# Patient Record
Sex: Female | Born: 1995 | Race: Black or African American | Hispanic: No | Marital: Single | Smoking: Former smoker
Health system: Southern US, Community
[De-identification: ages and names within clinical notes are randomized; demographics above are authoritative.]

## PROBLEM LIST (undated history)

## (undated) ENCOUNTER — Inpatient Hospital Stay (HOSPITAL_COMMUNITY): Payer: Self-pay

## (undated) DIAGNOSIS — R569 Unspecified convulsions: Secondary | ICD-10-CM

## (undated) DIAGNOSIS — J45909 Unspecified asthma, uncomplicated: Secondary | ICD-10-CM

## (undated) DIAGNOSIS — G43909 Migraine, unspecified, not intractable, without status migrainosus: Secondary | ICD-10-CM

## (undated) HISTORY — DX: Unspecified asthma, uncomplicated: J45.909

## (undated) HISTORY — PX: OTHER SURGICAL HISTORY: SHX169

## (undated) HISTORY — DX: Unspecified convulsions: R56.9

---
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
## 2014-05-06 ENCOUNTER — Encounter (HOSPITAL_COMMUNITY): Payer: Self-pay | Admitting: Emergency Medicine

## 2014-05-06 ENCOUNTER — Emergency Department (INDEPENDENT_AMBULATORY_CARE_PROVIDER_SITE_OTHER)
Admission: EM | Admit: 2014-05-06 | Discharge: 2014-05-06 | Disposition: A | Discharge status: Home / Self Care | Payer: Medicaid Other | Source: Home / Self Care | Attending: Family Medicine | Admitting: Family Medicine

## 2014-05-06 DIAGNOSIS — G44229 Chronic tension-type headache, not intractable: Secondary | ICD-10-CM

## 2014-05-06 LAB — POCT URINALYSIS DIP (DEVICE)
BILIRUBIN URINE: NEGATIVE
GLUCOSE, UA: NEGATIVE mg/dL
KETONES UR: NEGATIVE mg/dL
Leukocytes, UA: NEGATIVE
Nitrite: NEGATIVE
Protein, ur: NEGATIVE mg/dL
SPECIFIC GRAVITY, URINE: 1.02 (ref 1.005–1.030)
UROBILINOGEN UA: 0.2 mg/dL (ref 0.0–1.0)
pH: 7 (ref 5.0–8.0)

## 2014-05-06 LAB — POCT PREGNANCY, URINE: PREG TEST UR: NEGATIVE

## 2014-05-06 NOTE — ED Notes (Addendum)
C/o headache and frequent urination.  Headache has been worse for the past 2 days.  And urinary symptoms have been present for 2 weeks

## 2014-05-06 NOTE — ED Provider Notes (Signed)
CSN: [id number]     Arrival date & time 05/06/14  1621 History   First MD Initiated Contact with Patient 05/06/14 1703     Chief Complaint  Patient presents with  . Headache  . Urinary Frequency   (Consider location/radiation/quality/duration/timing/severity/associated sxs/prior Treatment) Patient is a 18 y.o. female presenting with headaches. The history is provided by the patient.  Headache Pain location:  Generalized Radiates to:  Does not radiate Progression:  Worsening Chronicity:  Chronic (daily.) Similar to prior headaches: yes   Context: caffeine and emotional stress   Context comment:  Admits to xs caffeine, , is [employer]. Associated symptoms: no blurred vision, no cough, no fever, no loss of balance and no numbness     History reviewed. No pertinent past medical history. History reviewed. No pertinent past surgical history. No family history on file. History  Substance Use Topics  . Smoking status: Never Smoker   . Smokeless tobacco: Not on file  . Alcohol Use: No   OB History   Grav Para Term Preterm Abortions TAB SAB Ect Mult Living                 Review of Systems  Constitutional: Negative.  Negative for fever.  Eyes: Negative for blurred vision.  Respiratory: Negative for cough.   Cardiovascular: Negative.   Gastrointestinal: Negative.   Genitourinary: Positive for frequency. Negative for urgency.  Neurological: Positive for headaches. Negative for weakness, light-headedness, numbness and loss of balance.    Allergies  Review of patient's allergies indicates no known allergies.  Home Medications   Prior to Admission medications   Medication Sig Start Date End Date Taking? Authorizing Provider  aspirin-acetaminophen-caffeine (EXCEDRIN MIGRAINE) [phone] MG per tablet Take by mouth every 6 (six) hours as needed for headache.   Yes Historical Provider, MD  albuterol (PROVENTIL HFA;VENTOLIN HFA) 108 (90 BASE) MCG/ACT inhaler Inhale 2 puffs  into the lungs 4 (four) times daily. 02/22/13   Reuben Likesavid C Keller, MD  fexofenadine-pseudoephedrine (ALLEGRA-D) 60-120 MG per tablet Take 1 tablet by mouth every 12 (twelve) hours. 02/22/13   Reuben Likesavid C Keller, MD  fluticasone (FLONASE) 50 MCG/ACT nasal spray Place 2 sprays into the nose daily. 02/22/13   Reuben Likesavid C Keller, MD  ipratropium (ATROVENT) 0.06 % nasal spray Place 2 sprays into both nostrils 4 (four) times daily. 08/26/13   Linna HoffJames D Raydan Schlabach, MD  loratadine (CLARITIN) 10 MG tablet Take 1 tablet (10 mg total) by mouth daily. 12/11/12   Arley Pheniximothy M Galey, MD   BP 126/79  Pulse 76  Temp(Src) 99.8 F (37.7 C) (Oral)  Resp 14  SpO2 98% Physical Exam  Nursing note and vitals reviewed. Constitutional: She is oriented to person, place, and time. She appears well-developed and well-nourished.  HENT:  Right Ear: External ear normal.  Eyes: Conjunctivae and EOM are normal. Pupils are equal, round, and reactive to light.  Neck: Normal range of motion. Neck supple. No thyromegaly present.  Cardiovascular: Normal heart sounds.   Pulmonary/Chest: Breath sounds normal.  Lymphadenopathy:    She has no cervical adenopathy.  Neurological: She is alert and oriented to person, place, and time. No cranial nerve deficit. She exhibits normal muscle tone. Coordination normal.  Skin: Skin is warm and dry.    ED Course  Procedures (including critical care time) Labs Review Labs Reviewed  POCT URINALYSIS DIP (DEVICE) - Abnormal; Notable for the following:    Hgb urine dipstick MODERATE (*)    All other components within normal limits  POCT PREGNANCY, URINE    Imaging Review No results found.   MDM   1. Chronic tension-type headache, not intractable        Linna HoffJames D Alyanah Elliott, MD 05/06/14 [phone]

## 2014-05-06 NOTE — Discharge Instructions (Signed)
Reduce caffeine, increase water, get more sleep, see your doctor as needed.

## 2014-07-29 ENCOUNTER — Emergency Department (HOSPITAL_COMMUNITY)
Admission: EM | Admit: 2014-07-29 | Discharge: 2014-07-30 | Discharge status: Against Medical Advice | Payer: Medicaid Other | Attending: Emergency Medicine | Admitting: Emergency Medicine

## 2014-07-29 ENCOUNTER — Encounter (HOSPITAL_COMMUNITY): Payer: Self-pay | Admitting: Emergency Medicine

## 2014-07-29 DIAGNOSIS — R1030 Lower abdominal pain, unspecified: Secondary | ICD-10-CM | POA: Insufficient documentation

## 2014-07-29 DIAGNOSIS — N72 Inflammatory disease of cervix uteri: Secondary | ICD-10-CM | POA: Diagnosis not present

## 2014-07-29 LAB — CBC WITH DIFFERENTIAL/PLATELET
BASOS PCT: 0 % (ref 0–1)
Basophils Absolute: 0 10*3/uL (ref 0.0–0.1)
Eosinophils Absolute: 0.1 10*3/uL (ref 0.0–0.7)
Eosinophils Relative: 2 % (ref 0–5)
HCT: 39.6 % (ref 36.0–46.0)
HEMOGLOBIN: 13.1 g/dL (ref 12.0–15.0)
LYMPHS ABS: 3 10*3/uL (ref 0.7–4.0)
LYMPHS PCT: 44 % (ref 12–46)
MCH: 27.9 pg (ref 26.0–34.0)
MCHC: 33.1 g/dL (ref 30.0–36.0)
MCV: 84.3 fL (ref 78.0–100.0)
MONOS PCT: 9 % (ref 3–12)
Monocytes Absolute: 0.6 10*3/uL (ref 0.1–1.0)
NEUTROS ABS: 3 10*3/uL (ref 1.7–7.7)
NEUTROS PCT: 45 % (ref 43–77)
Platelets: 267 10*3/uL (ref 150–400)
RBC: 4.7 MIL/uL (ref 3.87–5.11)
RDW: 12.7 % (ref 11.5–15.5)
WBC: 6.8 10*3/uL (ref 4.0–10.5)

## 2014-07-29 LAB — COMPREHENSIVE METABOLIC PANEL
ALBUMIN: 4 g/dL (ref 3.5–5.2)
ALK PHOS: 54 U/L (ref 39–117)
ALT: 15 U/L (ref 0–35)
ANION GAP: 9 (ref 5–15)
AST: 21 U/L (ref 0–37)
BILIRUBIN TOTAL: 0.4 mg/dL (ref 0.3–1.2)
BUN: 14 mg/dL (ref 6–23)
CO2: 18 mmol/L — ABNORMAL LOW (ref 19–32)
Calcium: 9.3 mg/dL (ref 8.4–10.5)
Chloride: 111 mEq/L (ref 96–112)
Creatinine, Ser: 0.82 mg/dL (ref 0.50–1.10)
Glucose, Bld: 99 mg/dL (ref 70–99)
POTASSIUM: 4 mmol/L (ref 3.5–5.1)
Sodium: 138 mmol/L (ref 135–145)
TOTAL PROTEIN: 7 g/dL (ref 6.0–8.3)

## 2014-07-29 LAB — URINALYSIS, ROUTINE W REFLEX MICROSCOPIC
BILIRUBIN URINE: NEGATIVE
Glucose, UA: NEGATIVE mg/dL
KETONES UR: NEGATIVE mg/dL
LEUKOCYTES UA: NEGATIVE
Nitrite: NEGATIVE
PH: 7 (ref 5.0–8.0)
Protein, ur: NEGATIVE mg/dL
Specific Gravity, Urine: 1.029 (ref 1.005–1.030)
UROBILINOGEN UA: 1 mg/dL (ref 0.0–1.0)

## 2014-07-29 LAB — URINE MICROSCOPIC-ADD ON

## 2014-07-29 LAB — PREGNANCY, URINE: PREG TEST UR: NEGATIVE

## 2014-07-29 NOTE — ED Notes (Addendum)
No answer in waiting room.  Called pt on phone and she had went to get something to eat due to having 7 people in front of her.  Pt states she is returning at this time.

## 2014-07-29 NOTE — ED Notes (Signed)
Pt. reports low abdominal pain with  urinary frequency onset 2 days ago , denies nausea or vomitting , no fever or diarrhea.

## 2014-07-29 NOTE — ED Notes (Signed)
No answer in waiting area.

## 2014-07-30 ENCOUNTER — Emergency Department (HOSPITAL_COMMUNITY)
Admission: EM | Admit: 2014-07-30 | Discharge: 2014-07-30 | Disposition: A | Discharge status: Home / Self Care | Payer: Medicaid Other | Attending: Emergency Medicine | Admitting: Emergency Medicine

## 2014-07-30 ENCOUNTER — Encounter (HOSPITAL_COMMUNITY): Payer: Self-pay | Admitting: Emergency Medicine

## 2014-07-30 DIAGNOSIS — R350 Frequency of micturition: Secondary | ICD-10-CM | POA: Insufficient documentation

## 2014-07-30 DIAGNOSIS — Z79899 Other long term (current) drug therapy: Secondary | ICD-10-CM | POA: Insufficient documentation

## 2014-07-30 DIAGNOSIS — R509 Fever, unspecified: Secondary | ICD-10-CM | POA: Diagnosis not present

## 2014-07-30 DIAGNOSIS — Z7951 Long term (current) use of inhaled steroids: Secondary | ICD-10-CM | POA: Insufficient documentation

## 2014-07-30 DIAGNOSIS — Z7982 Long term (current) use of aspirin: Secondary | ICD-10-CM | POA: Diagnosis not present

## 2014-07-30 DIAGNOSIS — N72 Inflammatory disease of cervix uteri: Secondary | ICD-10-CM | POA: Insufficient documentation

## 2014-07-30 DIAGNOSIS — R1030 Lower abdominal pain, unspecified: Secondary | ICD-10-CM | POA: Diagnosis present

## 2014-07-30 LAB — WET PREP, GENITAL
TRICH WET PREP: NONE SEEN
Yeast Wet Prep HPF POC: NONE SEEN

## 2014-07-30 MED ORDER — DOXYCYCLINE HYCLATE 100 MG PO CAPS: 100.0000 mg | ORAL_CAPSULE | Freq: Two times a day (BID) | ORAL | Status: DC

## 2014-07-30 MED ORDER — CEFTRIAXONE SODIUM 250 MG IJ SOLR
250.0000 mg | Freq: Once | INTRAMUSCULAR | Status: AC
  Administered 2014-07-30: 250 mg via INTRAMUSCULAR
  Filled 2014-07-30: qty 250

## 2014-07-30 MED ORDER — LIDOCAINE HCL (PF) 1 % IJ SOLN
5.0000 mL | Freq: Once | INTRAMUSCULAR | Status: AC
  Administered 2014-07-30: 5 mL
  Filled 2014-07-30: qty 5

## 2014-07-30 NOTE — ED Notes (Signed)
No answer in waiting room.  Pt had been informed to let nurse know when she returned.

## 2014-07-30 NOTE — ED Provider Notes (Signed)
CSN: [id number]     Arrival date & time 07/30/14  0243 History  This chart was scribed for Loren Raceravid Reinhold Rickey, MD by Annye AsaAnna Dorsett, ED Scribe. This patient was seen in room A06C/A06C and the patient's care was started at 2:47 AM.    Chief Complaint  Patient presents with  . Abdominal Pain   HPI   HPI Comments: Natasha Martinez is a 19 y.o. female who presents to the Emergency Department complaining of 4 days of intermittent low abdominal pain. She also reports increased urinary frequency but denies dysuria, urgency or hematuria.. Nothing makes symptoms better or worse. Denies vaginal bleeding or discharge.  Patient recently had a "cold" with fevers and chills, but this has resolved at present.   History reviewed. No pertinent past medical history. History reviewed. No pertinent past surgical history. No family history on file. History  Substance Use Topics  . Smoking status: Never Smoker   . Smokeless tobacco: Not on file  . Alcohol Use: No   OB History    No data available     Review of Systems  Constitutional: Positive for fever. Negative for chills.  Respiratory: Negative for cough and shortness of breath.   Gastrointestinal: Positive for abdominal pain. Negative for nausea, vomiting, diarrhea and constipation.  Genitourinary: Positive for frequency. Negative for dysuria, hematuria, flank pain, vaginal bleeding, vaginal discharge, difficulty urinating and pelvic pain.  Musculoskeletal: Negative for back pain, neck pain and neck stiffness.  Neurological: Negative for dizziness, weakness, light-headedness, numbness and headaches.  All other systems reviewed and are negative.     Allergies  Review of patient's allergies indicates no known allergies.  Home Medications   Prior to Admission medications   Medication Sig Start Date End Date Taking? Authorizing Provider  albuterol (PROVENTIL HFA;VENTOLIN HFA) 108 (90 BASE) MCG/ACT inhaler Inhale 2 puffs into the lungs 4 (four) times  daily. Patient taking differently: Inhale 2 puffs into the lungs every 6 (six) hours as needed for wheezing or shortness of breath.  02/22/13  Yes Reuben Likesavid C Keller, MD  aspirin-acetaminophen-caffeine (EXCEDRIN MIGRAINE) [phone] MG per tablet Take 1-2 tablets by mouth every 6 (six) hours as needed for headache.    Yes Historical Provider, MD  fluticasone (FLONASE) 50 MCG/ACT nasal spray Place 2 sprays into the nose daily. Patient taking differently: Place 2 sprays into the nose daily as needed for allergies.  02/22/13  Yes Reuben Likesavid C Keller, MD  ipratropium (ATROVENT) 0.06 % nasal spray Place 2 sprays into both nostrils 4 (four) times daily. Patient taking differently: Place 2 sprays into both nostrils 4 (four) times daily as needed for rhinitis.  08/26/13  Yes Linna HoffJames D Kindl, MD  loratadine (CLARITIN) 10 MG tablet Take 1 tablet (10 mg total) by mouth daily. Patient taking differently: Take 10 mg by mouth daily as needed for allergies.  12/11/12  Yes Arley Pheniximothy M Galey, MD  medroxyPROGESTERone (DEPO-PROVERA) 150 MG/ML injection Inject 150 mg into the muscle every 3 (three) months.   Yes Historical Provider, MD  doxycycline (VIBRAMYCIN) 100 MG capsule Take 1 capsule (100 mg total) by mouth 2 (two) times daily. One po bid x 7 days 07/30/14   Loren Raceravid Nysha Koplin, MD  fexofenadine-pseudoephedrine (ALLEGRA-D) 60-120 MG per tablet Take 1 tablet by mouth every 12 (twelve) hours. Patient not taking: Reported on 07/30/2014 02/22/13   Reuben Likesavid C Keller, MD   BP 106/66 mmHg  Pulse 79  Temp(Src) 99.2 F (37.3 C) (Oral)  Resp 20  SpO2 98% Physical Exam  Constitutional:  She is oriented to person, place, and time. She appears well-developed and well-nourished. No distress.  HENT:  Head: Normocephalic and atraumatic.  Mouth/Throat: Oropharynx is clear and moist.  Eyes: EOM are normal. Pupils are equal, round, and reactive to light.  Neck: Normal range of motion. Neck supple.  Cardiovascular: Normal rate and regular rhythm.    Pulmonary/Chest: Effort normal and breath sounds normal. No respiratory distress. She has no wheezes. She has no rales.  Abdominal: Soft. Bowel sounds are normal. She exhibits no distension and no mass. There is tenderness (very mild suprapubic tenderness with palpation. There is no rebound or guarding.). There is no rebound and no guarding.  Genitourinary:  Purulent discharge coming from the cervical os. No cervical motion tenderness. No adnexal tenderness.  Musculoskeletal: Normal range of motion. She exhibits no edema or tenderness.  No CVA tenderness bilaterally.  Neurological: She is alert and oriented to person, place, and time.  Skin: Skin is warm and dry. No rash noted. No erythema.  Psychiatric: She has a normal mood and affect. Her behavior is normal.  Nursing note and vitals reviewed.   ED Course  Procedures   DIAGNOSTIC STUDIES: Oxygen Saturation is 99% on RA, normal by my interpretation.    COORDINATION OF CARE: 2:50 AM Discussed treatment plan with pt at bedside and pt agreed to plan.   Labs Review Labs Reviewed  WET PREP, GENITAL - Abnormal; Notable for the following:    Clue Cells Wet Prep HPF POC FEW (*)    WBC, Wet Prep HPF POC MODERATE (*)    All other components within normal limits  GC/CHLAMYDIA PROBE AMP    Imaging Review No results found.   EKG Interpretation None      MDM   Final diagnoses:  Cervicitis    I personally performed the services described in this documentation, which was scribed in my presence. The recorded information has been reviewed and considered.  Advised to have all sexual partners treated and follow-up with health Department. Return precautions given.   Loren Racer, MD 07/30/14 [phone]

## 2014-07-30 NOTE — Discharge Instructions (Signed)
Cervicitis °Cervicitis is a soreness and swelling (inflammation) of the cervix. Your cervix is located at the bottom of your uterus. It opens up to the vagina. °CAUSES  °· Sexually transmitted infections (STIs).   °· Allergic reaction.   °· Medicines or birth control devices that are put in the vagina.   °· Injury to the cervix.   °· Bacterial infections.   °RISK FACTORS °You are at greater risk if you: °· Have unprotected sexual intercourse. °· Have sexual intercourse with many partners. °· Began sexual intercourse at an early age. °· Have a history of STIs. °SYMPTOMS  °There may be no symptoms. If symptoms occur, they may include:  °· Gray, white, yellow, or bad-smelling vaginal discharge.   °· Pain or itching of the area outside the vagina.   °· Painful sexual intercourse.   °· Lower abdominal or lower back pain, especially during intercourse.   °· Frequent urination.   °· Abnormal vaginal bleeding between periods, after sexual intercourse, or after menopause.   °· Pressure or a heavy feeling in the pelvis.   °DIAGNOSIS  °Diagnosis is made after a pelvic exam. Other tests may include:  °· Examination of any discharge under a microscope (wet prep).   °· A Pap test.   °TREATMENT  °Treatment will depend on the cause of cervicitis. If it is caused by an STI, both you and your partner will need to be treated. Antibiotic medicines will be given.  °HOME CARE INSTRUCTIONS  °· Do not have sexual intercourse until your health care provider says it is okay.   °· Do not have sexual intercourse until your partner has been treated, if your cervicitis is caused by an STI.   °· Take your antibiotics as directed. Finish them even if you start to feel better.   °SEEK MEDICAL CARE IF: °· Your symptoms come back.   °· You have a fever.   °MAKE SURE YOU:  °· Understand these instructions. °· Will watch your condition. °· Will get help right away if you are not doing well or get worse. °Document Released: 07/09/2005 Document Revised:  07/14/2013 Document Reviewed: 12/31/2012 °ExitCare® Patient Information ©2015 [employer]. This information is not intended to replace advice given to you by your health care provider. Make sure you discuss any questions you have with your health care provider. ° °

## 2014-07-30 NOTE — ED Notes (Signed)
Pt. reports low abdominal pain with urinary frequency for 2 days, pt. registered and triaged last night and left while waiting for room , blood tests and urine tests done while at triage .

## 2014-07-30 NOTE — ED Notes (Signed)
Pt has not returned

## 2014-07-31 LAB — GC/CHLAMYDIA PROBE AMP
CT Probe RNA: NEGATIVE
GC PROBE AMP APTIMA: NEGATIVE

## 2014-08-05 ENCOUNTER — Telehealth: Payer: Medicaid Other | Admitting: Physician Assistant

## 2014-08-05 DIAGNOSIS — O2340 Unspecified infection of urinary tract in pregnancy, unspecified trimester: Secondary | ICD-10-CM

## 2014-08-05 NOTE — Progress Notes (Signed)
We are sorry that you are not feeling well.  Here is how we plan to help!  Based on what you shared with me it looks like you will need a face to face visit for complicated symptoms that could represent a serious infection.  You have already been treated without success and have concerns of pregnancy.  You need a face-to-face encounter with a medical provider for examination, urine testing and pregnancy testing.  If you are having a true medical emergency please call 911.  If you need an urgent face to face visit, [facility] has four urgent care centers for your convenience.  Tressie Ellis. [facility]  [phone] Get Driving Directions Find a Provider at this Location  [address] [place], [place] [address] . 8 am to 8 pm Monday-Friday . 9 am to 7 pm Saturday-Sunday  . [facility]  [phone] Get Driving Directions Find a Provider at this Location  [address] [place], [place] [address] . 8 am to 8 pm Monday-Friday . 9 am to 6 pm Saturday . 11 am to 6 pm Sunday   . [facility] [place]  [phone] Get Driving Directions  [address] [place], [place] [address] . 8 am to 8 pm Monday-Friday . 9 am to 4 pm Saturday-Sunday   . [facility] (a walk in primary care provider)  [phone]  Get Driving Directions Find a Provider at this Location  [address] [place], [place] [address] . 8 am to 8:30 pm Monday-Thursday . 8 am to 6 pm Friday . 8 am to 4 pm Saturday-Sunday  Your e-visit answers were reviewed by a board certified advanced clinical practitioner to complete your personal care plan.  Depending on the condition, your plan could have included both over the counter or prescription medications.  You will get an e-mail in the next two days asking about your experience.  I hope that your e-visit has been valuable and will speed your recovery . Thank you  for choosing an e-visit.

## 2014-08-26 ENCOUNTER — Emergency Department (INDEPENDENT_AMBULATORY_CARE_PROVIDER_SITE_OTHER)
Admission: EM | Admit: 2014-08-26 | Discharge: 2014-08-26 | Disposition: A | Discharge status: Home / Self Care | Payer: Medicaid Other | Source: Home / Self Care | Attending: Family Medicine | Admitting: Family Medicine

## 2014-08-26 ENCOUNTER — Encounter (HOSPITAL_COMMUNITY): Payer: Self-pay | Admitting: Emergency Medicine

## 2014-08-26 DIAGNOSIS — L42 Pityriasis rosea: Secondary | ICD-10-CM

## 2014-08-26 MED ORDER — TRIAMCINOLONE ACETONIDE 0.1 % EX CREA: 1.0000 "application " | TOPICAL_CREAM | Freq: Two times a day (BID) | CUTANEOUS | Status: DC

## 2014-08-26 NOTE — ED Provider Notes (Signed)
Natasha Martinez is a 19 y.o. female who presents to Urgent Care today for rash. Patient developed a rash on her trunk and upper extremities present for about one week. She initially had a patch on her abdomen for the rest of the rash started. She notes the rash is mildly pruritic. No new soaps detergents or shampoos. No medications. No trouble breathing or oral lesions or tongue swelling. She has tried some over-the-counter steroid cream which has helped only a little.   History reviewed. No pertinent past medical history. History reviewed. No pertinent past surgical history. History  Substance Use Topics  . Smoking status: Never Smoker   . Smokeless tobacco: Not on file  . Alcohol Use: No   ROS as above Medications: No current facility-administered medications for this encounter.   Current Outpatient Prescriptions  Medication Sig Dispense Refill  . albuterol (PROVENTIL HFA;VENTOLIN HFA) 108 (90 BASE) MCG/ACT inhaler Inhale 2 puffs into the lungs 4 (four) times daily. (Patient taking differently: Inhale 2 puffs into the lungs every 6 (six) hours as needed for wheezing or shortness of breath. ) 1 Inhaler 0  . aspirin-acetaminophen-caffeine (EXCEDRIN MIGRAINE) 250-250-65 MG per tablet Take 1-2 tablets by mouth every 6 (six) hours as needed for headache.     . fexofenadine-pseudoephedrine (ALLEGRA-D) 60-120 MG per tablet Take 1 tablet by mouth every 12 (twelve) hours. (Patient not taking: Reported on 07/30/2014) 30 tablet 0  . fluticasone (FLONASE) 50 MCG/ACT nasal spray Place 2 sprays into the nose daily. (Patient taking differently: Place 2 sprays into the nose daily as needed for allergies. ) 16 g 0  . ipratropium (ATROVENT) 0.06 % nasal spray Place 2 sprays into both nostrils 4 (four) times daily. (Patient taking differently: Place 2 sprays into both nostrils 4 (four) times daily as needed for rhinitis. ) 15 mL 1  . loratadine (CLARITIN) 10 MG tablet Take 1 tablet (10 mg total) by mouth daily.  (Patient taking differently: Take 10 mg by mouth daily as needed for allergies. ) 30 tablet 0  . medroxyPROGESTERone (DEPO-PROVERA) 150 MG/ML injection Inject 150 mg into the muscle every 3 (three) months.    . triamcinolone cream (KENALOG) 0.1 % Apply 1 application topically 2 (two) times daily. 60 g 2   No Known Allergies   Exam:  BP 114/68 mmHg  Pulse 73  Temp(Src) 98.2 F (36.8 C) (Oral)  Resp 16  SpO2 100% Gen: Well NAD HEENT: EOMI,  MMM no oral lesions Lungs: Normal work of breathing. CTABL Heart: RRR no MRG Abd: NABS, Soft. Nondistended, Nontender Exts: Brisk capillary refill, warm and well perfused.  Skin: Multiple small patches of mildly erythematous mildly scaly lesions on trunk and extremities and a Christmas tree pattern.  No results found for this or any previous visit (from the past 24 hour(s)). No results found.  Assessment and Plan: 19 y.o. female with pityriasis rosea. Treat with triamcinolone cream. Watchful waiting. Follow-up as needed.  Discussed warning signs or symptoms. Please see discharge instructions. Patient expresses understanding.     Rodolph BongEvan S Terry [place], MD 08/26/14 [phone]

## 2014-08-26 NOTE — ED Notes (Signed)
C/o   "Breaking out"   Reports rash on arms, abdomen,and chest.  On set 1 wk ago.  No relief with otc creams. Mild irritation.

## 2014-08-26 NOTE — Discharge Instructions (Signed)
Thank you for coming in today. ° ° °Pityriasis Rosea °Pityriasis rosea is a rash which is probably caused by a virus. It generally starts as a scaly, red patch on the trunk (the area of the body that a t-shirt would cover) but does not appear on sun exposed areas. The rash is usually preceded by an initial larger spot called the "herald patch" a week or more before the rest of the rash appears. Generally within one to two days the rash appears rapidly on the trunk, upper arms, and sometimes the upper legs. The rash usually appears as flat, oval patches of scaly pink color. The rash can also be raised and one is able to feel it with a finger. The rash can also be finely crinkled and may slough off leaving a ring of scale around the spot. Sometimes a mild sore throat is present with the rash. It usually affects children and young adults in the spring and autumn. Women are more frequently affected than men. °TREATMENT  °Pityriasis rosea is a self-limited condition. This means it goes away within 4 to 8 weeks without treatment. The spots may persist for several months, especially in darker-colored skin after the rash has resolved and healed. Benadryl and steroid creams may be used if itching is a problem. °SEEK MEDICAL CARE IF:  °· Your rash does not go away or persists longer than three months. °· You develop fever and joint pain. °· You develop severe headache and confusion. °· You develop breathing difficulty, vomiting and/or extreme weakness. °Document Released: 08/15/2001 Document Revised: 10/01/2011 Document Reviewed: 09/03/2008 °ExitCare® Patient Information ©2015 [employer]. This information is not intended to replace advice given to you by your health care provider. Make sure you discuss any questions you have with your health care provider. ° °

## 2014-11-02 ENCOUNTER — Encounter (HOSPITAL_COMMUNITY): Payer: Self-pay | Admitting: Emergency Medicine

## 2014-11-02 ENCOUNTER — Emergency Department (INDEPENDENT_AMBULATORY_CARE_PROVIDER_SITE_OTHER)
Admission: EM | Admit: 2014-11-02 | Discharge: 2014-11-02 | Disposition: A | Discharge status: Home / Self Care | Payer: Medicaid Other | Source: Home / Self Care | Attending: Family Medicine | Admitting: Family Medicine

## 2014-11-02 ENCOUNTER — Emergency Department (HOSPITAL_COMMUNITY)
Admission: EM | Admit: 2014-11-02 | Discharge: 2014-11-02 | Discharge status: Against Medical Advice | Payer: Medicaid Other

## 2014-11-02 DIAGNOSIS — R1030 Lower abdominal pain, unspecified: Secondary | ICD-10-CM | POA: Diagnosis not present

## 2014-11-02 DIAGNOSIS — Z0000 Encounter for general adult medical examination without abnormal findings: Secondary | ICD-10-CM

## 2014-11-02 LAB — POCT URINALYSIS DIP (DEVICE)
Bilirubin Urine: NEGATIVE
Glucose, UA: NEGATIVE mg/dL
Hgb urine dipstick: NEGATIVE
Ketones, ur: NEGATIVE mg/dL
Leukocytes, UA: NEGATIVE
Nitrite: NEGATIVE
PROTEIN: NEGATIVE mg/dL
Specific Gravity, Urine: 1.02 (ref 1.005–1.030)
UROBILINOGEN UA: 0.2 mg/dL (ref 0.0–1.0)
pH: 6 (ref 5.0–8.0)

## 2014-11-02 LAB — POCT PREGNANCY, URINE: PREG TEST UR: NEGATIVE

## 2014-11-02 NOTE — ED Notes (Signed)
Pt called in main ED waiting area with no response; NF aware

## 2014-11-02 NOTE — ED Notes (Signed)
PT. Called for triage X1 no answer

## 2014-11-02 NOTE — ED Provider Notes (Signed)
CSN: [id number]     Arrival date & time 11/02/14  1448 History   First MD Initiated Contact with Patient 11/02/14 1545     Chief Complaint  Patient presents with  . Abdominal Pain   (Consider location/radiation/quality/duration/timing/severity/associated sxs/prior Treatment) HPI Comments: Nonsmoker No ETOH or NSAID use Works at [employer]: ?? on [employer] she is not sexually active PCP: [facility] At the time of today's visit, she reports herself to be without symptom. States she had discomfort earlier today and went to be seen in the ER, but wait was too long so she left and came to [facility] to be seen.   Patient is a 19 y.o. female presenting with abdominal pain. The history is provided by the patient.  Abdominal Pain Pain location:  Suprapubic Pain quality: aching   Pain radiates to:  Does not radiate Pain severity:  Mild Onset quality:  Gradual Duration:  2 weeks Timing:  Intermittent Progression:  Waxing and waning (no discomfort at time of today's visit) Associated symptoms: no anorexia, no chest pain, no chills, no constipation, no cough, no diarrhea, no dysuria, no fatigue, no fever, no flatus, no hematemesis, no hematochezia, no hematuria, no melena, no nausea, no shortness of breath, no sore throat, no vaginal bleeding, no vaginal discharge and no vomiting   Risk factors: has not had multiple surgeries, no NSAID use and not pregnant     History reviewed. No pertinent past medical history. History reviewed. No pertinent past surgical history. No family history on file. History  Substance Use Topics  . Smoking status: Never Smoker   . Smokeless tobacco: Not on file  . Alcohol Use: No   OB History    No data available     Review of Systems  Constitutional: Negative for fever, chills, activity change, appetite change and fatigue.  HENT: Negative.  Negative for sore throat.   Respiratory: Negative for cough, chest tightness and shortness of breath.    Cardiovascular: Negative for chest pain.  Gastrointestinal: Positive for abdominal pain. Negative for nausea, vomiting, diarrhea, constipation, blood in stool, melena, hematochezia, abdominal distention, anal bleeding, rectal pain, anorexia, flatus and hematemesis.  Endocrine: Negative for polydipsia, polyphagia and polyuria.  Genitourinary: Positive for pelvic pain. Negative for dysuria, urgency, frequency, hematuria, flank pain, decreased urine volume, vaginal bleeding, vaginal discharge, vaginal pain and menstrual problem.  Musculoskeletal: Negative for back pain.  Skin: Negative.     Allergies  Review of patient's allergies indicates no known allergies.  Home Medications   Prior to Admission medications   Medication Sig Start Date End Date Taking? Authorizing Provider  albuterol (PROVENTIL HFA;VENTOLIN HFA) 108 (90 BASE) MCG/ACT inhaler Inhale 2 puffs into the lungs 4 (four) times daily. Patient taking differently: Inhale 2 puffs into the lungs every 6 (six) hours as needed for wheezing or shortness of breath.  02/22/13   Reuben Likes, MD  aspirin-acetaminophen-caffeine (EXCEDRIN MIGRAINE) [phone] MG per tablet Take 1-2 tablets by mouth every 6 (six) hours as needed for headache.     Historical Provider, MD  fexofenadine-pseudoephedrine (ALLEGRA-D) 60-120 MG per tablet Take 1 tablet by mouth every 12 (twelve) hours. Patient not taking: Reported on 07/30/2014 02/22/13   Reuben Likes, MD  fluticasone [facility]) 50 MCG/ACT nasal spray Place 2 sprays into the nose daily. Patient taking differently: Place 2 sprays into the nose daily as needed for allergies.  02/22/13   Reuben Likes, MD  ipratropium (ATROVENT) 0.06 % nasal spray Place 2 sprays into both  nostrils 4 (four) times daily. Patient taking differently: Place 2 sprays into both nostrils 4 (four) times daily as needed for rhinitis.  08/26/13   Linna HoffJames D Kindl, MD  loratadine (CLARITIN) 10 MG tablet Take 1 tablet (10 mg total) by mouth  daily. Patient taking differently: Take 10 mg by mouth daily as needed for allergies.  12/11/12   Marcellina Millinimothy Galey, MD  medroxyPROGESTERone (DEPO-PROVERA) 150 MG/ML injection Inject 150 mg into the muscle every 3 (three) months.    Historical Provider, MD  triamcinolone cream (KENALOG) 0.1 % Apply 1 application topically 2 (two) times daily. 08/26/14   Rodolph BongEvan S Corey, MD   BP 93/58 mmHg  Pulse 78  Temp(Src) 98.8 F (37.1 C) (Oral)  Resp 16  SpO2 98% Physical Exam  Constitutional: She is oriented to person, place, and time. She appears well-developed and well-nourished.  HENT:  Head: Normocephalic and atraumatic.  Eyes: Conjunctivae are normal. No scleral icterus.  Cardiovascular: Normal rate, regular rhythm and normal heart sounds.   Pulmonary/Chest: Effort normal and breath sounds normal. No respiratory distress. She has no wheezes.  Abdominal: Soft. Normal appearance and bowel sounds are normal. There is no tenderness. There is no rigidity, no rebound, no guarding and no CVA tenderness.  Musculoskeletal: Normal range of motion.  Neurological: She is alert and oriented to person, place, and time.  Skin: Skin is warm and dry. No rash noted. No erythema.  Psychiatric: She has a normal mood and affect.  Nursing note and vitals reviewed.   ED Course  Procedures (including critical care time) Labs Review Labs Reviewed  POCT PREGNANCY, URINE  POCT URINALYSIS DIP (DEVICE)    Imaging Review No results found.   MDM   1. Normal physical exam    UA normal. UPT negative Exam normal and unremarkable Vital signs normal.  Advised to contact her PCP at [facility] for further eval if symptoms continue to occur. Advised to be seen at her nearest ER should symptoms become suddenly worse, persistent or severe.    Ria ClockJennifer Lee H Elin Seats, [place]PA 11/02/14 1626

## 2014-11-02 NOTE — Discharge Instructions (Signed)
Medical Screening Exam °A medical screening exam has been done. This exam helps find the cause of your problem and determines whether you need emergency treatment. Your exam has shown that you do not need emergency treatment at this point. It is safe for you to go to your caregiver's office or clinic for treatment. You should make an appointment today to see your caregiver as soon as he or she is available. °Depending on your illness, your symptoms and condition can change over time. If your condition gets worse or you develop new or troubling symptoms before you see your caregiver, you should return to the emergency department for further evaluation.  °Document Released: 08/16/2004 Document Revised: 10/01/2011 Document Reviewed: 03/28/2011 °ExitCare® Patient Information ©2015 [employer]. This information is not intended to replace advice given to you by your health care provider. Make sure you discuss any questions you have with your health care provider. ° °

## 2014-11-02 NOTE — ED Notes (Addendum)
C/o low abdominal pain, onset 2 weeks ago, intermittent.  Denies burning with urination.  Reports increase in urinary frequency. Denies vaginal discharge.

## 2015-04-09 ENCOUNTER — Encounter (HOSPITAL_COMMUNITY): Payer: Self-pay | Admitting: Nurse Practitioner

## 2015-04-09 ENCOUNTER — Emergency Department (HOSPITAL_COMMUNITY)
Admission: EM | Admit: 2015-04-09 | Discharge: 2015-04-09 | Disposition: A | Discharge status: Home / Self Care | Payer: Medicaid Other | Attending: Emergency Medicine | Admitting: Emergency Medicine

## 2015-04-09 DIAGNOSIS — R519 Headache, unspecified: Secondary | ICD-10-CM

## 2015-04-09 DIAGNOSIS — R51 Headache: Secondary | ICD-10-CM | POA: Insufficient documentation

## 2015-04-09 DIAGNOSIS — Z7982 Long term (current) use of aspirin: Secondary | ICD-10-CM | POA: Insufficient documentation

## 2015-04-09 DIAGNOSIS — Z7951 Long term (current) use of inhaled steroids: Secondary | ICD-10-CM | POA: Insufficient documentation

## 2015-04-09 DIAGNOSIS — Z79899 Other long term (current) drug therapy: Secondary | ICD-10-CM | POA: Diagnosis not present

## 2015-04-09 MED ORDER — IBUPROFEN 800 MG PO TABS
800.0000 mg | ORAL_TABLET | Freq: Three times a day (TID) | ORAL | Status: DC | PRN
Start: 2015-04-09 — End: 2016-05-17

## 2015-04-09 MED ORDER — AMOXICILLIN-POT CLAVULANATE 875-125 MG PO TABS: 1.0000 | ORAL_TABLET | Freq: Two times a day (BID) | ORAL | Status: DC

## 2015-04-09 MED ORDER — KETOROLAC TROMETHAMINE 30 MG/ML IJ SOLN
30.0000 mg | Freq: Once | INTRAMUSCULAR | Status: AC
  Administered 2015-04-09: 30 mg via INTRAVENOUS
  Filled 2015-04-09: qty 1

## 2015-04-09 MED ORDER — GUAIFENESIN ER 1200 MG PO TB12: 1.0000 | ORAL_TABLET | Freq: Two times a day (BID) | ORAL | Status: DC

## 2015-04-09 MED ORDER — SODIUM CHLORIDE 0.9 % IV BOLUS (SEPSIS)
1000.0000 mL | Freq: Once | INTRAVENOUS | Status: AC
  Administered 2015-04-09: 1000 mL via INTRAVENOUS

## 2015-04-09 MED ORDER — DEXAMETHASONE SODIUM PHOSPHATE 10 MG/ML IJ SOLN
10.0000 mg | Freq: Once | INTRAMUSCULAR | Status: AC
  Administered 2015-04-09: 10 mg via INTRAVENOUS
  Filled 2015-04-09: qty 1

## 2015-04-09 NOTE — ED Notes (Signed)
She c/o severe headaches intermittently over past month. She has tried excedrin with some relief of pain but then headache returns. Headaches make her feel nauseated. She denies head injury, vision chnges, loc. A&Ox4

## 2015-04-09 NOTE — ED Notes (Signed)
Pts friend brought fast food from [place] and patient sitting in bed eating.

## 2015-04-09 NOTE — Discharge Instructions (Signed)
Return here as needed.  Followup with a primary care Dr. for recheck. °

## 2015-04-09 NOTE — ED Provider Notes (Signed)
CSN: [id number]     Arrival date & time 04/09/15  1327 History   First MD Initiated Contact with Patient 04/09/15 1502     Chief Complaint  Patient presents with  . Headache     (Consider location/radiation/quality/duration/timing/severity/associated sxs/prior Treatment) HPI Patient presents to the emergency department with headache that is been ongoing for quite a while.  Patient states that she has had this ongoing headache for the last month and a half.  The patient states that she does have some sinus pressure associated with this.  She states she has also had nasal congestion.  She denies fever, nausea, vomiting, weakness, dizziness, blurred vision, back pain, neck pain, fever, chest pain, shortness of breath, abnormal gait, near syncope or syncope.  The patient states that she has not taken anything at home.  Other than Excedrin Migraine with minimal relief of her headache.  She states the headache has been somewhat intermittent History reviewed. No pertinent past medical history. History reviewed. No pertinent past surgical history. History reviewed. No pertinent family history. Social History  Substance Use Topics  . Smoking status: Never Smoker   . Smokeless tobacco: None  . Alcohol Use: No   OB History    No data available     Review of Systems All other systems negative except as documented in the HPI. All pertinent positives and negatives as reviewed in the HPI.   Allergies  Review of patient's allergies indicates no known allergies.  Home Medications   Prior to Admission medications   Medication Sig Start Date End Date Taking? Authorizing Provider  albuterol (PROVENTIL HFA;VENTOLIN HFA) 108 (90 BASE) MCG/ACT inhaler Inhale 2 puffs into the lungs 4 (four) times daily. Patient taking differently: Inhale 2 puffs into the lungs every 6 (six) hours as needed for wheezing or shortness of breath.  02/22/13  Yes Reuben Likes, MD  aspirin-acetaminophen-caffeine (EXCEDRIN  MIGRAINE) [phone] MG per tablet Take 1-2 tablets by mouth every 6 (six) hours as needed for headache.    Yes Historical Provider, MD  diphenhydrAMINE (BENADRYL) 25 MG tablet Take 25 mg by mouth every 6 (six) hours as needed for allergies.   Yes Historical Provider, MD  fexofenadine-pseudoephedrine (ALLEGRA-D) 60-120 MG per tablet Take 1 tablet by mouth every 12 (twelve) hours. Patient not taking: Reported on 07/30/2014 02/22/13   Reuben Likes, MD  fluticasone [facility]) 50 MCG/ACT nasal spray Place 2 sprays into the nose daily. Patient not taking: Reported on 04/09/2015 02/22/13   Reuben Likes, MD  ipratropium (ATROVENT) 0.06 % nasal spray Place 2 sprays into both nostrils 4 (four) times daily. Patient not taking: Reported on 04/09/2015 08/26/13   Linna Hoff, MD  loratadine (CLARITIN) 10 MG tablet Take 1 tablet (10 mg total) by mouth daily. Patient not taking: Reported on 04/09/2015 12/11/12   Marcellina Millin, MD  triamcinolone cream (KENALOG) 0.1 % Apply 1 application topically 2 (two) times daily. Patient not taking: Reported on 04/09/2015 08/26/14   Rodolph Bong, MD   BP 113/62 mmHg  Pulse 60  Temp(Src) 98.2 F (36.8 C) (Oral)  Resp 18  Ht  (1.6 m)  Wt 135 lb (61.236 kg)  BMI 23.92 kg/m2  SpO2 100%  LMP 04/08/2015 Physical Exam  HENT:  Head: Atraumatic. Macrocephalic.      ED Course  Procedures (including critical care time) Labs Review Labs Reviewed - No data to display Patient be treated for possible sinusitis and headache.  Told to return here as needed.  Patient agrees to plan and all questions were answered     Charlestine Night, PA-C 04/09/15 1811  Eber Hong, MD 04/10/15 [phone]

## 2015-07-03 ENCOUNTER — Emergency Department (HOSPITAL_COMMUNITY): Payer: Medicaid Other

## 2015-07-03 ENCOUNTER — Encounter (HOSPITAL_COMMUNITY): Payer: Self-pay | Admitting: Emergency Medicine

## 2015-07-03 ENCOUNTER — Emergency Department (HOSPITAL_COMMUNITY)
Admission: EM | Admit: 2015-07-03 | Discharge: 2015-07-03 | Disposition: A | Discharge status: Home / Self Care | Payer: Medicaid Other | Attending: Emergency Medicine | Admitting: Emergency Medicine

## 2015-07-03 DIAGNOSIS — R0981 Nasal congestion: Secondary | ICD-10-CM | POA: Diagnosis not present

## 2015-07-03 DIAGNOSIS — R110 Nausea: Secondary | ICD-10-CM | POA: Diagnosis not present

## 2015-07-03 DIAGNOSIS — R0982 Postnasal drip: Secondary | ICD-10-CM | POA: Diagnosis not present

## 2015-07-03 DIAGNOSIS — J3489 Other specified disorders of nose and nasal sinuses: Secondary | ICD-10-CM | POA: Insufficient documentation

## 2015-07-03 DIAGNOSIS — H539 Unspecified visual disturbance: Secondary | ICD-10-CM | POA: Diagnosis not present

## 2015-07-03 DIAGNOSIS — Z79899 Other long term (current) drug therapy: Secondary | ICD-10-CM | POA: Insufficient documentation

## 2015-07-03 DIAGNOSIS — R51 Headache: Secondary | ICD-10-CM | POA: Diagnosis present

## 2015-07-03 DIAGNOSIS — Z7982 Long term (current) use of aspirin: Secondary | ICD-10-CM | POA: Diagnosis not present

## 2015-07-03 DIAGNOSIS — Z792 Long term (current) use of antibiotics: Secondary | ICD-10-CM | POA: Diagnosis not present

## 2015-07-03 DIAGNOSIS — H53149 Visual discomfort, unspecified: Secondary | ICD-10-CM | POA: Diagnosis not present

## 2015-07-03 DIAGNOSIS — R519 Headache, unspecified: Secondary | ICD-10-CM

## 2015-07-03 MED ORDER — DIPHENHYDRAMINE HCL 25 MG PO CAPS
25.0000 mg | ORAL_CAPSULE | Freq: Once | ORAL | Status: AC
  Administered 2015-07-03: 25 mg via ORAL
  Filled 2015-07-03: qty 1

## 2015-07-03 MED ORDER — CETIRIZINE HCL 10 MG PO TABS: 10.0000 mg | ORAL_TABLET | Freq: Every day | ORAL | Status: DC

## 2015-07-03 MED ORDER — KETOROLAC TROMETHAMINE 30 MG/ML IJ SOLN
30.0000 mg | Freq: Once | INTRAMUSCULAR | Status: AC
  Administered 2015-07-03: 30 mg via INTRAMUSCULAR
  Filled 2015-07-03: qty 1

## 2015-07-03 NOTE — ED Provider Notes (Signed)
CSN: [id number]     Arrival date & time 07/03/15  1355 History   First MD Initiated Contact with Patient 07/03/15 1504     Chief Complaint  Patient presents with  . Nasal Congestion  . Headache  . Nausea     (Consider location/radiation/quality/duration/timing/severity/associated sxs/prior Treatment) HPI Comments: Patient presents to the ED with a chief complaint of headache.  She states that she has been having a constant daily headache that is worse in the morning for the past several months.  She reports associated blurred vision and nausea.  She denies any associated fevers or chills.  There are no aggravating or alleviating factors.  She has tried taking excedrin migraine with no relief.  She also complains of mild sinus congestion for the past couple of weeks.  She states that she has been treated for sinusitis with no relief.  She denies any other associated symptoms.  The history is provided by the patient. No language interpreter was used.    History reviewed. No pertinent past medical history. History reviewed. No pertinent past surgical history. No family history on file. Social History  Substance Use Topics  . Smoking status: Never Smoker   . Smokeless tobacco: None  . Alcohol Use: No   OB History    No data available     Review of Systems  Constitutional: Negative for fever and chills.  HENT: Positive for postnasal drip, rhinorrhea and sinus pressure.   Eyes: Positive for photophobia and visual disturbance.  Respiratory: Negative for shortness of breath.   Cardiovascular: Negative for chest pain.  Gastrointestinal: Negative for nausea, vomiting, diarrhea and constipation.  Genitourinary: Negative for dysuria.  Neurological: Positive for headaches.      Allergies  Review of patient's allergies indicates no known allergies.  Home Medications   Prior to Admission medications   Medication Sig Start Date End Date Taking? Authorizing Provider  albuterol  (PROVENTIL HFA;VENTOLIN HFA) 108 (90 BASE) MCG/ACT inhaler Inhale 2 puffs into the lungs 4 (four) times daily. Patient taking differently: Inhale 2 puffs into the lungs every 6 (six) hours as needed for wheezing or shortness of breath.  02/22/13   Reuben Likesavid C Keller, MD  amoxicillin-clavulanate (AUGMENTIN) 875-125 MG per tablet Take 1 tablet by mouth every 12 (twelve) hours. 04/09/15   Charlestine Nighthristopher Lawyer, PA-C  aspirin-acetaminophen-caffeine (EXCEDRIN MIGRAINE) [id number] MG per tablet Take 1-2 tablets by mouth every 6 (six) hours as needed for headache.     Historical Provider, MD  diphenhydrAMINE (BENADRYL) 25 MG tablet Take 25 mg by mouth every 6 (six) hours as needed for allergies.    Historical Provider, MD  fexofenadine-pseudoephedrine (ALLEGRA-D) 60-120 MG per tablet Take 1 tablet by mouth every 12 (twelve) hours. Patient not taking: Reported on 07/30/2014 02/22/13   Reuben Likesavid C Keller, MD  fluticasone [facility](FLONASE) 50 MCG/ACT nasal spray Place 2 sprays into the nose daily. Patient not taking: Reported on 04/09/2015 02/22/13   Reuben Likesavid C Keller, MD  Guaifenesin 1200 MG TB12 Take 1 tablet (1,200 mg total) by mouth 2 (two) times daily. 04/09/15   Charlestine Nighthristopher Lawyer, PA-C  ibuprofen (ADVIL,MOTRIN) 800 MG tablet Take 1 tablet (800 mg total) by mouth every 8 (eight) hours as needed. 04/09/15   Christopher Lawyer, PA-C  ipratropium (ATROVENT) 0.06 % nasal spray Place 2 sprays into both nostrils 4 (four) times daily. Patient not taking: Reported on 04/09/2015 08/26/13   Linna HoffJames D Kindl, MD  loratadine (CLARITIN) 10 MG tablet Take 1 tablet (10 mg total) by mouth daily.  Patient not taking: Reported on 04/09/2015 12/11/12   Marcellina Millin, MD  triamcinolone cream (KENALOG) 0.1 % Apply 1 application topically 2 (two) times daily. Patient not taking: Reported on 04/09/2015 08/26/14   Rodolph Bong, MD   BP 113/79 mmHg  Pulse 65  Temp(Src) 99.5 F (37.5 C) (Oral)  Resp 16  Ht  (1.6 m)  Wt 60.47 kg  BMI 23.62 kg/m2  SpO2  99% Physical Exam  Constitutional: She is oriented to person, place, and time. She appears well-developed and well-nourished.  HENT:  Head: Normocephalic and atraumatic.  Right Ear: External ear normal.  Left Ear: External ear normal.  Eyes: Conjunctivae and EOM are normal. Pupils are equal, round, and reactive to light.  Right Eye Near: R Near: 20/30 Left Eye Near:  L Near: 20/30 Bilateral Near:  20/30   Neck: Normal range of motion. Neck supple.  No pain with neck flexion, no meningismus  Cardiovascular: Normal rate, regular rhythm and normal heart sounds.  Exam reveals no gallop and no friction rub.   No murmur heard. Pulmonary/Chest: Effort normal and breath sounds normal. No respiratory distress. She has no wheezes. She has no rales. She exhibits no tenderness.  Abdominal: Soft. She exhibits no distension and no mass. There is no tenderness. There is no rebound and no guarding.  Musculoskeletal: Normal range of motion. She exhibits no edema or tenderness.  Normal gait.  Neurological: She is alert and oriented to person, place, and time. She has normal reflexes.  CN 3-12 intact, normal finger to nose, no pronator drift, sensation and strength intact bilaterally.  Skin: Skin is warm and dry.  Psychiatric: She has a normal mood and affect. Her behavior is normal. Judgment and thought content normal.  Nursing note and vitals reviewed.   ED Course  Procedures (including critical care time)  Imaging Review Ct Head Wo Contrast  07/03/2015  CLINICAL DATA:  Daily headaches for several months, history of seizure activity EXAM: CT HEAD WITHOUT CONTRAST TECHNIQUE: Contiguous axial images were obtained from the base of the skull through the vertex without intravenous contrast. COMPARISON:  None. FINDINGS: Bony calvarium is intact. No findings to suggest acute hemorrhage, acute infarction or space-occupying mass lesion are noted. IMPRESSION: No acute intracranial abnormality noted.  Electronically Signed   By: Alcide Clever M.D.   On: 07/03/2015 15:57   I have personally reviewed and evaluated these images and lab results as part of my medical decision-making.    MDM   Final diagnoses:  Nonintractable headache, unspecified chronicity pattern, unspecified headache type    Patient with constant daily headache x 3 months.  No fever.  Intermittent blurred vision.    Ct negative for mass or tumor.  Consider pseudotumor.  Discussed with Dr. Anitra Lauth and with Dr. Amada Jupiter from neurology.  Dr. Amada Jupiter recommends outpatient follow-up as symptoms are not constant.  Patient feels improved after toradol and benadryl.   DC to home with outpatient neurology follow-up.    Roxy Horseman, PA-C 07/03/15 1729  Gwyneth Sprout, MD 07/04/15 [phone]

## 2015-07-03 NOTE — ED Notes (Signed)
Pt stable, ambulatory, states understanding of discharge iinstructions 

## 2015-07-03 NOTE — ED Notes (Signed)
Pt c/o dizziness, lightheadedness and nasal congestion ongoing for a couple of weeks. Pt has tried ibuprofen for headaches. Pt reports nausea and blurred vision.

## 2015-07-03 NOTE — ED Notes (Signed)
Patient transported to CT 

## 2015-07-03 NOTE — Discharge Instructions (Signed)

## 2015-07-08 ENCOUNTER — Encounter: Payer: Self-pay | Admitting: Neurology

## 2015-08-04 ENCOUNTER — Encounter: Payer: Self-pay | Admitting: Neurology

## 2015-08-04 ENCOUNTER — Ambulatory Visit (INDEPENDENT_AMBULATORY_CARE_PROVIDER_SITE_OTHER): Payer: Medicaid Other | Admitting: Neurology

## 2015-08-04 VITALS — BP 118/74 | HR 91 | Ht 63.0 in | Wt 135.9 lb

## 2015-08-04 DIAGNOSIS — Z8669 Personal history of other diseases of the nervous system and sense organs: Secondary | ICD-10-CM | POA: Insufficient documentation

## 2015-08-04 DIAGNOSIS — G43709 Chronic migraine without aura, not intractable, without status migrainosus: Secondary | ICD-10-CM | POA: Insufficient documentation

## 2015-08-04 MED ORDER — TOPIRAMATE 25 MG PO TABS: ORAL_TABLET | ORAL | Status: DC

## 2015-08-04 MED ORDER — SUMATRIPTAN SUCCINATE 100 MG PO TABS: ORAL_TABLET | ORAL | Status: DC

## 2015-08-04 NOTE — Progress Notes (Signed)
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  History obtained by patient, her mother and several ED visit notes.  Labs and imaging of head CT reviewed.  Onset:  Since childhood, but has been daily for about one year Location:  Bi-frontal Quality:  Throbbing, pressure Intensity:  9/10 (sometimes 5/10) Aura:  no Prodrome:  no Associated symptoms:  Nausea, photophobia, phonophobia.  No vomiting or visual disturbance Duration:  Half a day.  Often wakes up with it. Frequency:  daily Triggers/exacerbating factors:  Bright lights Relieving factors:  Low-lights Activity:  Able to force self to function  Past abortive medication:  Ibuprofen 800mg  Past preventative medication:  none Other past therapy:  none  Current abortive medication:  Excedrin Migraine (takes daily) Antihypertensive medications:  none Antidepressant medications:  none Anticonvulsant medications:  none Vitamins/Herbal/Supplements:  none Other therapy:  none  She had a CT of the head performed on 07/03/15, which was normal. Recent CBC and CMP were unremarkable.  UA was negative for infection.  Caffeine:  Coffee or soda occasionally Alcohol:  no Smoker:  no Diet:  Drinks more water.  Cut back on fast food Exercise:  Not routine Depression/stress:  no Sleep hygiene:  good Family history of headache:  No She does have remote history of seizures as a child from age 89 to 2014.  They occurred 2 or 3 times a year.  Details are unclear, but reportedly she was never started on an antiepileptic medication because they were infrequent.  They resolved spontanously  PAST MEDICAL HISTORY: Seizures as child (ages 89 to [age])  PAST SURGICAL  HISTORY: History reviewed. No pertinent past surgical history.  MEDICATIONS: Current Outpatient Prescriptions on File Prior to Visit  Medication Sig Dispense Refill  . albuterol (PROVENTIL HFA;VENTOLIN HFA) 108 (90 BASE) MCG/ACT inhaler Inhale 2 puffs into the lungs 4 (four) times daily. (Patient taking differently: Inhale 2 puffs into the lungs every 6 (six) hours as needed for wheezing or shortness of breath. ) 1 Inhaler 0  . cetirizine (ZYRTEC ALLERGY) 10 MG tablet Take 1 tablet (10 mg total) by mouth daily. 30 tablet 1  . ibuprofen (ADVIL,MOTRIN) 800 MG tablet Take 1 tablet (800 mg total) by mouth every 8 (eight) hours as needed. 21 tablet 0  . ipratropium (ATROVENT) 0.06 % nasal spray Place 2 sprays into both nostrils 4 (four) times daily. 15 mL 1   No current facility-administered medications on file prior to visit.    ALLERGIES: No Known Allergies  FAMILY HISTORY: History reviewed. No pertinent family history.  SOCIAL HISTORY: Social History   Social History  . Marital Status: Single    Spouse Name: N/A  . Number of Children: N/A  . Years of Education: N/A   Occupational History  . Not on file.   Social History Main Topics  . Smoking status: Never Smoker   . Smokeless tobacco: Not on file  . Alcohol Use: No  . Drug Use: No  . Sexual Activity: No   Other Topics Concern  . Not on file   Social History Narrative    REVIEW OF SYSTEMS: Constitutional: No fevers, chills, or sweats, no generalized fatigue, change in appetite Eyes: No visual changes, double vision, eye pain Ear,  nose and throat: nasal congestion Cardiovascular: No chest pain, palpitations Respiratory:  No SOB GastrointestinaI: No nausea, vomiting, diarrhea, abdominal pain, fecal incontinence Genitourinary:  No dysuria, urinary retention or frequency Musculoskeletal:  No neck pain, back pain Integumentary: No rash, pruritus, skin lesions Neurological: as above Psychiatric: No depression,  insomnia, anxiety Endocrine: No palpitations, fatigue, diaphoresis, mood swings, change in appetite, change in weight, increased thirst Hematologic/Lymphatic:  No anemia, purpura, petechiae. Allergic/Immunologic: no itchy/runny eyes, nasal congestion, recent allergic reactions, rashes  PHYSICAL EXAM: Filed Vitals:   08/04/15 0754  BP: 118/74  Pulse: 91   General: No acute distress.  Patient appears well-groomed.  Head:  Normocephalic/atraumatic Eyes:  fundi unremarkable, without vessel changes, exudates, hemorrhages or papilledema. Neck: supple, no paraspinal tenderness, full range of motion Back: No paraspinal tenderness Heart: regular rate and rhythm Lungs: Clear to auscultation bilaterally. Vascular: No carotid bruits. Neurological Exam: Mental status: alert and oriented to person, place, and time, recent and remote memory intact, fund of knowledge intact, attention and concentration intact, speech fluent and not dysarthric, language intact. Cranial nerves: CN I: not tested CN II: pupils equal, round and reactive to light, visual fields intact, fundi unremarkable, without vessel changes, exudates, hemorrhages or papilledema. CN III, IV, VI:  full range of motion, no nystagmus, no ptosis CN V: She endorses vague decreased sensitivity on right side of face compared to left side with touch CN VII: upper and lower face symmetric CN VIII: hearing intact CN IX, X: gag intact, uvula midline CN XI: sternocleidomastoid and trapezius muscles intact CN XII: tongue midline Bulk & Tone: normal, no fasciculations. Motor:  5/5 throughout Sensation: temperature and vibration sensation intact. Deep Tendon Reflexes:  2+ throughout, toes downgoing.  Finger to nose testing:  Without dysmetria.  Heel to shin:  Without dysmetria.  Gait:  Normal station and stride.  Able to turn and tandem walk. Romberg negative.  IMPRESSION: Chronic migraine without aura, complicated by medication  overuse History of seizures of child.  She has been seizure free for 7 years  CT of head is unremarkable for intracranial abnormality.  Exam normal except for vague subjective decreased facial sensation.  I don't think it is a significant finding, as she does not endorse definite numbness nor exhibits any objective abnormality.  PLAN: 1.  Will start topiramate, titrating to 50mg  at bedtime.  Side effects discussed.  She was advised to take folic acid 1mg  daily as well. 2.  Advised to stop taking Excedrin 3.  For abortive therapy, will try sumatriptan, limited to no more than 2 days out of the week 4.  She is to keep headache diary and call in 4 weeks with update.  We can adjust dose of topiramate if needed. 5.  Follow up in 3 months.  Thank you for allowing me to take part in the care of this patient.  Shon Millet, DO

## 2015-08-04 NOTE — Patient Instructions (Signed)
Migraine Recommendations: 1.  Start topiramate 25mg  tablet.  Take 1 tablet at bedtime for 7 days, then increase to 2 tablets at bedtime.  Call in 4 weeks with update and we can adjust dose if needed.   TAKE FOLIC ACID 1MG  DAILY.  Possible side effects include: impaired thinking, sedation, paresthesias (numbness and tingling) and weight loss.  It may cause dehydration and there is a small risk for kidney stones, so make sure to stay hydrated with water during the day.  There is also a very small risk for glaucoma, so if you notice any change in your vision while taking this medication, see an ophthalmologist.    2.  Take sumatriptan 100mg  at earliest onset of headache.  May repeat dose once in 2 hours if needed.  Do not exceed two tablets in 24 hours. 3.  Limit use of pain relievers to no more than 2 days out of the week.  These medications include acetaminophen, ibuprofen, triptans and narcotics.  This will help reduce risk of rebound headaches.  I WOULD STOP TAKING EXCEDRIN ALL TOGETHER 4.  Be aware of common food triggers such as processed sweets, processed foods with nitrites (such as deli meat, hot dogs, sausages), foods with MSG, alcohol (such as wine), chocolate, certain cheeses, certain fruits (dried fruits, some citrus fruit), vinegar, diet soda. 4.  Avoid caffeine 5.  Routine exercise 6.  Proper sleep hygiene 7.  Stay adequately hydrated with water 8.  Keep a headache diary. 9.  Maintain proper stress management. 10.  Do not skip meals. 11.  Consider supplements:  Magnesium oxide 400mg  to 600mg  daily, riboflavin 400mg , Coenzyme Q 10 100mg  three times daily 12.  Follow up in 3 months but CALL IN 4 WEEKS WITH UPDATE

## 2015-08-06 ENCOUNTER — Encounter (HOSPITAL_COMMUNITY): Payer: Self-pay | Admitting: Emergency Medicine

## 2015-08-06 ENCOUNTER — Emergency Department (HOSPITAL_COMMUNITY)
Admission: EM | Admit: 2015-08-06 | Discharge: 2015-08-06 | Disposition: A | Discharge status: Home / Self Care | Payer: Medicaid Other | Attending: Emergency Medicine | Admitting: Emergency Medicine

## 2015-08-06 DIAGNOSIS — Z79899 Other long term (current) drug therapy: Secondary | ICD-10-CM | POA: Diagnosis not present

## 2015-08-06 DIAGNOSIS — G43709 Chronic migraine without aura, not intractable, without status migrainosus: Secondary | ICD-10-CM | POA: Diagnosis not present

## 2015-08-06 DIAGNOSIS — R51 Headache: Secondary | ICD-10-CM | POA: Diagnosis present

## 2015-08-06 HISTORY — DX: Migraine, unspecified, not intractable, without status migrainosus: G43.909

## 2015-08-06 MED ORDER — DIPHENHYDRAMINE HCL 50 MG/ML IJ SOLN
25.0000 mg | Freq: Once | INTRAMUSCULAR | Status: AC
  Administered 2015-08-06: 25 mg via INTRAVENOUS
  Filled 2015-08-06: qty 1

## 2015-08-06 MED ORDER — SODIUM CHLORIDE 0.9 % IV BOLUS (SEPSIS)
1000.0000 mL | Freq: Once | INTRAVENOUS | Status: AC
  Administered 2015-08-06: 1000 mL via INTRAVENOUS

## 2015-08-06 MED ORDER — METOCLOPRAMIDE HCL 5 MG/ML IJ SOLN
10.0000 mg | Freq: Once | INTRAMUSCULAR | Status: AC
  Administered 2015-08-06: 10 mg via INTRAVENOUS
  Filled 2015-08-06: qty 2

## 2015-08-06 MED ORDER — DEXAMETHASONE SODIUM PHOSPHATE 10 MG/ML IJ SOLN
10.0000 mg | Freq: Once | INTRAMUSCULAR | Status: AC
  Administered 2015-08-06: 10 mg via INTRAVENOUS
  Filled 2015-08-06: qty 1

## 2015-08-06 NOTE — ED Notes (Signed)
Patient here with complaint of headache. States headaches have been almost constant for a "a while now". Reports that Neurologist gave her 2 rx for headache with neither of them have helped.

## 2015-08-06 NOTE — ED Notes (Signed)
Pt states she had nausea and felt hot after taking medication for h/a. State meds made h/a worse.

## 2015-08-06 NOTE — ED Provider Notes (Signed)
CSN: [id number]     Arrival date & time 08/06/15  0612 History   First MD Initiated Contact with Patient 08/06/15 [phone]     Chief Complaint  Patient presents with  . Headache   HPI  Natasha Martinez is a 20 year old female with a past medical history of migraines presenting with headache. She states that she has had a chronic headache for "a long time now". Chart review shows she has been seen since October 2015 with complaints of daily headache. As this is typical of her headache. The pain is frontal and described as throbbing and "beeping". She reports occasional associated blurred vision but is not currently having vision problems. She is endorsing nausea and photophobia. He was seen recently by neurology who prescribed her Topamax and Imitrex. She states that she has taken these medications but it made the headache worse. She states that she "got hot all over" after taking the medications so she decided to come to the emergency department for further treatment of her headaches. She states "nothing has ever worked for me". Denies fevers, chills, visual disturbances, cold symptoms, neck pain, neck stiffness, shortness of breath, cough, abdominal pain or weakness in the extremities, numbness in the extremities, dizziness or syncope.  Past Medical History  Diagnosis Date  . Migraine    History reviewed. No pertinent past surgical history. History reviewed. No pertinent family history. Social History  Substance Use Topics  . Smoking status: Never Smoker   . Smokeless tobacco: None  . Alcohol Use: No   OB History    No data available     Review of Systems  Eyes: Positive for photophobia.  Gastrointestinal: Positive for nausea.  Neurological: Positive for headaches.  All other systems reviewed and are negative.     Allergies  Review of patient's allergies indicates no known allergies.  Home Medications   Prior to Admission medications   Medication Sig Start Date End Date Taking?  Authorizing Provider  albuterol (PROVENTIL HFA;VENTOLIN HFA) 108 (90 BASE) MCG/ACT inhaler Inhale 2 puffs into the lungs 4 (four) times daily. Patient taking differently: Inhale 2 puffs into the lungs every 6 (six) hours as needed for wheezing or shortness of breath.  02/22/13  Yes Reuben Likesavid C Keller, MD  ibuprofen (ADVIL,MOTRIN) 800 MG tablet Take 1 tablet (800 mg total) by mouth every 8 (eight) hours as needed. 04/09/15  Yes Christopher Lawyer, PA-C  ipratropium (ATROVENT) 0.06 % nasal spray Place 2 sprays into both nostrils 4 (four) times daily. 08/26/13  Yes Linna HoffJames D Kindl, MD  SUMAtriptan (IMITREX) 100 MG tablet Take 1 tab at earliest onset of headache.  May repeat once in 2 hours if headache persists or recurs.  Do not exceed 2 tablets in 24 hours 08/04/15  Yes Adam Mliss Fritz Jaffe, DO  topiramate (TOPAMAX) 25 MG tablet Take 1tablet at bedtime for 7 days, then 2 tablets at bedtime. 08/04/15  Yes Adam R Jaffe, DO   BP 104/74 mmHg  Pulse 69  Temp(Src) 99.1 F (37.3 C) (Oral)  Resp 16  Ht 5\' 3"  (1.6 m)  Wt 61.236 kg  BMI 23.92 kg/m2  SpO2 98% Physical Exam  Constitutional: She is oriented to person, place, and time. She appears well-developed and well-nourished. No distress.  HENT:  Head: Normocephalic and atraumatic.  Mouth/Throat: Oropharynx is clear and moist. No oropharyngeal exudate.  Eyes: Conjunctivae and EOM are normal. Pupils are equal, round, and reactive to light. Right eye exhibits no discharge. Left eye exhibits no discharge.  No scleral icterus.  Neck: Normal range of motion. Neck supple.  Cardiovascular: Normal rate, regular rhythm and normal heart sounds.   Pulmonary/Chest: Effort normal and breath sounds normal. No respiratory distress. She has no wheezes. She has no rales.  Abdominal: Soft. There is no tenderness.  Musculoskeletal: Normal range of motion.  Pt moves all extremities spontaneously and walks with a steady gait  Neurological: She is alert and oriented to person, place, and  time. No cranial nerve deficit. Coordination normal.  Cranial nerves 3-12 intact. Major muscle groups with 5/5 motor strength. Sensation to light touch intact. Coordinated finger to nose. Walks with a steady gait.   Skin: Skin is warm and dry.  Psychiatric: She has a normal mood and affect. Her behavior is normal.  Nursing note and vitals reviewed.   ED Course  Procedures (including critical care time) Labs Review Labs Reviewed - No data to display  Imaging Review No results found. I have personally reviewed and evaluated these images and lab results as part of my medical decision-making.   EKG Interpretation None      9:45 - Re-assessment of pt. Reports improvement in HA after cocktail. No neuro changes. Will finish out IVF and anticipate discharge.   MDM   Final diagnoses:  Chronic migraine without aura without status migrainosus, not intractable   20 year old female presenting with migraine headache. Patient reports daily headache for the past year. She is followed by neurology and reports that her new medications make her headache worse. He has had relief with headache cocktail in the past. Vital signs stable. Nonfocal neuro exam. No meningeal signs. Given IV fluids, Decadron, Reglan and Benadryl which she reports improved her symptoms. Encouraged her to follow up with her neurologist for her chronic migraines and for possible change in medications. Return precautions given in discharge paperwork and discussed with pt at bedside. Pt stable for discharge     Alveta Heimlich, PA-C 08/06/15 1035  Raeford Razor, MD 08/17/15 1319

## 2015-08-06 NOTE — Discharge Instructions (Signed)
Schedule a follow up appointment with your neurologist.    Migraine Headache A migraine headache is an intense, throbbing pain on one or both sides of your head. A migraine can last for 30 minutes to several hours. CAUSES  The exact cause of a migraine headache is not always known. However, a migraine may be caused when nerves in the brain become irritated and release chemicals that cause inflammation. This causes pain. Certain things may also trigger migraines, such as:  Alcohol.  Smoking.  Stress.  Menstruation.  Aged cheeses.  Foods or drinks that contain nitrates, glutamate, aspartame, or tyramine.  Lack of sleep.  Chocolate.  Caffeine.  Hunger.  Physical exertion.  Fatigue.  Medicines used to treat chest pain (nitroglycerine), birth control pills, estrogen, and some blood pressure medicines. SIGNS AND SYMPTOMS  Pain on one or both sides of your head.  Pulsating or throbbing pain.  Severe pain that prevents daily activities.  Pain that is aggravated by any physical activity.  Nausea, vomiting, or both.  Dizziness.  Pain with exposure to bright lights, loud noises, or activity.  General sensitivity to bright lights, loud noises, or smells. Before you get a migraine, you may get warning signs that a migraine is coming (aura). An aura may include:  Seeing flashing lights.  Seeing bright spots, halos, or zigzag lines.  Having tunnel vision or blurred vision.  Having feelings of numbness or tingling.  Having trouble talking.  Having muscle weakness. DIAGNOSIS  A migraine headache is often diagnosed based on:  Symptoms.  Physical exam.  A CT scan or MRI of your head. These imaging tests cannot diagnose migraines, but they can help rule out other causes of headaches. TREATMENT Medicines may be given for pain and nausea. Medicines can also be given to help prevent recurrent migraines.  HOME CARE INSTRUCTIONS  Only take over-the-counter or  prescription medicines for pain or discomfort as directed by your health care provider. The use of long-term narcotics is not recommended.  Lie down in a dark, quiet room when you have a migraine.  Keep a journal to find out what may trigger your migraine headaches. For example, write down:  What you eat and drink.  How much sleep you get.  Any change to your diet or medicines.  Limit alcohol consumption.  Quit smoking if you smoke.  Get 7-9 hours of sleep, or as recommended by your health care provider.  Limit stress.  Keep lights dim if bright lights bother you and make your migraines worse. SEEK IMMEDIATE MEDICAL CARE IF:   Your migraine becomes severe.  You have a fever.  You have a stiff neck.  You have vision loss.  You have muscular weakness or loss of muscle control.  You start losing your balance or have trouble walking.  You feel faint or pass out.  You have severe symptoms that are different from your first symptoms. MAKE SURE YOU:   Understand these instructions.  Will watch your condition.  Will get help right away if you are not doing well or get worse.   This information is not intended to replace advice given to you by your health care provider. Make sure you discuss any questions you have with your health care provider.   Document Released: 07/09/2005 Document Revised: 07/30/2014 Document Reviewed: 03/16/2013 Elsevier Interactive Patient Education [employer]2016 [employer]

## 2015-08-08 ENCOUNTER — Telehealth: Payer: Self-pay

## 2015-08-08 MED ORDER — RIZATRIPTAN BENZOATE 10 MG PO TBDP: 10.0000 mg | ORAL_TABLET | ORAL | Status: DC | PRN

## 2015-08-08 NOTE — Telephone Encounter (Signed)
Received Message from Dr Everlena CooperJaffe stating, "As per ER note, patient had a reaction to the Imitrex. Please contact her and instead of sumatriptan, we can try Maxalt 10mg . Take 1 tablet at earliest onset of headache and may repeat dose once in 2 hours if needed. She is not to take more than 2 doses in 24 hours."

## 2015-08-12 ENCOUNTER — Encounter (HOSPITAL_COMMUNITY): Payer: Self-pay | Admitting: *Deleted

## 2015-08-12 ENCOUNTER — Emergency Department (HOSPITAL_COMMUNITY)
Admission: EM | Admit: 2015-08-12 | Discharge: 2015-08-12 | Disposition: A | Discharge status: Home / Self Care | Payer: Medicaid Other | Attending: Emergency Medicine | Admitting: Emergency Medicine

## 2015-08-12 DIAGNOSIS — R000 Tachycardia, unspecified: Secondary | ICD-10-CM | POA: Diagnosis not present

## 2015-08-12 DIAGNOSIS — G43909 Migraine, unspecified, not intractable, without status migrainosus: Secondary | ICD-10-CM | POA: Insufficient documentation

## 2015-08-12 DIAGNOSIS — R51 Headache: Secondary | ICD-10-CM | POA: Diagnosis present

## 2015-08-12 DIAGNOSIS — R519 Headache, unspecified: Secondary | ICD-10-CM

## 2015-08-12 DIAGNOSIS — Z79899 Other long term (current) drug therapy: Secondary | ICD-10-CM | POA: Diagnosis not present

## 2015-08-12 MED ORDER — METOCLOPRAMIDE HCL 5 MG/ML IJ SOLN
5.0000 mg | Freq: Once | INTRAMUSCULAR | Status: AC
  Administered 2015-08-12: 5 mg via INTRAVENOUS
  Filled 2015-08-12: qty 2

## 2015-08-12 MED ORDER — DEXAMETHASONE SODIUM PHOSPHATE 10 MG/ML IJ SOLN
10.0000 mg | Freq: Once | INTRAMUSCULAR | Status: AC
  Administered 2015-08-12: 10 mg via INTRAVENOUS
  Filled 2015-08-12: qty 1

## 2015-08-12 MED ORDER — KETOROLAC TROMETHAMINE 30 MG/ML IJ SOLN
30.0000 mg | Freq: Once | INTRAMUSCULAR | Status: AC
  Administered 2015-08-12: 30 mg via INTRAVENOUS
  Filled 2015-08-12: qty 1

## 2015-08-12 MED ORDER — DIPHENHYDRAMINE HCL 50 MG/ML IJ SOLN
25.0000 mg | Freq: Once | INTRAMUSCULAR | Status: AC
  Administered 2015-08-12: 25 mg via INTRAVENOUS
  Filled 2015-08-12: qty 1

## 2015-08-12 MED ORDER — ONDANSETRON HCL 4 MG/2ML IJ SOLN
4.0000 mg | Freq: Once | INTRAMUSCULAR | Status: AC
  Administered 2015-08-12: 4 mg via INTRAVENOUS
  Filled 2015-08-12: qty 2

## 2015-08-12 MED ORDER — PROCHLORPERAZINE EDISYLATE 5 MG/ML IJ SOLN
10.0000 mg | Freq: Four times a day (QID) | INTRAMUSCULAR | Status: DC | PRN
  Administered 2015-08-12: 10 mg via INTRAVENOUS
  Filled 2015-08-12: qty 2

## 2015-08-12 MED ORDER — SODIUM CHLORIDE 0.9 % IV BOLUS (SEPSIS)
1000.0000 mL | Freq: Once | INTRAVENOUS | Status: AC
  Administered 2015-08-12: 1000 mL via INTRAVENOUS

## 2015-08-12 NOTE — ED Notes (Signed)
Pt reports hx of migraines, went to neuro and given medicine with no relief. Came back here on 1/14 due to return of headache and was told to come back here if it became worse. Having nausea, no vomiting.

## 2015-08-12 NOTE — Discharge Instructions (Signed)

## 2015-08-12 NOTE — ED Provider Notes (Signed)
CSN: [id number]     Arrival date & time 08/12/15  1418 History  By signing my name below, I, Essence Howell, attest that this documentation has been prepared under the direction and in the presence of Cheri Fowler, PA-C Electronically Signed: Charline Bills, ED Scribe 08/12/2015 at 2:48 PM.   Chief Complaint  Patient presents with  . Headache   The history is provided by the patient. No language interpreter was used.   HPI Comments: Natasha Martinez is a 20 y.o. female, with a h/o migraines, who presents to the Emergency Department complaining of intermittent throbbing, "beeping" HAs for the past month. Pt states that HA feels similar to her usual migraines. She was recently seen (08/04/15) by her neurologist at [facility] and prescribed Topamax and Imitrex.  Negative head CT in December.  She states that the medication actually made her HA worse. She was also seen in the ED 6 days ago for similar headache which resolved after IVF and migraine cocktail. She reports associated photophobia and nausea. Pt denies head trauma or injury. She also denies fever, vomiting, neck stiffness, facial droop, slurred speech, or any other symptoms.  Past Medical History  Diagnosis Date  . Migraine    History reviewed. No pertinent past surgical history. History reviewed. No pertinent family history. Social History  Substance Use Topics  . Smoking status: Never Smoker   . Smokeless tobacco: None  . Alcohol Use: No   OB History    No data available     Review of Systems  Constitutional: Negative for fever.  Eyes: Positive for photophobia.  Gastrointestinal: Positive for nausea. Negative for vomiting.  Neurological: Positive for headaches. Negative for speech difficulty.  All other systems reviewed and are negative.  Allergies  Review of patient's allergies indicates no known allergies.  Home Medications   Prior to Admission medications   Medication Sig Start Date End Date Taking? Authorizing  Provider  albuterol (PROVENTIL HFA;VENTOLIN HFA) 108 (90 BASE) MCG/ACT inhaler Inhale 2 puffs into the lungs 4 (four) times daily. Patient taking differently: Inhale 2 puffs into the lungs every 6 (six) hours as needed for wheezing or shortness of breath.  02/22/13   Reuben Likes, MD  ibuprofen (ADVIL,MOTRIN) 800 MG tablet Take 1 tablet (800 mg total) by mouth every 8 (eight) hours as needed. 04/09/15   Christopher Lawyer, PA-C  ipratropium (ATROVENT) 0.06 % nasal spray Place 2 sprays into both nostrils 4 (four) times daily. 08/26/13   Linna Hoff, MD  rizatriptan (MAXALT-MLT) 10 MG disintegrating tablet Take 1 tablet (10 mg total) by mouth as needed for migraine. May repeat in 2 hours if needed 08/08/15   Drema Dallas, DO  SUMAtriptan (IMITREX) 100 MG tablet Take 1 tab at earliest onset of headache.  May repeat once in 2 hours if headache persists or recurs.  Do not exceed 2 tablets in 24 hours 08/04/15   Drema Dallas, DO  topiramate (TOPAMAX) 25 MG tablet Take 1tablet at bedtime for 7 days, then 2 tablets at bedtime. 08/04/15   Drema Dallas, DO   BP 115/76 mmHg  Pulse 110  Temp(Src) 98.9 F (37.2 C) (Oral)  Resp 20  Ht  (1.6 m)  Wt 137 lb (62.143 kg)  BMI 24.27 kg/m2  SpO2 99% Physical Exam  Constitutional: She is oriented to person, place, and time. She appears well-developed and well-nourished.  Non-toxic appearance. She does not have a sickly appearance. She does not appear ill.  HENT:  Head: Normocephalic and atraumatic.  Mouth/Throat: Oropharynx is clear and moist.  Eyes: Conjunctivae are normal. Pupils are equal, round, and reactive to light.  Neck: Normal range of motion. Neck supple.  No nuchal rigidity.  Cardiovascular: Regular rhythm and normal heart sounds.  Tachycardia present.   No murmur heard. HR 110  Pulmonary/Chest: Effort normal and breath sounds normal. No accessory muscle usage or stridor. No respiratory distress. She has no wheezes. She has no rhonchi. She has no  rales.  Abdominal: Soft. Bowel sounds are normal. She exhibits no distension. There is no tenderness.  Musculoskeletal: Normal range of motion.  Lymphadenopathy:    She has no cervical adenopathy.  Neurological: She is alert and oriented to person, place, and time.  Mental Status:   AOx3.  Speech clear without dysarthria. Cranial Nerves:  I-not tested  II-PERRLA  III, IV, VI-EOMs intact  V-temporal and masseter strength intact  VII-symmetrical facial movements intact, no facial droop  VIII-hearing grossly intact bilaterally  IX, X-gag intact  XI-strength of sternomastoid and trapezius muscles 5/5  XII-tongue midline Motor:   Good muscle bulk and tone  Strength 5/5 bilaterally in upper and lower extremities   Cerebellar--RAMs, finger to nose intact  Romberg--maintains balance with eyes closed  Casual and tandem gait normal without ataxia  No pronator drift Sensory:  Intact in upper and lower extremities   Skin: Skin is warm and dry.  Psychiatric: She has a normal mood and affect. Her behavior is normal.   ED Course  Procedures (including critical care time) DIAGNOSTIC STUDIES: Oxygen Saturation is 99% on RA, normal by my interpretation.    COORDINATION OF CARE: 2:43 PM-Discussed treatment plan which includes IV fluids, Reglan, Benadryl, Decadron, Toradol injection with pt at bedside and pt agreed to plan.   Labs Review Labs Reviewed - No data to display  Imaging Review No results found.   EKG Interpretation None      MDM   Final diagnoses:  Nonintractable headache, unspecified chronicity pattern, unspecified headache type    Pt HA treated and improved while in ED.  Presentation is like pts typical HA and non concerning for [facility], ICH, Meningitis, or temporal arteritis. Pt is afebrile with no focal neuro deficits, nuchal rigidity, or change in vision. No indication for imaging at this time given recent negative head CT and no new/recent injury/trauma.  Discussed  return precautions.  Pt is to follow up with neurologist to discuss alternative prophylactic medication. Anticipate discharge pending symptom improvement after migraine cocktail and IVF.  I personally performed the services described in this documentation, which was scribed in my presence. The recorded information has been reviewed and is accurate.     Cheri Fowler, PA-C 08/12/15 1613  Laurence Spates, MD 08/13/15 [phone]

## 2015-08-12 NOTE — ED Notes (Signed)
Pt states this is her "usual" headache. Pain is 10/10. Pt is tearful. A/A/O.

## 2015-09-13 ENCOUNTER — Encounter (HOSPITAL_COMMUNITY): Payer: Self-pay | Admitting: Emergency Medicine

## 2015-09-13 DIAGNOSIS — Z79899 Other long term (current) drug therapy: Secondary | ICD-10-CM | POA: Insufficient documentation

## 2015-09-13 DIAGNOSIS — G43909 Migraine, unspecified, not intractable, without status migrainosus: Secondary | ICD-10-CM | POA: Diagnosis not present

## 2015-09-13 DIAGNOSIS — R112 Nausea with vomiting, unspecified: Secondary | ICD-10-CM | POA: Diagnosis not present

## 2015-09-13 DIAGNOSIS — R630 Anorexia: Secondary | ICD-10-CM | POA: Insufficient documentation

## 2015-09-13 DIAGNOSIS — R197 Diarrhea, unspecified: Secondary | ICD-10-CM | POA: Diagnosis not present

## 2015-09-13 DIAGNOSIS — Z793 Long term (current) use of hormonal contraceptives: Secondary | ICD-10-CM | POA: Insufficient documentation

## 2015-09-13 DIAGNOSIS — Z3202 Encounter for pregnancy test, result negative: Secondary | ICD-10-CM | POA: Insufficient documentation

## 2015-09-13 LAB — COMPREHENSIVE METABOLIC PANEL
ALBUMIN: 4.3 g/dL (ref 3.5–5.0)
ALK PHOS: 44 U/L (ref 38–126)
ALT: 13 U/L — ABNORMAL LOW (ref 14–54)
AST: 20 U/L (ref 15–41)
Anion gap: 9 (ref 5–15)
BILIRUBIN TOTAL: 0.3 mg/dL (ref 0.3–1.2)
BUN: 16 mg/dL (ref 6–20)
CALCIUM: 9.8 mg/dL (ref 8.9–10.3)
CO2: 24 mmol/L (ref 22–32)
Chloride: 109 mmol/L (ref 101–111)
Creatinine, Ser: 0.93 mg/dL (ref 0.44–1.00)
GFR calc Af Amer: >60 mL/min (ref >60)
GLUCOSE: 89 mg/dL (ref 65–99)
POTASSIUM: 4 mmol/L (ref 3.5–5.1)
Sodium: 142 mmol/L (ref 135–145)
TOTAL PROTEIN: 7.4 g/dL (ref 6.5–8.1)

## 2015-09-13 LAB — URINALYSIS, ROUTINE W REFLEX MICROSCOPIC
BILIRUBIN URINE: NEGATIVE
Glucose, UA: NEGATIVE mg/dL
Hgb urine dipstick: NEGATIVE
KETONES UR: NEGATIVE mg/dL
LEUKOCYTES UA: NEGATIVE
NITRITE: NEGATIVE
PROTEIN: NEGATIVE mg/dL
Specific Gravity, Urine: 1.031 — ABNORMAL HIGH (ref 1.005–1.030)
pH: 6 (ref 5.0–8.0)

## 2015-09-13 LAB — LIPASE, BLOOD: Lipase: 35 U/L (ref 11–51)

## 2015-09-13 LAB — CBC
HEMATOCRIT: 40.6 % (ref 36.0–46.0)
Hemoglobin: 13.2 g/dL (ref 12.0–15.0)
MCH: 28.2 pg (ref 26.0–34.0)
MCHC: 32.5 g/dL (ref 30.0–36.0)
MCV: 86.8 fL (ref 78.0–100.0)
PLATELETS: 278 10*3/uL (ref 150–400)
RBC: 4.68 MIL/uL (ref 3.87–5.11)
RDW: 13 % (ref 11.5–15.5)
WBC: 6.9 10*3/uL (ref 4.0–10.5)

## 2015-09-13 NOTE — ED Notes (Signed)
Patient here with 3 days of diarrhea and nausea. States that the smell of food induces nausea, but she hasn't been vomiting. States that she has no appetite, but has been drinking ginger ale to stay hydrated. Endorses sick contacts stating that multiple members of her family have had similar illness.

## 2015-09-14 ENCOUNTER — Emergency Department (HOSPITAL_COMMUNITY)
Admission: EM | Admit: 2015-09-14 | Discharge: 2015-09-14 | Disposition: A | Discharge status: Home / Self Care | Payer: Medicaid Other | Attending: Emergency Medicine | Admitting: Emergency Medicine

## 2015-09-14 DIAGNOSIS — R197 Diarrhea, unspecified: Secondary | ICD-10-CM

## 2015-09-14 DIAGNOSIS — R110 Nausea: Secondary | ICD-10-CM

## 2015-09-14 LAB — PREGNANCY, URINE: PREG TEST UR: NEGATIVE

## 2015-09-14 MED ORDER — ONDANSETRON 4 MG PO TBDP
8.0000 mg | ORAL_TABLET | Freq: Once | ORAL | Status: AC
  Administered 2015-09-14: 8 mg via ORAL
  Filled 2015-09-14: qty 2

## 2015-09-14 MED ORDER — ONDANSETRON 8 MG PO TBDP: 8.0000 mg | ORAL_TABLET | Freq: Three times a day (TID) | ORAL | Status: DC | PRN

## 2015-09-14 NOTE — Discharge Instructions (Signed)
Nausea, Adult Nausea is the feeling that you have an upset stomach or have to vomit. Nausea by itself is not likely a serious concern, but it may be an early sign of more serious medical problems. As nausea gets worse, it can lead to vomiting. If vomiting develops, there is the risk of dehydration.  CAUSES   Viral infections.  Food poisoning.  Medicines.  Pregnancy.  Motion sickness.  Migraine headaches.  Emotional distress.  Severe pain from any source.  Alcohol intoxication. HOME CARE INSTRUCTIONS  Get plenty of rest.  Ask your caregiver about specific rehydration instructions.  Eat small amounts of food and sip liquids more often.  Take all medicines as told by your caregiver. SEEK MEDICAL CARE IF:  You have not improved after 2 days, or you get worse.  You have a headache. SEEK IMMEDIATE MEDICAL CARE IF:   You have a fever.  You faint.  You keep vomiting or have blood in your vomit.  You are extremely weak or dehydrated.  You have dark or bloody stools.  You have severe chest or abdominal pain. MAKE SURE YOU:  Understand these instructions.  Will watch your condition.  Will get help right away if you are not doing well or get worse.   This information is not intended to replace advice given to you by your health care provider. Make sure you discuss any questions you have with your health care provider.   Document Released: 08/16/2004 Document Revised: 07/30/2014 Document Reviewed: 03/21/2011 Elsevier Interactive Patient Education 2016 Else[employer] Inc[employer] Diarrhea Diarrhea is frequent loose and watery bowel movements. It can cause you to feel weak and dehydrated. Dehydration can cause you to become tired and thirsty, have a dry mouth, and have decreased urination that often is dark yellow. Diarrhea is a sign of another problem, most often an infection that will not last long. In most cases, diarrhea typically lasts 2-3 days. However, it can last longer if it  is a sign of something more serious. It is important to treat your diarrhea as directed by your caregiver to lessen or prevent future episodes of diarrhea. CAUSES  Some common causes include:  Gastrointestinal infections caused by viruses, bacteria, or parasites.  Food poisoning or food allergies.  Certain medicines, such as antibiotics, chemotherapy, and laxatives.  Artificial sweeteners and fructose.  Digestive disorders. HOME CARE INSTRUCTIONS  Ensure adequate fluid intake (hydration): Have 1 cup (8 oz) of fluid for each diarrhea episode. Avoid fluids that contain simple sugars or sports drinks, fruit juices, whole milk products, and sodas. Your urine should be clear or pale yellow if you are drinking enough fluids. Hydrate with an oral rehydration solution that you can purchase at pharmacies, retail stores, and online. You can prepare an oral rehydration solution at home by mixing the following ingredients together:   - tsp table salt.   tsp baking soda.   tsp salt substitute containing potassium chloride.  1  tablespoons sugar.  1 L (34 oz) of water.  Certain foods and beverages may increase the speed at which food moves through the gastrointestinal (GI) tract. These foods and beverages should be avoided and include:  Caffeinated and alcoholic beverages.  High-fiber foods, such as raw fruits and vegetables, nuts, seeds, and whole grain breads and cereals.  Foods and beverages sweetened with sugar alcohols, such as xylitol, sorbitol, and mannitol.  Some foods may be well tolerated and may help thicken stool including:  Starchy foods, such as rice, toast, pasta, low-sugar cereal,  oatmeal, grits, baked potatoes, crackers, and bagels.  Bananas.  Applesauce.  Add probiotic-rich foods to help increase healthy bacteria in the GI tract, such as yogurt and fermented milk products.  Wash your hands well after each diarrhea episode.  Only take over-the-counter or  prescription medicines as directed by your caregiver.  Take a warm bath to relieve any burning or pain from frequent diarrhea episodes. SEEK IMMEDIATE MEDICAL CARE IF:   You are unable to keep fluids down.  You have persistent vomiting.  You have blood in your stool, or your stools are black and tarry.  You do not urinate in 6-8 hours, or there is only a small amount of very dark urine.  You have abdominal pain that increases or localizes.  You have weakness, dizziness, confusion, or light-headedness.  You have a severe headache.  Your diarrhea gets worse or does not get better.  You have a fever or persistent symptoms for more than 2-3 days.  You have a fever and your symptoms suddenly get worse. MAKE SURE YOU:   Understand these instructions.  Will watch your condition.  Will get help right away if you are not doing well or get worse.   This information is not intended to replace advice given to you by your health care provider. Make sure you discuss any questions you have with your health care provider.   Document Released: 06/29/2002 Document Revised: 07/30/2014 Document Reviewed: 03/16/2012 Elsevier Interactive Patient Education [employer]

## 2015-09-14 NOTE — ED Provider Notes (Signed)
CSN: [id number]     Arrival date & time 09/13/15  1939 History  By signing my name below, I, Iona Beard, attest that this documentation has been prepared under the direction and in the presence of Azalia Bilis, MD.   Electronically Signed: Iona Beard, ED Scribe. 09/14/2015. 3:05 AM     Chief Complaint  Patient presents with  . Nausea  . Diarrhea    The history is provided by the patient. No language interpreter was used.   HPI Comments: Natasha Martinez is a 20 y.o. female who presents to the Emergency Department complaining of gradual onset nausea, onset about two days ago. Pt reports associated loss of appetite, diarrhea, and vomiting. She also states positive sick contact with relatives. No worsening or alleviating factors noted. Pt denies difficulty urinating, or any other pertinent symptoms. . No dysuria.   Past Medical History  Diagnosis Date  . Migraine    History reviewed. No pertinent past surgical history. History reviewed. No pertinent family history. Social History  Substance Use Topics  . Smoking status: Never Smoker   . Smokeless tobacco: None  . Alcohol Use: No   OB History    No data available     Review of Systems A complete 10 system review of systems was obtained and all systems are negative except as noted in the HPI and PMH.     Allergies  Review of patient's allergies indicates no known allergies.  Home Medications   Prior to Admission medications   Medication Sig Start Date End Date Taking? Authorizing Provider  albuterol (PROVENTIL HFA;VENTOLIN HFA) 108 (90 BASE) MCG/ACT inhaler Inhale 2 puffs into the lungs 4 (four) times daily. Patient taking differently: Inhale 2 puffs into the lungs every 6 (six) hours as needed for wheezing or shortness of breath.  02/22/13  Yes Reuben Likes, MD  ibuprofen (ADVIL,MOTRIN) 800 MG tablet Take 1 tablet (800 mg total) by mouth every 8 (eight) hours as needed. 04/09/15  Yes Christopher Lawyer, PA-C   medroxyPROGESTERone (DEPO-PROVERA) 150 MG/ML injection Inject 150 mg into the muscle every 3 (three) months. Dec 2016   Yes Historical Provider, MD  rizatriptan (MAXALT-MLT) 10 MG disintegrating tablet Take 1 tablet (10 mg total) by mouth as needed for migraine. May repeat in 2 hours if needed 08/08/15  Yes Adam Mliss Fritz, DO  SUMAtriptan (IMITREX) 100 MG tablet Take 1 tab at earliest onset of headache.  May repeat once in 2 hours if headache persists or recurs.  Do not exceed 2 tablets in 24 hours 08/04/15  Yes Adam R Jaffe, DO   BP 115/80 mmHg  Pulse 84  Temp(Src) 98.6 F (37 C) (Oral)  Resp 14  Ht  (1.6 m)  Wt 140 lb (63.504 kg)  BMI 24.81 kg/m2  SpO2 99% Physical Exam  Constitutional: She is oriented to person, place, and time. She appears well-developed and well-nourished. No distress.  HENT:  Head: Normocephalic and atraumatic.  Eyes: EOM are normal.  Neck: Normal range of motion.  Cardiovascular: Normal rate, regular rhythm and normal heart sounds.   Pulmonary/Chest: Effort normal and breath sounds normal.  Abdominal: Soft. She exhibits no distension. There is no tenderness.  Musculoskeletal: Normal range of motion.  Neurological: She is alert and oriented to person, place, and time.  Skin: Skin is warm and dry.  Psychiatric: She has a normal mood and affect. Judgment normal.  Nursing note and vitals reviewed.   ED Course  Procedures (including critical care time) DIAGNOSTIC  STUDIES: Oxygen Saturation is 99% on RA, normal by my interpretation.    COORDINATION OF CARE: 2:38 AM-Discussed treatment plan which includes CBC, urinalysis, and CMP with pt at bedside and pt agreed to plan.     Labs Review Labs Reviewed  COMPREHENSIVE METABOLIC PANEL - Abnormal; Notable for the following:    ALT 13 (*)    All other components within normal limits  URINALYSIS, ROUTINE W REFLEX MICROSCOPIC (NOT AT [facility]) - Abnormal; Notable for the following:    APPearance CLOUDY (*)     Specific Gravity, Urine 1.031 (*)    All other components within normal limits  LIPASE, BLOOD  CBC  PREGNANCY, URINE    Imaging Review No results found. I have personally reviewed and evaluated these lab results as part of my medical decision-making.   EKG Interpretation None      MDM   Final diagnoses:  Nausea  Diarrhea, unspecified type    Patient is overall well-appearing.  Discharge home in good condition.  Abdominal exam is benign.  Likely viral process.  Home with Zofran.  She understands to return the ER for new or worsening symptoms   I personally performed the services described in this documentation, which was scribed in my presence. The recorded information has been reviewed and is accurate.        Azalia Bilis, MD 09/14/15 [phone]

## 2015-11-22 ENCOUNTER — Encounter: Payer: Self-pay | Admitting: Neurology

## 2015-11-22 ENCOUNTER — Ambulatory Visit (INDEPENDENT_AMBULATORY_CARE_PROVIDER_SITE_OTHER): Payer: Medicaid Other | Admitting: Neurology

## 2015-11-22 VITALS — HR 78 | Ht 63.0 in | Wt 143.0 lb

## 2015-11-22 DIAGNOSIS — G43709 Chronic migraine without aura, not intractable, without status migrainosus: Secondary | ICD-10-CM | POA: Diagnosis not present

## 2015-11-22 MED ORDER — ELETRIPTAN HYDROBROMIDE 40 MG PO TABS: ORAL_TABLET | ORAL | Status: DC

## 2015-11-22 MED ORDER — TOPIRAMATE 50 MG PO TABS: 50.0000 mg | ORAL_TABLET | Freq: Every day | ORAL | Status: DC

## 2015-11-22 NOTE — Progress Notes (Signed)
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  History obtained by patient, her mother and several ED visit notes.    UPDATE: She stopped topamax because she wanted to see if the headaches would resolve by themselves.  She had a reaction to sumatriptan.  When she took it, she felt warm and headache got worse. Intensity:  9/10 Duration:  All day Frequency:  daily Current NSAIDS:  Aleve daily Current analgesics:  no Current triptans:  Maxalt  (ineffective) Current anti-emetic:  no Current muscle relaxants:  no Current anti-anxiolytic:  no Current sleep aide:  no Antihypertensive medications:  none Antidepressant medications:  none Anticonvulsant medications:  none Vitamins/Herbal/Supplements:  none. Other therapy:  none Current Antihistamines/Decongestants:  none Other therapy:  no    Caffeine:  Coffee or soda occasionally Alcohol:  no Smoker:  no Diet:  Drinks more water.  Cut back on fast food Exercise:  Not routine Depression/stress:  no Sleep hygiene:  good  Labs from February include normal CMP and CBC, as well as negative pregnancy test.  HISTORY: Onset:  Since childhood, but has been daily for about one year Location:  Bi-frontal Quality:  Throbbing, pressure Initial Intensity:  9/10 (sometimes 5/10) Aura:  no Prodrome:  no Associated symptoms:  Nausea, photophobia, phonophobia.  No vomiting or visual disturbance Initial Duration:  Half a day.  Often wakes up with it. Initial Frequency:  daily Triggers/exacerbating factors:  Bright lights Relieving factors:  Low-lights Activity:  Able to force self to function  Past NSAIDS:  ibuprofen  Past analgesics:  Excedrin Migraine. Past abortive triptans:  sumatriptan  (side effect) Past anti-emetic:  none Past antihypertensive  medications:  none Past antidepressant medications:  none Past anticonvulsant medications:  none Past vitamins/Herbal/Supplements:  None  She had a CT of the head performed on 07/03/15, which was normal.  Family history of headache:  No She does have remote history of seizures as a child from age 50 to 89.  They occurred 2 or 3 times a year.  Details are unclear, but reportedly she was never started on an antiepileptic medication because they were infrequent.  They resolved spontanously  PAST MEDICAL HISTORY: Past Medical History  Diagnosis Date  . Migraine     MEDICATIONS: Current Outpatient Prescriptions on File Prior to Visit  Medication Sig Dispense Refill  . albuterol (PROVENTIL HFA;VENTOLIN HFA) 108 (90 BASE) MCG/ACT inhaler Inhale 2 puffs into the lungs 4 (four) times daily. (Patient taking differently: Inhale 2 puffs into the lungs every 6 (six) hours as needed for wheezing or shortness of breath. ) 1 Inhaler 0  . ibuprofen (ADVIL,MOTRIN) 800 MG tablet Take 1 tablet (800 mg total) by mouth every 8 (eight) hours as needed. 21 tablet 0  . medroxyPROGESTERone (DEPO-PROVERA) 150 MG/ML injection Inject 150 mg into the muscle every 3 (three) months. Dec 2016    . ondansetron (ZOFRAN ODT) 8 MG disintegrating tablet Take 1 tablet (8 mg total) by mouth every 8 (eight) hours as needed for nausea or vomiting. (Patient not taking: Reported on 11/22/2015) 12 tablet 0  . rizatriptan (MAXALT-MLT) 10 MG disintegrating tablet Take 1 tablet (10 mg total) by mouth as needed for migraine. May repeat in 2 hours if needed (Patient not taking: Reported on 11/22/2015) 9 tablet 11  . SUMAtriptan (IMITREX) 100 MG tablet Take  1 tab at earliest onset of headache.  May repeat once in 2 hours if headache persists or recurs.  Do not exceed 2 tablets in 24 hours (Patient not taking: Reported on 11/22/2015) 10 tablet 2   No current facility-administered medications on file prior to visit.    ALLERGIES: No Known  Allergies  FAMILY HISTORY: History reviewed. No pertinent family history.  SOCIAL HISTORY: Social History   Social History  . Marital Status: Single    Spouse Name: N/A  . Number of Children: N/A  . Years of Education: N/A   Occupational History  . Not on file.   Social History Main Topics  . Smoking status: Never Smoker   . Smokeless tobacco: Not on file  . Alcohol Use: No  . Drug Use: No  . Sexual Activity: No   Other Topics Concern  . Not on file   Social History Narrative    REVIEW OF SYSTEMS: Constitutional: No fevers, chills, or sweats, no generalized fatigue, change in appetite Eyes: No visual changes, double vision, eye pain Ear, nose and throat: No hearing loss, ear pain, nasal congestion, sore throat Cardiovascular: No chest pain, palpitations Respiratory:  No shortness of breath at rest or with exertion, wheezes GastrointestinaI: No nausea, vomiting, diarrhea, abdominal pain, fecal incontinence Genitourinary:  No dysuria, urinary retention or frequency Musculoskeletal:  No neck pain, back pain Integumentary: No rash, pruritus, skin lesions Neurological: as above Psychiatric: No depression, insomnia, anxiety Endocrine: No palpitations, fatigue, diaphoresis, mood swings, change in appetite, change in weight, increased thirst Hematologic/Lymphatic:  No anemia, purpura, petechiae. Allergic/Immunologic: no itchy/runny eyes, nasal congestion, recent allergic reactions, rashes  PHYSICAL EXAM: Filed Vitals:   11/22/15 0948  Pulse: 78   General: No acute distress.  Patient appears well-groomed.  normal body habitus. Head:  Normocephalic/atraumatic Eyes:  Fundi examined but not visualized Neck: supple, no paraspinal tenderness, full range of motion Heart:  Regular rate and rhythm Lungs:  Clear to auscultation bilaterally Back: No paraspinal tenderness Neurological Exam: alert and oriented to person, place, and time. Attention span and concentration intact,  recent and remote memory intact, fund of knowledge intact.  Speech fluent and not dysarthric, language intact.  CN II-XII intact. Bulk and tone normal, muscle strength 5/5 throughout.  Sensation to light touch, temperature and vibration intact.  Deep tendon reflexes 2+ throughout, toes downgoing.  Finger to nose and heel to shin testing intact.  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  Restart topiramate 50mg  at bedtime.  Advised not to get pregnant while taking this. 2.  Will try Relpax 40mg  for abortive therapy 3.  Stop Aleve 4.  Follow up in 3 to 4 months.  She is to call in 4 weeks with update.  15 minutes spent face to face with patient, over 50% spent discussing management.  Shon MilletAdam Elajah Kunsman, DO

## 2015-11-22 NOTE — Patient Instructions (Signed)
Migraine Recommendations: 1.  Start topiramate 50mg  at bedtime.  Call in 4 weeks with update and we can adjust dose if needed.  DO NOT GET PREGNANT WHILE TAKING THIS MEDICATION.  Possible side effects include: impaired thinking, sedation, paresthesias (numbness and tingling) and weight loss.  It may cause dehydration and there is a small risk for kidney stones, so make sure to stay hydrated with water during the day.  There is also a very small risk for glaucoma, so if you notice any change in your vision while taking this medication, see an ophthalmologist.    2.  Take Relpax 40mg  at earliest onset of headache.  May repeat dose once in 2 hours if needed.  Do not exceed two tablets in 24 hours. 3.  Limit use of pain relievers to no more than 2 days out of the week.  These medications include acetaminophen, ibuprofen, triptans and narcotics.  This will help reduce risk of rebound headaches. 4.  Be aware of common food triggers such as processed sweets, processed foods with nitrites (such as deli meat, hot dogs, sausages), foods with MSG, alcohol (such as wine), chocolate, certain cheeses, certain fruits (dried fruits, some citrus fruit), vinegar, diet soda. 4.  Avoid caffeine 5.  Routine exercise 6.  Proper sleep hygiene 7.  Stay adequately hydrated with water 8.  Keep a headache diary. 9.  Maintain proper stress management. 10.  Do not skip meals. 11.  Consider supplements:  Magnesium oxide 400mg  to 600mg  daily, riboflavin 400mg , Coenzyme Q 10 100mg  three times daily 12.  Follow up in 3-4 months.

## 2015-12-05 ENCOUNTER — Emergency Department (HOSPITAL_COMMUNITY)
Admission: EM | Admit: 2015-12-05 | Discharge: 2015-12-05 | Disposition: A | Discharge status: Home / Self Care | Payer: Medicaid Other | Attending: Emergency Medicine | Admitting: Emergency Medicine

## 2015-12-05 ENCOUNTER — Encounter (HOSPITAL_COMMUNITY): Payer: Self-pay | Admitting: Emergency Medicine

## 2015-12-05 DIAGNOSIS — Z79899 Other long term (current) drug therapy: Secondary | ICD-10-CM | POA: Diagnosis not present

## 2015-12-05 DIAGNOSIS — R21 Rash and other nonspecific skin eruption: Secondary | ICD-10-CM | POA: Insufficient documentation

## 2015-12-05 DIAGNOSIS — L282 Other prurigo: Secondary | ICD-10-CM

## 2015-12-05 DIAGNOSIS — L299 Pruritus, unspecified: Secondary | ICD-10-CM | POA: Diagnosis not present

## 2015-12-05 DIAGNOSIS — G43909 Migraine, unspecified, not intractable, without status migrainosus: Secondary | ICD-10-CM | POA: Diagnosis not present

## 2015-12-05 DIAGNOSIS — R221 Localized swelling, mass and lump, neck: Secondary | ICD-10-CM | POA: Diagnosis not present

## 2015-12-05 MED ORDER — HYDROXYZINE HCL 25 MG PO TABS: 25.0000 mg | ORAL_TABLET | Freq: Four times a day (QID) | ORAL | Status: DC

## 2015-12-05 MED ORDER — PREDNISONE 20 MG PO TABS: ORAL_TABLET | ORAL | Status: DC

## 2015-12-05 MED ORDER — FAMOTIDINE 20 MG PO TABS: 20.0000 mg | ORAL_TABLET | Freq: Two times a day (BID) | ORAL | Status: DC

## 2015-12-05 NOTE — ED Provider Notes (Signed)
CSN: [id number]     Arrival date & time 12/05/15  1921 History  By signing my name below, I, Linna DarnerRussell Turner, attest that this documentation has been prepared under the direction and in the presence of non-physician practitioner, Felicie Mornavid Hamed Debella, NP. Electronically Signed: Linna Darnerussell Turner, Scribe. 12/05/2015. 8:09 PM.   Chief Complaint  Patient presents with  . Rash    Patient is a 20 y.o. female presenting with rash. The history is provided by the patient. No language interpreter was used.  Rash Location:  Face, shoulder/arm and torso Facial rash location:  R cheek and L cheek Shoulder/arm rash location:  L arm and R arm Torso rash location:  R chest, L chest, R flank, L flank, abd LLQ, abd LUQ, abd RUQ and abd RLQ Quality: itchiness   Onset quality:  Sudden Duration:  2 days Timing:  Constant Progression:  Unable to specify Chronicity:  New Context: not new detergent/soap   Ineffective treatments:  OTC analgesics Associated symptoms: throat swelling   Associated symptoms: no joint pain and no shortness of breath      HPI Comments: Natasha Martinez is a 20 y.o. female with no significant PMHx who presents to the Emergency Department complaining of generalized pruritic rash onset 2 days. Pt endorses rashes to her bilateral arms, chest, face, and abdomen. Pt has tried Benadryl for her symptoms with no relief. She has not used new soaps or detergents recently. She states that she has been experiencing throat tightness/swelling with intake of tomato-based products; she states that she ate tomatoes not long before her symptoms presented. She denies SOB, pain due to the rashes, or any other associated symptoms. Pt does not have a PCP.  Past Medical History  Diagnosis Date  . Migraine    History reviewed. No pertinent past surgical history. No family history on file. Social History  Substance Use Topics  . Smoking status: Never Smoker   . Smokeless tobacco: None  . Alcohol Use: No   OB  History    No data available     Review of Systems  Respiratory: Negative for shortness of breath.   Musculoskeletal: Negative for arthralgias.  Skin: Positive for rash.  All other systems reviewed and are negative.   Allergies  Review of patient's allergies indicates no known allergies.  Home Medications   Prior to Admission medications   Medication Sig Start Date End Date Taking? Authorizing Provider  albuterol (PROVENTIL HFA;VENTOLIN HFA) 108 (90 BASE) MCG/ACT inhaler Inhale 2 puffs into the lungs 4 (four) times daily. Patient taking differently: Inhale 2 puffs into the lungs every 6 (six) hours as needed for wheezing or shortness of breath.  02/22/13   Reuben Likesavid C Keller, MD  eletriptan (RELPAX) 40 MG tablet Take 1 tablet at earliest onset of headache.  May repeat once in 2 hours if headache persists or recurs.  Do not exceed two tablets in 24 hours 11/22/15   Drema DallasAdam R Jaffe, DO  ibuprofen (ADVIL,MOTRIN) 800 MG tablet Take 1 tablet (800 mg total) by mouth every 8 (eight) hours as needed. 04/09/15   Charlestine Nighthristopher Lawyer, PA-C  medroxyPROGESTERone (DEPO-PROVERA) 150 MG/ML injection Inject 150 mg into the muscle every 3 (three) months. Dec 2016    Historical Provider, MD  ondansetron (ZOFRAN ODT) 8 MG disintegrating tablet Take 1 tablet (8 mg total) by mouth every 8 (eight) hours as needed for nausea or vomiting. Patient not taking: Reported on 11/22/2015 09/14/15   Azalia BilisKevin Campos, MD  rizatriptan (MAXALT-MLT) 10 MG disintegrating tablet  Take 1 tablet (10 mg total) by mouth as needed for migraine. May repeat in 2 hours if needed Patient not taking: Reported on 11/22/2015 08/08/15   Drema Dallas, DO  SUMAtriptan (IMITREX) 100 MG tablet Take 1 tab at earliest onset of headache.  May repeat once in 2 hours if headache persists or recurs.  Do not exceed 2 tablets in 24 hours Patient not taking: Reported on 11/22/2015 08/04/15   Drema Dallas, DO  topiramate (TOPAMAX) 50 MG tablet Take 1 tablet (50 mg total) by  mouth at bedtime. 11/22/15   Drema Dallas, DO   BP 110/70 mmHg  Pulse 97  Temp(Src) 98.7 F (37.1 C) (Oral)  Resp 14  Ht  (1.6 m)  Wt 144 lb (65.318 kg)  BMI 25.51 kg/m2  SpO2 100% Physical Exam  Constitutional: She is oriented to person, place, and time. She appears well-developed and well-nourished. No distress.  HENT:  Head: Normocephalic and atraumatic.  Eyes: Conjunctivae and EOM are normal.  Neck: Neck supple. No tracheal deviation present.  Cardiovascular: Normal rate.   Pulmonary/Chest: Effort normal. No respiratory distress.  Musculoskeletal: Normal range of motion.  Neurological: She is alert and oriented to person, place, and time.  Skin: Skin is warm and dry. Rash noted.  Pruritic papular rash on torso and bilateral arms.  Psychiatric: She has a normal mood and affect. Her behavior is normal.  Nursing note and vitals reviewed.   ED Course  Procedures (including critical care time)  DIAGNOSTIC STUDIES: Oxygen Saturation is 100% on RA, normal by my interpretation.    COORDINATION OF CARE: 8:09 PM Discussed treatment plan with pt at bedside and pt agreed to plan.  Labs Review Labs Reviewed - No data to display  Imaging Review No results found. I have personally reviewed and evaluated these images and lab results as part of my medical decision-making.   EKG Interpretation None      MDM   Final diagnoses:  None        Patient with non-specific eruption. May be due to food sensitivity (tomatoes).  No signs of infection or anaphylactic reaction; no new medications. Will treat with short course of steroids, anti-histamines. Follow up with PCP  in 2-3 days. Return precautions discussed. Pt is safe for discharge at this time.       I personally performed the services described in this documentation, which was scribed in my presence. The recorded information has been reviewed and is accurate.   Felicie Morn, NP 12/06/15 1610  Benjiman Core,  MD 12/06/15 2352

## 2015-12-05 NOTE — ED Notes (Signed)
Pt. reports generalized itchy rashes onset 2 days ago unrelieved by OTC Benadryl , airway intact / respirations unlabored , no oral swelling .

## 2015-12-05 NOTE — Discharge Instructions (Signed)

## 2016-02-29 ENCOUNTER — Ambulatory Visit: Payer: Medicaid Other | Admitting: Neurology

## 2016-05-16 ENCOUNTER — Encounter (HOSPITAL_COMMUNITY): Payer: Self-pay | Admitting: Emergency Medicine

## 2016-05-16 DIAGNOSIS — B9689 Other specified bacterial agents as the cause of diseases classified elsewhere: Secondary | ICD-10-CM | POA: Insufficient documentation

## 2016-05-16 DIAGNOSIS — B373 Candidiasis of vulva and vagina: Secondary | ICD-10-CM | POA: Insufficient documentation

## 2016-05-16 DIAGNOSIS — N760 Acute vaginitis: Secondary | ICD-10-CM | POA: Insufficient documentation

## 2016-05-16 LAB — CBC WITH DIFFERENTIAL/PLATELET
BASOS ABS: 0 10*3/uL (ref 0.0–0.1)
Basophils Relative: 0 %
EOS PCT: 2 %
Eosinophils Absolute: 0.2 10*3/uL (ref 0.0–0.7)
HEMATOCRIT: 38.8 % (ref 36.0–46.0)
Hemoglobin: 12.9 g/dL (ref 12.0–15.0)
LYMPHS ABS: 3.7 10*3/uL (ref 0.7–4.0)
LYMPHS PCT: 54 %
MCH: 28.1 pg (ref 26.0–34.0)
MCHC: 33.2 g/dL (ref 30.0–36.0)
MCV: 84.5 fL (ref 78.0–100.0)
MONO ABS: 0.5 10*3/uL (ref 0.1–1.0)
Monocytes Relative: 7 %
NEUTROS ABS: 2.5 10*3/uL (ref 1.7–7.7)
Neutrophils Relative %: 37 %
Platelets: 310 10*3/uL (ref 150–400)
RBC: 4.59 MIL/uL (ref 3.87–5.11)
RDW: 13.3 % (ref 11.5–15.5)
WBC: 6.9 10*3/uL (ref 4.0–10.5)

## 2016-05-16 LAB — COMPREHENSIVE METABOLIC PANEL
ALT: 16 U/L (ref 14–54)
AST: 25 U/L (ref 15–41)
Albumin: 4.1 g/dL (ref 3.5–5.0)
Alkaline Phosphatase: 57 U/L (ref 38–126)
Anion gap: 7 (ref 5–15)
BILIRUBIN TOTAL: 0.5 mg/dL (ref 0.3–1.2)
BUN: 12 mg/dL (ref 6–20)
CO2: 24 mmol/L (ref 22–32)
CREATININE: 0.86 mg/dL (ref 0.44–1.00)
Calcium: 9.3 mg/dL (ref 8.9–10.3)
Chloride: 107 mmol/L (ref 101–111)
Glucose, Bld: 100 mg/dL — ABNORMAL HIGH (ref 65–99)
POTASSIUM: 3.8 mmol/L (ref 3.5–5.1)
Sodium: 138 mmol/L (ref 135–145)
TOTAL PROTEIN: 6.9 g/dL (ref 6.5–8.1)

## 2016-05-16 LAB — URINALYSIS, ROUTINE W REFLEX MICROSCOPIC
BILIRUBIN URINE: NEGATIVE
Glucose, UA: NEGATIVE mg/dL
KETONES UR: NEGATIVE mg/dL
Leukocytes, UA: NEGATIVE
NITRITE: NEGATIVE
PROTEIN: NEGATIVE mg/dL
Specific Gravity, Urine: 1.029 (ref 1.005–1.030)
pH: 6 (ref 5.0–8.0)

## 2016-05-16 LAB — URINE MICROSCOPIC-ADD ON

## 2016-05-16 LAB — POC URINE PREG, ED: PREG TEST UR: NEGATIVE

## 2016-05-16 NOTE — ED Triage Notes (Signed)
Pt. Reports vaginal bleeding onset today with low abdominal cramping and low back pain , denies fever or chills.

## 2016-05-17 ENCOUNTER — Emergency Department (HOSPITAL_COMMUNITY)
Admission: EM | Admit: 2016-05-17 | Discharge: 2016-05-17 | Disposition: A | Discharge status: Home / Self Care | Payer: Medicaid Other | Attending: Emergency Medicine | Admitting: Emergency Medicine

## 2016-05-17 DIAGNOSIS — B9689 Other specified bacterial agents as the cause of diseases classified elsewhere: Secondary | ICD-10-CM

## 2016-05-17 DIAGNOSIS — N939 Abnormal uterine and vaginal bleeding, unspecified: Secondary | ICD-10-CM

## 2016-05-17 DIAGNOSIS — B3731 Acute candidiasis of vulva and vagina: Secondary | ICD-10-CM

## 2016-05-17 DIAGNOSIS — N760 Acute vaginitis: Secondary | ICD-10-CM

## 2016-05-17 DIAGNOSIS — B373 Candidiasis of vulva and vagina: Secondary | ICD-10-CM

## 2016-05-17 LAB — WET PREP, GENITAL
Sperm: NONE SEEN
TRICH WET PREP: NONE SEEN

## 2016-05-17 LAB — GC/CHLAMYDIA PROBE AMP (~~LOC~~) NOT AT ARMC
Chlamydia: NEGATIVE
Neisseria Gonorrhea: NEGATIVE

## 2016-05-17 MED ORDER — FLUCONAZOLE 100 MG PO TABS
150.0000 mg | ORAL_TABLET | Freq: Once | ORAL | Status: AC
  Administered 2016-05-17: 150 mg via ORAL
  Filled 2016-05-17: qty 2

## 2016-05-17 MED ORDER — IBUPROFEN 800 MG PO TABS
800.0000 mg | ORAL_TABLET | Freq: Once | ORAL | Status: AC
  Administered 2016-05-17: 800 mg via ORAL
  Filled 2016-05-17: qty 1

## 2016-05-17 MED ORDER — ONDANSETRON 4 MG PO TBDP: 4.0000 mg | ORAL_TABLET | Freq: Three times a day (TID) | ORAL | 0 refills | Status: DC | PRN

## 2016-05-17 MED ORDER — IBUPROFEN 800 MG PO TABS: 800.0000 mg | ORAL_TABLET | Freq: Three times a day (TID) | ORAL | 0 refills | Status: DC | PRN

## 2016-05-17 MED ORDER — METRONIDAZOLE 500 MG PO TABS
500.0000 mg | ORAL_TABLET | Freq: Once | ORAL | Status: AC
  Administered 2016-05-17: 500 mg via ORAL
  Filled 2016-05-17: qty 1

## 2016-05-17 MED ORDER — METRONIDAZOLE 500 MG PO TABS: 500.0000 mg | ORAL_TABLET | Freq: Two times a day (BID) | ORAL | 0 refills | Status: DC

## 2016-05-17 NOTE — Discharge Instructions (Signed)
°  [facility] °[email or web address] °[address] °[place], [place] °[phone]  ° ° °[facility] °[email or web address] °[address] °[place], [place] °[phone]  ° ° °[facility] °[address] °[place], [place] °[phone]  ° °[facility] °[email or web address] °[address] °[place], [place] °[phone]  ° °[facility] °[email or web address] °[address] °[place], [place] °[phone]  ° °[facility] °[email or web address] °[address] °[place], [place] °[phone] ° °

## 2016-05-17 NOTE — ED Notes (Signed)
MD at bedside. 

## 2016-05-17 NOTE — ED Notes (Signed)
Patient left at this time with all belongings. 

## 2016-05-17 NOTE — ED Provider Notes (Signed)
By signing my name below, I, Emmanuella Mensah, attest that this documentation has been prepared under the direction and in the presence of Kristen N Ward, DO. Electronically Signed: Angelene GiovanniEmmanuella Mensah, ED Scribe. 05/17/16. 1:04 AM.   TIME SEEN: 12:54 AM    CHIEF COMPLAINT:  Chief Complaint  Patient presents with  . Vaginal Bleeding  . Abdominal Cramping     HPI: Natasha Martinez is a 20 y.o. female who presents to the Emergency Department complaining of gradual onset, persistent moderate suprapubic pain she describes as cramping onset yesterday. She notes that initially her pain radiated towards her back but now it is staying in her suprapubic region. She reports associated mild nausea. No alleviating factors noted. Pt has not tried any medications PTA. She has NKDA. She states that her menstrual cycles have not been regular; pt is currently on her menstrual cycle and had another one cycle on 04/30/16 and prior to that one, her last cycle was at the beginning of September 2017. She adds that her menstrual cycle on 04/30/16 seemed to be heavier than normal. She denies a hx of abdominal surgeries or STDs. Was sexually active one month ago with one partner and use protection. Pt states that has not been pregnant before. She denies any fever, chills, diarrhea, vomiting, dysuria, hematuria, vaginal discharge, or any other symptoms.   No OBGYN   ROS: See HPI Constitutional: no fever  Eyes: no drainage  ENT: no runny nose   Cardiovascular:  no chest pain  Resp: no SOB  GI: no vomiting GU: no dysuria Integumentary: no rash  Allergy: no hives  Musculoskeletal: no leg swelling  Neurological: no slurred speech ROS otherwise negative  PAST MEDICAL HISTORY/PAST SURGICAL HISTORY:  Past Medical History:  Diagnosis Date  . Migraine     MEDICATIONS:  Prior to Admission medications   Medication Sig Start Date End Date Taking? Authorizing Provider  albuterol (PROVENTIL HFA;VENTOLIN HFA) 108 (90  BASE) MCG/ACT inhaler Inhale 2 puffs into the lungs 4 (four) times daily. Patient taking differently: Inhale 2 puffs into the lungs every 6 (six) hours as needed for wheezing or shortness of breath.  02/22/13  Yes Reuben Likesavid C Keller, MD  eletriptan (RELPAX) 40 MG tablet Take 1 tablet at earliest onset of headache.  May repeat once in 2 hours if headache persists or recurs.  Do not exceed two tablets in 24 hours Patient not taking: Reported on 05/16/2016 11/22/15   Drema DallasAdam R Jaffe, DO  famotidine (PEPCID) 20 MG tablet Take 1 tablet (20 mg total) by mouth 2 (two) times daily. Patient not taking: Reported on 05/16/2016 12/05/15   Felicie Mornavid Smith, NP  hydrOXYzine (ATARAX/VISTARIL) 25 MG tablet Take 1 tablet (25 mg total) by mouth every 6 (six) hours. Patient not taking: Reported on 05/16/2016 12/05/15   Felicie Mornavid Smith, NP  ibuprofen (ADVIL,MOTRIN) 800 MG tablet Take 1 tablet (800 mg total) by mouth every 8 (eight) hours as needed. Patient not taking: Reported on 05/16/2016 04/09/15   Charlestine Nighthristopher Lawyer, PA-C  ondansetron (ZOFRAN ODT) 8 MG disintegrating tablet Take 1 tablet (8 mg total) by mouth every 8 (eight) hours as needed for nausea or vomiting. Patient not taking: Reported on 05/16/2016 09/14/15   Azalia BilisKevin Campos, MD  predniSONE (DELTASONE) 20 MG tablet 3 tabs po day one, then 2 tabs daily x 4 days Patient not taking: Reported on 05/16/2016 12/05/15   Felicie Mornavid Smith, NP  rizatriptan (MAXALT-MLT) 10 MG disintegrating tablet Take 1 tablet (10 mg total) by mouth as needed  for migraine. May repeat in 2 hours if needed Patient not taking: Reported on 05/16/2016 08/08/15   Drema Dallas, DO  SUMAtriptan (IMITREX) 100 MG tablet Take 1 tab at earliest onset of headache.  May repeat once in 2 hours if headache persists or recurs.  Do not exceed 2 tablets in 24 hours Patient not taking: Reported on 05/16/2016 08/04/15   Drema Dallas, DO  topiramate (TOPAMAX) 50 MG tablet Take 1 tablet (50 mg total) by mouth at bedtime. Patient not  taking: Reported on 05/16/2016 11/22/15   Drema Dallas, DO    ALLERGIES:  No Known Allergies  SOCIAL HISTORY:  Social History  Substance Use Topics  . Smoking status: Never Smoker  . Smokeless tobacco: Never Used  . Alcohol use No    FAMILY HISTORY: No family history on file.  EXAM: BP 108/71 (BP Location: Right Arm)   Pulse 66   Temp 98.6 F (37 C) (Oral)   Resp 19   Ht 5\' 4"  (1.626 m)   Wt 140 lb (63.5 kg)   LMP 04/30/2016   SpO2 97%   BMI 24.03 kg/m  CONSTITUTIONAL: Alert and oriented and responds appropriately to questions. Well-appearing; well-nourished HEAD: Normocephalic EYES: Conjunctivae clear, PERRL ENT: normal nose; no rhinorrhea; moist mucous membranes NECK: Supple, no meningismus, no LAD  CARD: RRR; S1 and S2 appreciated; no murmurs, no clicks, no rubs, no gallops RESP: Normal chest excursion without splinting or tachypnea; breath sounds clear and equal bilaterally; no wheezes, no rhonchi, no rales, no hypoxia or respiratory distress, speaking full sentences ABD/GI: Normal bowel sounds; non-distended; soft, non-tender, no rebound, no guarding, no peritoneal signs GU:  Normal external genitalia. No lesions, rashes noted. Patient has small amount of dark red vaginal bleeding on exam coming from the cervical os. No appreciable vaginal discharge. Does have some vaginal odor.  No adnexal tenderness, mass or fullness, no cervical motion tenderness. Cervix is not appear friable.  Cervix is closed.  Chaperone present for exam. BACK:  The back appears normal and is non-tender to palpation, there is no CVA tenderness EXT: Normal ROM in all joints; non-tender to palpation; no edema; normal capillary refill; no cyanosis, no calf tenderness or swelling    SKIN: Normal color for age and race; warm; no rash NEURO: Moves all extremities equally, sensation to light touch intact diffusely, cranial nerves II through XII intact PSYCH: The patient's mood and manner are appropriate.  Grooming and personal hygiene are appropriate.  MEDICAL DECISION MAKING: Patient here with lower abdominal pain, cramping, back pain. Recently started bleeding. I suspect this is an abnormal menstrual cycle for her. Abdominal exam is benign. Pelvic exam benign. Wet prep is positive for clue cells and yeast. Will treat. Urine shows no sign of infection and she is not pregnant. Doubt appendicitis, colitis, diverticulitis, torsion, PID, TOA. I do not feel she needs emergent imaging of her abdomen at this time. Have advised her to alternate Tylenol and Motrin. We'll give Diflucan the emergency department and discharge with prescription for Flagyl. We'll give her outpatient OB/GYN follow-up information.   At this time, I do not feel there is any life-threatening condition present. I have reviewed and discussed all results (EKG, imaging, lab, urine as appropriate), exam findings with patient/family. I have reviewed nursing notes and appropriate previous records.  I feel the patient is safe to be discharged home without further emergent workup and can continue workup as an outpatient as needed. Discussed usual and customary return precautions.  Patient/family verbalize understanding and are comfortable with this plan.  Outpatient follow-up has been provided. All questions have been answered.   I personally performed the services described in this documentation, which was scribed in my presence. The recorded information has been reviewed and is accurate.    Layla Maw Ward, DO 05/17/16 [phone]

## 2017-01-16 ENCOUNTER — Emergency Department (HOSPITAL_COMMUNITY)
Admission: EM | Admit: 2017-01-16 | Discharge: 2017-01-16 | Disposition: A | Discharge status: Home / Self Care | Payer: Medicaid Other | Attending: Emergency Medicine | Admitting: Emergency Medicine

## 2017-01-16 ENCOUNTER — Encounter (HOSPITAL_COMMUNITY): Payer: Self-pay

## 2017-01-16 DIAGNOSIS — Z79899 Other long term (current) drug therapy: Secondary | ICD-10-CM | POA: Insufficient documentation

## 2017-01-16 DIAGNOSIS — R102 Pelvic and perineal pain: Secondary | ICD-10-CM | POA: Insufficient documentation

## 2017-01-16 LAB — I-STAT BETA HCG BLOOD, ED (MC, WL, AP ONLY): I-stat hCG, quantitative: <5 m[IU]/mL (ref <5)

## 2017-01-16 LAB — COMPREHENSIVE METABOLIC PANEL
ALT: 17 U/L (ref 14–54)
AST: 26 U/L (ref 15–41)
Albumin: 3.9 g/dL (ref 3.5–5.0)
Alkaline Phosphatase: 51 U/L (ref 38–126)
Anion gap: 5 (ref 5–15)
BILIRUBIN TOTAL: 0.4 mg/dL (ref 0.3–1.2)
BUN: 15 mg/dL (ref 6–20)
CHLORIDE: 107 mmol/L (ref 101–111)
CO2: 24 mmol/L (ref 22–32)
Calcium: 9.1 mg/dL (ref 8.9–10.3)
Creatinine, Ser: 0.89 mg/dL (ref 0.44–1.00)
GFR calc Af Amer: >60 mL/min (ref >60)
GLUCOSE: 97 mg/dL (ref 65–99)
POTASSIUM: 3.4 mmol/L — AB (ref 3.5–5.1)
Sodium: 136 mmol/L (ref 135–145)
Total Protein: 7 g/dL (ref 6.5–8.1)

## 2017-01-16 LAB — CBC
HEMATOCRIT: 37.9 % (ref 36.0–46.0)
Hemoglobin: 12.3 g/dL (ref 12.0–15.0)
MCH: 28.2 pg (ref 26.0–34.0)
MCHC: 32.5 g/dL (ref 30.0–36.0)
MCV: 86.9 fL (ref 78.0–100.0)
PLATELETS: 288 10*3/uL (ref 150–400)
RBC: 4.36 MIL/uL (ref 3.87–5.11)
RDW: 13.3 % (ref 11.5–15.5)
WBC: 5.3 10*3/uL (ref 4.0–10.5)

## 2017-01-16 LAB — URINALYSIS, ROUTINE W REFLEX MICROSCOPIC
BILIRUBIN URINE: NEGATIVE
GLUCOSE, UA: NEGATIVE mg/dL
HGB URINE DIPSTICK: NEGATIVE
KETONES UR: 5 mg/dL — AB
LEUKOCYTES UA: NEGATIVE
Nitrite: NEGATIVE
PH: 5 (ref 5.0–8.0)
PROTEIN: NEGATIVE mg/dL
Specific Gravity, Urine: 1.018 (ref 1.005–1.030)

## 2017-01-16 LAB — LIPASE, BLOOD: LIPASE: 24 U/L (ref 11–51)

## 2017-01-16 LAB — WET PREP, GENITAL
SPERM: NONE SEEN
Trich, Wet Prep: NONE SEEN
YEAST WET PREP: NONE SEEN

## 2017-01-16 NOTE — ED Provider Notes (Signed)
[facility]-EMERGENCY DEPT Provider Note   CSN: [id number] Arrival date & time: 01/16/17  1600  By signing my name below, I, Doreatha Martin, attest that this documentation has been prepared under the direction and in the presence of  Terance Hart, PA-C. Electronically Signed: Doreatha Martin, ED Scribe. 01/16/17. 7:04 PM.    History   Chief Complaint Chief Complaint  Patient presents with  . Abdominal Pain    HPI Natasha Martinez is a 21 y.o. female who presents to the Emergency Department complaining of moderate, intermittent lower abdominal pain x 1 week. She describes her pain as "tightness" and there are no worsening or alleviating factors noted. At its worst her pain is 10/10, but she states her pain is currently 5/10. No treatments tried PTA. She states she normally gets abdominal pain before menses, but has not gotten her period this month. LMP was in the beginning of May 2018. She also states she had to take a Plan B last month but did not have any adverse reactions after taking this med. She states upon discontinuing birth control last year, her periods have been regular. No h/o abdominal surgeries or pregnancies. She is currently sexually active, but doubts STD. She denies fever, nausea, vomiting, constipation, diarrhea, vaginal discharge, dysuria, frequency.   The history is provided by the patient. No language interpreter was used.    Past Medical History:  Diagnosis Date  . Migraine     Patient Active Problem List   Diagnosis Date Noted  . Chronic migraine w/o aura w/o status migrainosus, not intractable 08/04/2015  . History of seizures as a child 08/04/2015    History reviewed. No pertinent surgical history.  OB History    No data available       Home Medications    Prior to Admission medications   Medication Sig Start Date End Date Taking? Authorizing Provider  albuterol (PROVENTIL HFA;VENTOLIN HFA) 108 (90 BASE) MCG/ACT inhaler Inhale 2 puffs into the lungs 4 (four) times  daily. Patient taking differently: Inhale 2 puffs into the lungs every 6 (six) hours as needed for wheezing or shortness of breath.  02/22/13   Reuben Likes, MD  ibuprofen (ADVIL,MOTRIN) 800 MG tablet Take 1 tablet (800 mg total) by mouth every 8 (eight) hours as needed for mild pain. 05/17/16   Ward, Layla Maw, DO  metroNIDAZOLE (FLAGYL) 500 MG tablet Take 1 tablet (500 mg total) by mouth 2 (two) times daily. Do not drink alcohol with this medication. 05/17/16   Ward, Layla Maw, DO  ondansetron (ZOFRAN ODT) 4 MG disintegrating tablet Take 1 tablet (4 mg total) by mouth every 8 (eight) hours as needed for nausea or vomiting. 05/17/16   Ward, Layla Maw, DO    Family History No family history on file.  Social History Social History  Substance Use Topics  . Smoking status: Never Smoker  . Smokeless tobacco: Never Used  . Alcohol use No     Allergies   Patient has no known allergies.   Review of Systems Review of Systems  Constitutional: Negative for fever.  Gastrointestinal: Positive for abdominal pain. Negative for constipation, diarrhea, nausea and vomiting.  Genitourinary: Negative for dysuria, frequency and vaginal discharge.  All other systems reviewed and are negative.    Physical Exam Updated Vital Signs BP 112/80   Pulse 61   Temp 98.4 F (36.9 C) (Oral)   Resp 15   SpO2 100%   Physical Exam  Constitutional: She is oriented to person, place, and  time. She appears well-developed and well-nourished. No distress.  Calm, comfortable appearing  HENT:  Head: Normocephalic and atraumatic.  Eyes: Conjunctivae are normal. Pupils are equal, round, and reactive to light. Right eye exhibits no discharge. Left eye exhibits no discharge. No scleral icterus.  Neck: Normal range of motion.  Cardiovascular: Normal rate and regular rhythm.  Exam reveals no gallop and no friction rub.   No murmur heard. Pulmonary/Chest: Effort normal and breath sounds normal. No respiratory  distress. She has no wheezes. She has no rales. She exhibits no tenderness.  Abdominal: Soft. Bowel sounds are normal. She exhibits no distension and no mass. There is tenderness (Mild bilateral pelvic tenderness and suprapubic tenderness). There is no rebound and no guarding. No hernia.  Genitourinary:  Genitourinary Comments: No inguinal lymphadenopathy or inguinal hernia noted. Normal external genitalia. No pain with speculum insertion. Closed cervical os with normal appearance - no rash or lesions. No significant discharge or bleeding noted from cervix or in vaginal vault. On bimanual examination no adnexal tenderness or cervical motion tenderness. Chaperone present during exam.    Musculoskeletal: Normal range of motion.  Neurological: She is alert and oriented to person, place, and time.  Skin: Skin is warm and dry.  Psychiatric: She has a normal mood and affect. Her behavior is normal.  Nursing note and vitals reviewed.    ED Treatments / Results   DIAGNOSTIC STUDIES: Oxygen Saturation is 99% on RA, normal by my interpretation.    COORDINATION OF CARE: 7:02 PM Discussed treatment plan with pt at bedside which includes labs and pt agreed to plan.    Labs (all labs ordered are listed, but only abnormal results are displayed) Labs Reviewed  WET PREP, GENITAL - Abnormal; Notable for the following:       Result Value   Clue Cells Wet Prep HPF POC PRESENT (*)    WBC, Wet Prep HPF POC MODERATE (*)    All other components within normal limits  COMPREHENSIVE METABOLIC PANEL - Abnormal; Notable for the following:    Potassium 3.4 (*)    All other components within normal limits  URINALYSIS, ROUTINE W REFLEX MICROSCOPIC - Abnormal; Notable for the following:    APPearance HAZY (*)    Ketones, ur 5 (*)    All other components within normal limits  LIPASE, BLOOD  CBC  I-STAT BETA HCG BLOOD, ED ([facility], [facility], [facility] ONLY)  GC/CHLAMYDIA PROBE AMP ([place]) NOT AT [facility]    EKG  EKG  Interpretation None       Radiology No results found.  Procedures Procedures (including critical care time)  Medications Ordered in ED Medications - No data to display   Initial Impression / Assessment and Plan / ED Course  I have reviewed the triage vital signs and the nursing notes.  Pertinent labs & imaging results that were available during my care of the patient were reviewed by me and considered in my medical decision making (see chart for details).  21 year old female with pelvic pain. Unclear etiology - possibly her period may be starting and she does note that it feels similar to period cramps. Vitals are normal. Exam is remarkable for mild tenderness. Labs are overall unremarkable. UA is clean. Wet prep has clue cells and moderate WBC but she denies any vaginal discharge or odor. G&C was sent. Advised her to f/u with OBGYN if she has continued symptoms and to take OTC pain meds. Return precautions given.  Final Clinical Impressions(s) /  ED Diagnoses   Final diagnoses:  Pelvic pain    New Prescriptions New Prescriptions   No medications on file   I personally performed the services described in this documentation, which was scribed in my presence. The recorded information has been reviewed and is accurate.    Bethel Born, PA-C 01/18/17 1124    Loren Racer, MD 01/18/17 [phone]

## 2017-01-16 NOTE — Discharge Instructions (Signed)
Take Ibuprofen for pain Follow up with OBGYN

## 2017-01-16 NOTE — ED Triage Notes (Signed)
patient complains of 1 week of lower abdominal pain  With intermittent nausea. States last menstrual cycle was early May. No vaginal discharge, denies vaginal bleeding. Alert and oriented, NAD

## 2017-01-17 LAB — GC/CHLAMYDIA PROBE AMP (~~LOC~~) NOT AT ARMC
CHLAMYDIA, DNA PROBE: NEGATIVE
Neisseria Gonorrhea: NEGATIVE

## 2017-01-26 ENCOUNTER — Encounter (HOSPITAL_COMMUNITY): Payer: Self-pay | Admitting: Emergency Medicine

## 2017-01-26 ENCOUNTER — Emergency Department (HOSPITAL_COMMUNITY)
Admission: EM | Admit: 2017-01-26 | Discharge: 2017-01-27 | Disposition: A | Discharge status: Home / Self Care | Payer: Medicaid Other | Attending: Emergency Medicine | Admitting: Emergency Medicine

## 2017-01-26 DIAGNOSIS — Z79899 Other long term (current) drug therapy: Secondary | ICD-10-CM | POA: Insufficient documentation

## 2017-01-26 DIAGNOSIS — G43001 Migraine without aura, not intractable, with status migrainosus: Secondary | ICD-10-CM

## 2017-01-26 MED ORDER — OXYCODONE-ACETAMINOPHEN 5-325 MG PO TABS
1.0000 | ORAL_TABLET | Freq: Once | ORAL | Status: AC
  Administered 2017-01-26: 1 via ORAL

## 2017-01-26 MED ORDER — OXYCODONE-ACETAMINOPHEN 5-325 MG PO TABS
ORAL_TABLET | ORAL | Status: AC
  Filled 2017-01-26: qty 1

## 2017-01-26 MED ORDER — BUPIVACAINE HCL (PF) 0.5 % IJ SOLN
10.0000 mL | Freq: Once | INTRAMUSCULAR | Status: AC
  Administered 2017-01-26: 10 mL
  Filled 2017-01-26: qty 10

## 2017-01-26 NOTE — ED Triage Notes (Signed)
Patient reports migraine headache with fatigue , body aches and mild chills onset yesterday unrelieved by OTC pain medications .

## 2017-01-26 NOTE — ED Provider Notes (Signed)
[facility]-EMERGENCY DEPT Provider Note   CSN: [id number] Arrival date & time: 01/26/17  1907 By signing my name below, I, [name], attest that this documentation has been prepared under the direction and in the presence of [name], MD . Electronically Signed: [name], Scribe. 01/26/2017. 11:17 PM.   History   Chief Complaint Chief Complaint  Patient presents with  . Migraine   HPI Natasha Martinez is a 21 y.o. female with a history of chronic migraine who presents to the Emergency Department complaining of gradual onset, constant, severe headache onset yesterday. Pt states she woke up yesterday with eye pain, and then began experiencing a throbbing, diffuse headache. She reports associated photophobia, nausea, visual disturbances and chills. She has taken Excedrin with no relief. Pt is currently not taking any daily migraine medications. Pt states she gets migraines daily, but has not seen her neurologist in a while. Per pt, her headache today is more intense than her typical migraines and has not been able to eat which is unusual for her. She is not currently on her period and does nto take hormonal birth control pills. No family hx of cerebral hemorrhage. She denies any hematuria, dysuria, fever, cough, sore throat, rhinorrhea, or any recent illness.   The history is provided by the patient. No language interpreter was used.   Past Medical History:  Diagnosis Date  . Migraine     Patient Active Problem List   Diagnosis Date Noted  . Chronic migraine w/o aura w/o status migrainosus, not intractable 08/04/2015  . History of seizures as a child 08/04/2015    History reviewed. No pertinent surgical history.  OB History    No data available      Home Medications    Prior to Admission medications   Medication Sig Start Date End Date Taking? Authorizing Provider  albuterol (PROVENTIL HFA;VENTOLIN HFA) 108 (90 BASE) MCG/ACT inhaler Inhale 2 puffs into the lungs 4 (four)  times daily. Patient taking differently: Inhale 2 puffs into the lungs every 6 (six) hours as needed for wheezing or shortness of breath.  02/22/13   Reuben Likes, MD  ibuprofen (ADVIL,MOTRIN) 600 MG tablet Take 1 tablet (600 mg total) by mouth every 8 (eight) hours as needed for mild pain. 01/27/17 02/26/17  [name], MD  metroNIDAZOLE (FLAGYL) 500 MG tablet Take 1 tablet (500 mg total) by mouth 2 (two) times daily. Do not drink alcohol with this medication. 05/17/16   Ward, Layla Maw, DO  ondansetron (ZOFRAN ODT) 4 MG disintegrating tablet Take 1 tablet (4 mg total) by mouth every 8 (eight) hours as needed for nausea or vomiting. 05/17/16   Ward, Layla Maw, DO    Family History No family history on file.  Social History Social History  Substance Use Topics  . Smoking status: Never Smoker  . Smokeless tobacco: Never Used  . Alcohol use No     Allergies   Patient has no known allergies.   Review of Systems Review of Systems All systems reviewed and are negative for acute change except as noted in the HPI.  Physical Exam Updated Vital Signs BP 101/64   Pulse 73   Temp 99.1 F (37.3 C) (Oral)   Resp 16   Ht 5\' 3"  (1.6 m)   Wt 63.5 kg (140 lb)   SpO2 97%   BMI 24.80 kg/m   Physical Exam  Constitutional: She is oriented to person, place, and time. She appears well-developed and well-nourished. No distress.  HENT:  Head: Normocephalic and atraumatic.  Eyes: Conjunctivae are normal.  Cardiovascular: Normal rate and regular rhythm.   Pulmonary/Chest: Effort normal.  Lungs CTA bilaterally   Abdominal: She exhibits no distension.  Neurological: She is alert and oriented to person, place, and time.  CN 2-12 intact  Skin: Skin is warm and dry.  Nursing note and vitals reviewed.  ED Treatments / Results  DIAGNOSTIC STUDIES:  Oxygen Saturation is 99% on RA, normal by my interpretation.    COORDINATION OF CARE:  11:15 PM Discussed treatment plan which includes  Sphenopalatine Ganglion nerve block with pt at bedside and pt agreed to plan.   Labs (all labs ordered are listed, but only abnormal results are displayed) Labs Reviewed - No data to display  EKG  EKG Interpretation None       Radiology No results found.  Procedures .Nerve Block Date/Time: 01/26/2017 11:55 PM Performed by: Derwood KaplanNANAVATI, Basil Buffin Authorized by: Derwood KaplanNANAVATI, Laderius Valbuena   Consent:    Consent obtained:  Verbal   Consent given by:  Patient   Risks discussed:  Bleeding and unsuccessful block Indications:    Indications:  Pain relief Location:    Body area:  Head   Head nerve blocked: sphenopalatine ganglion.   Laterality:  Bilateral Procedure details (see MAR for exact dosages):    Anesthetic injected:  Bupivacaine 0.5% w/o epi   Steroid injected:  None   Injection procedure:  Anatomic landmarks identified Post-procedure details:    Patient tolerance of procedure:  Tolerated well, no immediate complications Comments:     CPT code: [id number] or [phone]    (including critical care time)  Medications Ordered in ED Medications  oxyCODONE-acetaminophen (PERCOCET/ROXICET) 5-325 MG per tablet 1 tablet (1 tablet Oral Given 01/26/17 1933)  bupivacaine (MARCAINE) 0.5 % injection 10 mL (10 mLs Infiltration Given by Other 01/26/17 2317)     Initial Impression / Assessment and Plan / ED Course  I have reviewed the triage vital signs and the nursing notes.  Pertinent labs & imaging results that were available during my care of the patient were reviewed by me and considered in my medical decision making (see chart for details).  Clinical Course as of Jan 28 55  Wynelle LinkSun Jan 27, 2017  0043 Pt's headache haas now completely resolved  [EH]    Clinical Course User Index [EH] Levon HedgerHall, Elizabeth   Pt comes in with migraine headaches. She has hx of migraine and the current symptoms are similar. No focal neuro complains. We will attempt sphenopalatine ganglion nerve block.  Final Clinical  Impressions(s) / ED Diagnoses   Final diagnoses:  Migraine without aura and with status migrainosus, not intractable    New Prescriptions Current Discharge Medication List     I personally performed the services described in this documentation, which was scribed in my presence. The recorded information has been reviewed and is accurate.     Derwood KaplanNanavati, Kayen Grabel, MD 01/27/17 [phone]

## 2017-01-27 MED ORDER — IBUPROFEN 600 MG PO TABS: 600.0000 mg | ORAL_TABLET | Freq: Three times a day (TID) | ORAL | 0 refills | Status: AC | PRN

## 2017-01-27 NOTE — ED Notes (Signed)
Graham crackers and ginger ale provided. 

## 2017-01-27 NOTE — Discharge Instructions (Signed)
We are glad the headache is better. Please see Neurologist for optimal care.  Please return to the ER if the headache gets severe and in not improving.

## 2018-03-06 ENCOUNTER — Emergency Department (HOSPITAL_COMMUNITY)
Admission: EM | Admit: 2018-03-06 | Discharge: 2018-03-07 | Disposition: A | Discharge status: Home / Self Care | Payer: Self-pay | Attending: Emergency Medicine | Admitting: Emergency Medicine

## 2018-03-06 ENCOUNTER — Other Ambulatory Visit: Payer: Self-pay

## 2018-03-06 ENCOUNTER — Encounter (HOSPITAL_COMMUNITY): Payer: Self-pay | Admitting: Emergency Medicine

## 2018-03-06 DIAGNOSIS — R1030 Lower abdominal pain, unspecified: Secondary | ICD-10-CM | POA: Insufficient documentation

## 2018-03-06 DIAGNOSIS — Z79899 Other long term (current) drug therapy: Secondary | ICD-10-CM | POA: Insufficient documentation

## 2018-03-06 DIAGNOSIS — O219 Vomiting of pregnancy, unspecified: Secondary | ICD-10-CM

## 2018-03-06 DIAGNOSIS — Z3A01 Less than 8 weeks gestation of pregnancy: Secondary | ICD-10-CM

## 2018-03-06 DIAGNOSIS — O26891 Other specified pregnancy related conditions, first trimester: Secondary | ICD-10-CM | POA: Insufficient documentation

## 2018-03-06 NOTE — ED Triage Notes (Signed)
Pt has been feeling nauseated since beginning of August. Attributed this to normal "PMS" but then was late having her period and it only lasted a day which is unusual.  Last normal menstrual cycle was beginning of July.

## 2018-03-07 ENCOUNTER — Emergency Department (HOSPITAL_COMMUNITY): Payer: Self-pay

## 2018-03-07 LAB — WET PREP, GENITAL
SPERM: NONE SEEN
Trich, Wet Prep: NONE SEEN
YEAST WET PREP: NONE SEEN

## 2018-03-07 LAB — URINALYSIS, ROUTINE W REFLEX MICROSCOPIC
Bilirubin Urine: NEGATIVE
GLUCOSE, UA: NEGATIVE mg/dL
Hgb urine dipstick: NEGATIVE
KETONES UR: 80 mg/dL — AB
LEUKOCYTES UA: NEGATIVE
Nitrite: NEGATIVE
PH: 6 (ref 5.0–8.0)
PROTEIN: NEGATIVE mg/dL
Specific Gravity, Urine: 1.024 (ref 1.005–1.030)

## 2018-03-07 LAB — COMPREHENSIVE METABOLIC PANEL
ALBUMIN: 3.8 g/dL (ref 3.5–5.0)
ALT: 11 U/L (ref 0–44)
AST: 19 U/L (ref 15–41)
Alkaline Phosphatase: 36 U/L — ABNORMAL LOW (ref 38–126)
Anion gap: 7 (ref 5–15)
BILIRUBIN TOTAL: 0.6 mg/dL (ref 0.3–1.2)
BUN: 9 mg/dL (ref 6–20)
CHLORIDE: 105 mmol/L (ref 98–111)
CO2: 22 mmol/L (ref 22–32)
CREATININE: 0.77 mg/dL (ref 0.44–1.00)
Calcium: 9.3 mg/dL (ref 8.9–10.3)
GFR calc Af Amer: >60 mL/min (ref >60)
GLUCOSE: 129 mg/dL — AB (ref 70–99)
Potassium: 3.4 mmol/L — ABNORMAL LOW (ref 3.5–5.1)
Sodium: 134 mmol/L — ABNORMAL LOW (ref 135–145)
TOTAL PROTEIN: 6.8 g/dL (ref 6.5–8.1)

## 2018-03-07 LAB — I-STAT BETA HCG BLOOD, ED (MC, WL, AP ONLY): I-stat hCG, quantitative: >2000 m[IU]/mL — ABNORMAL HIGH (ref <5)

## 2018-03-07 LAB — CBC
HEMATOCRIT: 35.9 % — AB (ref 36.0–46.0)
Hemoglobin: 11.4 g/dL — ABNORMAL LOW (ref 12.0–15.0)
MCH: 27.7 pg (ref 26.0–34.0)
MCHC: 31.8 g/dL (ref 30.0–36.0)
MCV: 87.3 fL (ref 78.0–100.0)
PLATELETS: 303 10*3/uL (ref 150–400)
RBC: 4.11 MIL/uL (ref 3.87–5.11)
RDW: 13 % (ref 11.5–15.5)
WBC: 5.8 10*3/uL (ref 4.0–10.5)

## 2018-03-07 LAB — GC/CHLAMYDIA PROBE AMP (~~LOC~~) NOT AT ARMC
CHLAMYDIA, DNA PROBE: NEGATIVE
NEISSERIA GONORRHEA: NEGATIVE

## 2018-03-07 LAB — LIPASE, BLOOD: Lipase: 25 U/L (ref 11–51)

## 2018-03-07 MED ORDER — METOCLOPRAMIDE HCL 5 MG/ML IJ SOLN
5.0000 mg | Freq: Once | INTRAMUSCULAR | Status: AC
  Administered 2018-03-07: 5 mg via INTRAVENOUS
  Filled 2018-03-07: qty 2

## 2018-03-07 MED ORDER — SODIUM CHLORIDE 0.9 % IV BOLUS
1000.0000 mL | Freq: Once | INTRAVENOUS | Status: AC
  Administered 2018-03-07: 1000 mL via INTRAVENOUS

## 2018-03-07 MED ORDER — ACETAMINOPHEN 325 MG PO TABS
650.0000 mg | ORAL_TABLET | Freq: Once | ORAL | Status: AC
  Administered 2018-03-07: 650 mg via ORAL
  Filled 2018-03-07: qty 2

## 2018-03-07 MED ORDER — METOCLOPRAMIDE HCL 5 MG PO TABS: 5.0000 mg | ORAL_TABLET | Freq: Four times a day (QID) | ORAL | 0 refills | Status: DC

## 2018-03-07 NOTE — ED Notes (Addendum)
Pt discharged from ED; instructions provided and scripts given; Pt encouraged to return to ED if symptoms worsen and to f/u with PCP; Pt verbalized understanding of all instructions 

## 2018-03-07 NOTE — ED Provider Notes (Signed)
[facility] EMERGENCY DEPARTMENT Provider Note   CSN: [id number] Arrival date & time: 03/06/18  2320     History   Chief Complaint Chief Complaint  Patient presents with  . Abdominal Pain    HPI Natasha Martinez is a 22 y.o. female.  HPI 23 year old African-American female with no pertinent past medical history presents to the emergency department today for evaluation of lower abdominal pain and nausea/emesis.  Patient states that she has been having some lower abdominal pain for the past 3 days.  Describes the pain as aching in nature.  Does not radiate.  She reports having nausea with the smell and food.  She states that her last menstrual cycle was in the beginning of July.  States she has some small amount of spotting in August but this resolved.  She states that she is sexually active but uses protection has no concern for STD.  She denies any vaginal bleeding or discharge.  Denies any urinary symptoms.  Denies any fevers.  She has not taken anything for her pain.  Nothing makes her symptoms better or worse.  Denies any history of prior pregnancy.  Pt denies any fever, chill, ha, vision changes, lightheadedness, dizziness, congestion, neck pain, cp, sob, cough, urinary symptoms, change in bowel habits, melena, hematochezia, lower extremity paresthesias.  Past Medical History:  Diagnosis Date  . Migraine     Patient Active Problem List   Diagnosis Date Noted  . Chronic migraine w/o aura w/o status migrainosus, not intractable 08/04/2015  . History of seizures as a child 08/04/2015    History reviewed. No pertinent surgical history.   OB History   None      Home Medications    Prior to Admission medications   Medication Sig Start Date End Date Taking? Authorizing Provider  albuterol (PROVENTIL HFA;VENTOLIN HFA) 108 (90 BASE) MCG/ACT inhaler Inhale 2 puffs into the lungs 4 (four) times daily. Patient taking differently: Inhale 2 puffs into the lungs  every 6 (six) hours as needed for wheezing or shortness of breath.  02/22/13   Reuben Likes, MD  metroNIDAZOLE (FLAGYL) 500 MG tablet Take 1 tablet (500 mg total) by mouth 2 (two) times daily. Do not drink alcohol with this medication. 05/17/16   Ward, Layla Maw, DO  ondansetron (ZOFRAN ODT) 4 MG disintegrating tablet Take 1 tablet (4 mg total) by mouth every 8 (eight) hours as needed for nausea or vomiting. 05/17/16   Ward, Layla Maw, DO    Family History No family history on file.  Social History Social History   Tobacco Use  . Smoking status: Never Smoker  . Smokeless tobacco: Never Used  Substance Use Topics  . Alcohol use: No  . Drug use: No     Allergies   Patient has no known allergies.   Review of Systems Review of Systems  All other systems reviewed and are negative.    Physical Exam Updated Vital Signs BP 120/85 (BP Location: Right Arm)   Pulse 95   Temp 98.8 F (37.1 C) (Oral)   Resp 16   SpO2 100%   Physical Exam  Constitutional: She is oriented to person, place, and time. She appears well-developed and well-nourished.  Non-toxic appearance. No distress.  HENT:  Head: Normocephalic and atraumatic.  Nose: Nose normal.  Mouth/Throat: Oropharynx is clear and moist.  Eyes: Pupils are equal, round, and reactive to light. Conjunctivae are normal. Right eye exhibits no discharge. Left eye exhibits no discharge.  Neck:  Normal range of motion. Neck supple.  Cardiovascular: Normal rate, regular rhythm, normal heart sounds and intact distal pulses.  Pulmonary/Chest: Effort normal and breath sounds normal. No respiratory distress. She exhibits no tenderness.  Abdominal: Soft. Normal appearance and bowel sounds are normal. There is no tenderness. There is no rigidity, no rebound, no guarding, no CVA tenderness, no tenderness at McBurney's point and negative Murphy's sign.  Genitourinary:  Genitourinary Comments: Chaperone present for exam. No external lesions,  swelling, erythema, or rash of the labia. No erythema, discharge, bleeding, or lesions noted in the vaginal vault. No CMT tenderness, bleeding or friability. No adnexal tenderness, mass or fullness bilaterally. No inguinal adenopathy or hernia.    Musculoskeletal: Normal range of motion. She exhibits no tenderness.  Lymphadenopathy:    She has no cervical adenopathy.  Neurological: She is alert and oriented to person, place, and time.  Skin: Skin is warm and dry. Capillary refill takes less than 2 seconds.  Psychiatric: Her behavior is normal. Judgment and thought content normal.  Nursing note and vitals reviewed.    ED Treatments / Results  Labs (all labs ordered are listed, but only abnormal results are displayed) Labs Reviewed  COMPREHENSIVE METABOLIC PANEL - Abnormal; Notable for the following components:      Result Value   Sodium 134 (*)    Potassium 3.4 (*)    Glucose, Bld 129 (*)    Alkaline Phosphatase 36 (*)    All other components within normal limits  CBC - Abnormal; Notable for the following components:   Hemoglobin 11.4 (*)    HCT 35.9 (*)    All other components within normal limits  URINALYSIS, ROUTINE W REFLEX MICROSCOPIC - Abnormal; Notable for the following components:   APPearance HAZY (*)    Ketones, ur 80 (*)    All other components within normal limits  I-STAT BETA HCG BLOOD, ED ([facility], [facility], [facility] ONLY) - Abnormal; Notable for the following components:   I-stat hCG, quantitative >2,000.0 (*)    All other components within normal limits  WET PREP, GENITAL  LIPASE, BLOOD  GC/CHLAMYDIA PROBE AMP ([place]    EKG None  Radiology No results found.  Procedures Procedures (including critical care time)  Medications Ordered in ED Medications  sodium chloride 0.9 % bolus 1,000 mL (has no administration in time range)  metoCLOPramide (REGLAN) injection 5 mg (has no administration in time range)     Initial Impression / Assessment and Plan /  ED Course  I have reviewed the triage vital signs and the nursing notes.  Pertinent labs & imaging results that were available during my care of the patient were reviewed by me and considered in my medical decision making (see chart for details).     Patient resents to the ED for evaluation of lower abdominal pain that has been intermittent for the past 2 to 3 days.  Also reports nausea and vomiting with a missed period in August.  Patient vital signs reassuring.  No focal abdominal pain to palpation on exam.  Bowel sounds present in all 4 quadrants.  No signs of peritonitis.  Pelvic exam reveals no vaginal discharge or bleeding.  No cervical motion tenderness.  Lab work shows no leukocytosis.  No significant elect light derangement.  Lipase normal.  Hemoglobin at baseline.  UA shows no signs of infection but does show 80 ketones.  Beta hCG is positive.  Wet prep shows clue cells and WBCs but no other acute  abnormalities.  Gonorrhea and Chlamydia cultures are pending.  Patient given fluids and Reglan for vomiting.  Given Tylenol for pain.  Ultrasound was obtained given her pain that shows intrauterine pregnancy at approximately [redacted] weeks gestation.  No signs of ectopic pregnancy.  No subchorionic hemorrhage.  Doubt appendicitis, diverticulitis, UTI, cholecystitis, pancreatitis.  Patient tolerated p.o. fluids after the Reglan.  Will prescribe short course of Reglan for home use.  Encourage prenatal vitamins and OB/GYN follow-up.  Patient does have clue cells on the wet prep but denies any complaints of vaginal discharge.  We will not treat at this time and have close OB/GYN follow-up for this.  Repeat abdominal exam was benign.  Patient sleeping on reexamination.  Pt is hemodynamically stable, in NAD, & able to ambulate in the ED. Evaluation does not show pathology that would require ongoing emergent intervention or inpatient treatment. I explained the diagnosis to the patient. Pain has been managed & has no  complaints prior to dc. Pt is comfortable with above plan and is stable for discharge at this time. All questions were answered prior to disposition. Strict return precautions for f/u to the ED were discussed. Encouraged follow up with PCP.   Final Clinical Impressions(s) / ED Diagnoses   Final diagnoses:  Nausea and vomiting during pregnancy  Less than [redacted] weeks gestation of pregnancy    ED Discharge Orders         Ordered    metoCLOPramide (REGLAN) 5 MG tablet  Every 6 hours     03/07/18 0625           Rise MuLeaphart, Kenneth T, PA-C 03/07/18 0630    Palumbo, April, MD 03/07/18 [id number]

## 2018-03-07 NOTE — Discharge Instructions (Addendum)
Your pregnancy test was positive and your baby is approximately [redacted] weeks along.  Please follow-up with your OB/GYN doctor have given you a referral.  Start taking prenatal vitamins.  For your morning sickness you may try vitamin B6 3 times a day and a Unisom at night to help.  If this does not help you can take the Reglan that I have prescribed for you.  If you develop any vaginal bleeding or worsening abdominal pain return to the ED immediately.  May take Tylenol for pain.  Avoid NSAIDs during pregnancy.

## 2018-05-23 ENCOUNTER — Ambulatory Visit: Payer: Medicaid Other | Admitting: Clinical

## 2018-05-23 ENCOUNTER — Encounter: Payer: Self-pay | Admitting: *Deleted

## 2018-05-23 ENCOUNTER — Other Ambulatory Visit (HOSPITAL_COMMUNITY)
Admission: RE | Admit: 2018-05-23 | Discharge: 2018-05-23 | Disposition: A | Discharge status: Home / Self Care | Payer: Medicaid Other | Source: Ambulatory Visit | Attending: Student | Admitting: Student

## 2018-05-23 ENCOUNTER — Encounter: Payer: Self-pay | Admitting: Student

## 2018-05-23 ENCOUNTER — Ambulatory Visit (INDEPENDENT_AMBULATORY_CARE_PROVIDER_SITE_OTHER): Payer: Medicaid Other | Admitting: Student

## 2018-05-23 DIAGNOSIS — Z3402 Encounter for supervision of normal first pregnancy, second trimester: Secondary | ICD-10-CM

## 2018-05-23 DIAGNOSIS — Z3482 Encounter for supervision of other normal pregnancy, second trimester: Secondary | ICD-10-CM | POA: Diagnosis not present

## 2018-05-23 DIAGNOSIS — Z23 Encounter for immunization: Secondary | ICD-10-CM

## 2018-05-23 DIAGNOSIS — J4520 Mild intermittent asthma, uncomplicated: Secondary | ICD-10-CM | POA: Insufficient documentation

## 2018-05-23 NOTE — Patient Instructions (Addendum)
AREA PEDIATRIC/FAMILY PRACTICE PHYSICIANS  [facility] [address], [address] [place], [place]  [address] Phone - [phone]   Fax - [phone]  [facility] [address] [place], [place] [address] Phone - [phone]   Fax - [phone]  JACK AMOS [address] [place], [place]  [address] Phone - [phone]   Fax - [phone]  [facility] [address] [place], [place]  [address] Phone - [phone]   Fax - [phone]  [facility] [address] [place], [place]  [address] Phone - [phone]   Fax - [phone]  [facility] [address] [place], [place]  [address] Phone - [phone]   Fax - [phone]  [facility] [address] [place], [place]  [address] Phone - [phone]   Fax - [phone]  [facility] [address], [address] [place], [place]  [address] Phone - [phone]   Fax - [phone]  [facility] [address] [place], [place]  [address] Phone - [phone]   Fax - [phone] [facility] [address] [place], [place]  [address] Phone - [phone]   Fax - [phone]  [facility] [address] [place], [place]  [address] Phone - [phone]   Fax - [phone]  [facility] [address] [place], [place]  [address] Phone - [phone]   Fax - [phone]  [facility] [address] [place], [place]  [address] Phone - [phone]   Fax - [phone]  SHILPA GOSRANI [address] [place], [place]  [address] Phone - [phone]  [facility] [address] [place], [place]  [address] Phone - [phone]   Fax - [phone]  [facility] [address] [place], [place]  [address] Phone - [phone]   Fax - [phone]  [facility] [address] [place], [place]  [address] Phone - [phone]   Fax - [phone]  [facility] [address] [place], [place]  [address] Phone - [phone]   Fax - [phone]   [facility] [address] [place], [place]  [address] Phone - [phone]   Fax - [phone]  [facility] [address] , [place]  [address] Phone - [phone]   Fax - [phone]  DAVID RUBIN [address] , [place]  [address] Phone - [phone]   Fax - [phone]  [facility] [address] , [place]  [address] Phone - [phone]   Fax - [phone]  [facility] [address] , [place]  [address] Phone - [phone]   Fax - [phone] [facility] [address] [place], [place]  [address] Phone - [phone]   Fax - [phone]  [facility] [address] [place], [place]  [address] Phone - [phone]   Fax - [phone]  [facility] [address] [place],   [address] Phone - [phone]   Fax - [phone]  [facility] Charlene Flemming MD [address] [place]  [address] Phone [phone]  Fax [phone]  Childbirth Education Options: [facility] Classes:  Childbirth education classes can help you   get ready for a positive parenting experience. You can also meet other expectant parents and get free stuff for your baby. Each class runs for five weeks on the same night and costs $45 for the mother-to-be and her support person. Medicaid covers the cost if you are eligible. Call [phone] to register. [facility] Childbirth Education:  [phone] or 5174845873 or  [email or web address]  Baby & Me Class: Discuss newborn & infant parenting and family adjustment issues with other new mothers in a relaxed environment. Each week brings a new speaker or baby-centered activity. We encourage new mothers to join [place] every Thursday at 11:00am. Babies birth until crawling. No registration or fee. Daddy [employer]: This course offers Dads-to-be the tools and knowledge needed to feel confident on their journey to becoming new fathers. Experienced dads, who have been trained as coaches, teach dads-to-be how to hold, comfort, diaper, swaddle and play with their infant while being able to support the new mom as well. A class for men taught by men. $25/dad Big Brother/Big Sister: Let your children share in the joy of a new brother or sister in this special class designed just for them. Class includes discussion about how families care for babies: swaddling, holding, diapering, safety as well as how they can be helpful in their new role. This class is designed for children ages 26 to 31, but any age is welcome. Please register each child individually. $5/child  Mom Talk: This mom-led group offers support and connection to mothers as they journey through the adjustments and struggles of that sometimes overwhelming first year after the birth of a child. Tuesdays at 10:00am and Thursdays at 6:00pm. Babies welcome. No registration or fee. Breastfeeding Support Group: This group is a mother-to-mother support circle where moms have the opportunity to share their breastfeeding experiences. A Lactation Consultant is present for questions and concerns. Meets each Tuesday at 11:00am. No fee or registration. Breastfeeding Your Baby: Learn what to expect in the first days of breastfeeding your newborn.  This class will help you feel more confident with the skills needed to begin your breastfeeding experience. Many new mothers are concerned about breastfeeding after leaving the hospital. This class  will also address the most common fears and challenges about breastfeeding during the first few weeks, months and beyond. (call for fee) Comfort Techniques and Tour: This 2 hour interactive class will provide you the opportunity to learn & practice hands-on techniques that can help relieve some of the discomfort of labor and encourage your baby to rotate toward the best position for birth. You and your partner will be able to try a variety of labor positions with birth balls and rebozos as well as practice breathing, relaxation, and visualization techniques. A tour of the [facility] is included with this class. $20 per registrant and support person Childbirth Class- Weekend Option: This class is a Weekend version of our Birth & Baby series. It is designed for parents who have a difficult time fitting several weeks of classes into their schedule. It covers the care of your newborn and the basics of labor and childbirth. It also includes a [place] [facility] and lunch. The class is held two consecutive days: beginning on Friday evening from 6:30 - 8:30 p.m. and the next day, Saturday from 9 a.m. - 4 p.m. (call for fee) Doren Custard Class: Interested in a waterbirth?  This informational class will help you discover whether waterbirth is the right fit for you.  Education about waterbirth itself, supplies you would need and how to assemble your support team is what you can expect from this class. Some obstetrical practices require this class in order to pursue a waterbirth. (Not all obstetrical practices offer waterbirth-check with your healthcare provider.) Register only the expectant mom, but you are encouraged to bring your partner to class! Required if planning waterbirth, no fee. Infant/Child CPR: Parents, grandparents, babysitters, and friends learn Cardio-Pulmonary Resuscitation skills for infants and children. You will also learn how to treat both conscious  and unconscious choking in infants and children. This Family & Friends program does not offer certification. Register each participant individually to ensure that enough mannequins are available. (Call for fee) Grandparent Love: Expecting a grandbaby? This class is for you! Learn about the latest infant care and safety recommendations and ways to support your own child as he or she transitions into the parenting role. Taught by Registered Nurses who are childbirth instructors, but most importantly...they are grandmothers too! $10/person. Childbirth Class- Natural Childbirth: This series of 5 weekly classes is for expectant parents who want to learn and practice natural methods of coping with the process of labor and childbirth. Relaxation, breathing, massage, visualization, role of the partner, and helpful positioning are highlighted. Participants learn how to be confident in their body's ability to give birth. This class will empower and help parents make informed decisions about their own care. Includes discussion that will help new parents transition into the immediate postpartum period. [facility] is included. We suggest taking this class between 25-32 weeks, but it's only a recommendation. $75 per registrant and one support person or $30 Medicaid. Childbirth Class- 3 week Series: This option of 3 weekly classes helps you and your labor partner prepare for childbirth. Newborn care, labor & birth, cesarean birth, pain management, and comfort techniques are discussed and a [place] [facility] is included. The class meets at the same time, on the same day of the week for 3 consecutive weeks beginning with the starting date you choose. $60 for registrant and one support person.  Marvelous Multiples: Expecting twins, triplets, or more? This class covers the differences in labor, birth, parenting, and breastfeeding issues that face multiples' parents.  NICU tour is included. Led by a Certified Childbirth Educator who is the mother of twins. No fee. Caring for Baby: This class is for expectant and adoptive parents who want to learn and practice the most up-to-date newborn care for their babies. Focus is on birth through the first six weeks of life. Topics include feeding, bathing, diapering, crying, umbilical cord care, circumcision care and safe sleep. Parents learn to recognize symptoms of illness and when to call the pediatrician. Register only the mom-to-be and your partner or support person can plan to come with you! $10 per registrant and support person Childbirth Class- online option: This online class offers you the freedom to complete a Birth and Baby series in the comfort of your own home. The flexibility of this option allows you to review sections at your own pace, at times convenient to you and your support people. It includes additional video information, animations, quizzes, and extended activities. Get organized with helpful eClass tools, checklists, and trackers. Once you register online for the class, you will receive an email within a few days to accept the invitation and begin the class when the time is right for you. The content will be available to you for 60 days. $  60 for 60 days of online access for you and your support people.  Local Doulas: Natural Baby Doulas [email or web address] Tel: [phone] [email or web address] [facility] [phone] [email or web address] The Labor Ladies  (also do waterbirth tub rental) [phone] [email or web address] [email or web address] [phone] [email or web address] Sacred Rhythms  [phone] [email or web address] [place] [facility] [employer] (PADA) [email or web address] http://[email or web address].org/index.htm [place] [facility]   [email or web address] Considering Waterbirth? Guide for patients at [facility]  Why consider waterbirth?  . Gentle birth for babies . Less pain medicine used in labor . May allow for passive descent/less pushing . May reduce perineal tears  . More mobility and instinctive maternal position changes . Increased maternal relaxation . Reduced blood pressure in labor  Is waterbirth safe? What are the risks of infection, drowning or other complications?  . Infection: o Very low risk (3.7 % for tub vs 4.8% for bed) o 7 in 8000 waterbirths with documented infection o Poorly cleaned equipment most common cause o Slightly lower group B strep transmission rate  . Drowning o Maternal:  - Very low risk   - Related to seizures or fainting o Newborn:  - Very low risk. No evidence of increased risk of respiratory problems in multiple large studies - Physiological protection from breathing under water - Avoid underwater birth if there are any fetal complications - Once baby's head is out of the water, keep it out.  . Birth complication o Some reports of cord trauma, but risk decreased by bringing baby to surface gradually o No evidence of increased risk of shoulder dystocia. Mothers can usually change positions faster in water than in a bed, possibly aiding the maneuvers to free the shoulder.   You must attend a Waterbirth class at [facility]  3rd Wednesday of every month from 7-9pm  Free  Register by calling [phone] or online at [email or web address]  Bring us the certificate from the class to your prenatal appointment  Meet with a midwife at 36 weeks to see if you can still plan a waterbirth and to sign the consent.   Purchase or rent the following supplies:   Water Birth Pool (Birth Pool in a Box or [place] for instance)  (Tubs start ~$125)  Single-use disposable tub liner designed for your brand of tub  New garden hose labeled  "lead-free", "suitable for drinking water",  Electric drain pump to remove water (We recommend 792 gallon per hour or greater pump.)   Separate garden hose to remove the dirty water  Fish net  Bathing suit top (optional)  Long-handled mirror (optional)  Places to purchase or rent supplies  [email or web address] for tub purchases and supplies  Waterbirthsolutions.com for tub purchases and supplies  The Labor Ladies ([email or web address]) $275 for tub rental/set-up & take down/kit   [facility] (http://www.padanc.org/MeetUs.htm) Information regarding doulas (labor support) who provide pool rentals  Our practice has a Birth Pool in a Box tub at the hospital that you may borrow on a first-come-first-served basis. It is your responsibility to to set up, clean and break down the tub. We cannot guarantee the availability of this tub in advance. You are responsible for bringing all accessories listed above. If you do not have all necessary supplies you cannot have a waterbirth.    Things that would prevent you from having a waterbirth:  Premature, <37wks  Previous cesarean birth  Presence of thick meconium-stained fluid  Multiple gestation (Twins, triplets, etc.)  Uncontrolled diabetes or gestational diabetes requiring medication  Hypertension requiring medication   or diagnosis of pre-eclampsia  Heavy vaginal bleeding  Non-reassuring fetal heart rate  Active infection (MRSA, etc.). Group B Strep is NOT a contraindication for  waterbirth.  If your labor has to be induced and induction method requires continuous  monitoring of the baby's heart rate  Other risks/issues identified by your obstetrical provider  Please remember that birth is unpredictable. Under certain unforeseeable circumstances your provider may advise against giving birth in the tub. These decisions will be made on a case-by-case basis and with the safety of you and your baby as our highest  priority      How a Baby Grows During Pregnancy Pregnancy begins when a female's sperm enters a female's egg (fertilization). This happens in one of the tubes (fallopian tubes) that connect the ovaries to the womb (uterus). The fertilized egg is called an embryo until it reaches 10 weeks. From 10 weeks until birth, it is called a fetus. The fertilized egg moves down the fallopian tube to the uterus. Then it implants into the lining of the uterus and begins to grow. The developing fetus receives oxygen and nutrients through the pregnant woman's bloodstream and the tissues that grow (placenta) to support the fetus. The placenta is the life support system for the fetus. It provides nutrition and removes waste. Learning as much as you can about your pregnancy and how your baby is developing can help you enjoy the experience. It can also make you aware of when there might be a problem and when to ask questions. How long does a typical pregnancy last? A pregnancy usually lasts 280 days, or about 40 weeks. Pregnancy is divided into three trimesters:  First trimester: 0-13 weeks.  Second trimester: 14-27 weeks.  Third trimester: 28-40 weeks.  The day when your baby is considered ready to be born (full term) is your estimated date of delivery. How does my baby develop month by month? First month  The fertilized egg attaches to the inside of the uterus.  Some cells will form the placenta. Others will form the fetus.  The arms, legs, brain, spinal cord, lungs, and heart begin to develop.  At the end of the first month, the heart begins to beat.  Second month  The bones, inner ear, eyelids, hands, and feet form.  The genitals develop.  By the end of 8 weeks, all major organs are developing.  Third month  All of the internal organs are forming.  Teeth develop below the gums.  Bones and muscles begin to grow. The spine can flex.  The skin is transparent.  Fingernails and toenails  begin to form.  Arms and legs continue to grow longer, and hands and feet develop.  The fetus is about 3 in (7.6 cm) long.  Fourth month  The placenta is completely formed.  The external sex organs, neck, outer ear, eyebrows, eyelids, and fingernails are formed.  The fetus can hear, swallow, and move its arms and legs.  The kidneys begin to produce urine.  The skin is covered with a white waxy coating (vernix) and very fine hair (lanugo).  Fifth month  The fetus moves around more and can be felt for the first time (quickening).  The fetus starts to sleep and wake up and may begin to suck its finger.  The nails grow to the end of the fingers.  The organ in the digestive system that makes bile (gallbladder) functions and helps to digest the nutrients.  If your baby is a girl, eggs  are present in her ovaries. If your baby is a boy, testicles start to move down into his scrotum.  Sixth month  The lungs are formed, but the fetus is not yet able to breathe.  The eyes open. The brain continues to develop.  Your baby has fingerprints and toe prints. Your baby's hair grows thicker.  At the end of the second trimester, the fetus is about 9 in (22.9 cm) long.  Seventh month  The fetus kicks and stretches.  The eyes are developed enough to sense changes in light.  The hands can make a grasping motion.  The fetus responds to sound.  Eighth month  All organs and body systems are fully developed and functioning.  Bones harden and taste buds develop. The fetus may hiccup.  Certain areas of the brain are still developing. The skull remains soft.  Ninth month  The fetus gains about  lb (0.23 kg) each week.  The lungs are fully developed.  Patterns of sleep develop.  The fetus's head typically moves into a head-down position (vertex) in the uterus to prepare for birth. If the buttocks move into a vertex position instead, the baby is breech.  The fetus weighs 6-9 lbs  (2.72-4.08 kg) and is 19-20 in (48.26-50.8 cm) long.  What can I do to have a healthy pregnancy and help my baby develop? Eating and Drinking  Eat a healthy diet. ? Talk with your health care provider to make sure that you are getting the nutrients that you and your baby need. ? Visit www.BuildDNA.es to learn about creating a healthy diet.  Gain a healthy amount of weight during pregnancy as advised by your health care provider. This is usually 25-35 pounds. You may need to: ? Gain more if you were underweight before getting pregnant or if you are pregnant with more than one baby. ? Gain less if you were overweight or obese when you got pregnant.  Medicines and Vitamins  Take prenatal vitamins as directed by your health care provider. These include vitamins such as folic acid, iron, calcium, and vitamin D. They are important for healthy development.  Take medicines only as directed by your health care provider. Read labels and ask a pharmacist or your health care provider whether over-the-counter medicines, supplements, and prescription drugs are safe to take during pregnancy.  Activities  Be physically active as advised by your health care provider. Ask your health care provider to recommend activities that are safe for you to do, such as walking or swimming.  Do not participate in strenuous or extreme sports.  Lifestyle  Do not drink alcohol.  Do not use any tobacco products, including cigarettes, chewing tobacco, or electronic cigarettes. If you need help quitting, ask your health care provider.  Do not use illegal drugs.  Safety  Avoid exposure to mercury, lead, or other heavy metals. Ask your health care provider about common sources of these heavy metals.  Avoid listeria infection during pregnancy. Follow these precautions: ? Do not eat soft cheeses or deli meats. ? Do not eat hot dogs unless they have been warmed up to the point of steaming, such as in the  microwave oven. ? Do not drink unpasteurized milk.  Avoid toxoplasmosis infection during pregnancy. Follow these precautions: ? Do not change your cat's litter box, if you have a cat. Ask someone else to do this for you. ? Wear gardening gloves while working in the yard.  General Instructions  Keep all follow-up visits as directed  by your health care provider. This is important. This includes prenatal care and screening tests.  Manage any chronic health conditions. Work closely with your health care provider to keep conditions, such as diabetes, under control.  How do I know if my baby is developing well? At each prenatal visit, your health care provider will do several different tests to check on your health and keep track of your baby's development. These include:  Fundal height. ? Your health care provider will measure your growing belly from top to bottom using a tape measure. ? Your health care provider will also feel your belly to determine your baby's position.  Heartbeat. ? An ultrasound in the first trimester can confirm pregnancy and show a heartbeat, depending on how far along you are. ? Your health care provider will check your baby's heart rate at every prenatal visit. ? As you get closer to your delivery date, you may have regular fetal heart rate monitoring to make sure that your baby is not in distress.  Second trimester ultrasound. ? This ultrasound checks your baby's development. It also indicates your baby's gender.  What should I do if I have concerns about my baby's development? Always talk with your health care provider about any concerns that you may have. This information is not intended to replace advice given to you by your health care provider. Make sure you discuss any questions you have with your health care provider. Document Released: 12/26/2007 Document Revised: 12/15/2015 Document Reviewed: 12/16/2013 Elsevier Interactive Patient Education  2018  [place] Medications in Pregnancy   Acne: Benzoyl Peroxide Salicylic Acid  Backache/Headache: Tylenol: 2 regular strength every 4 hours OR              2 Extra strength every 6 hours  Colds/Coughs/Allergies: Benadryl (alcohol free) 25 mg every 6 hours as needed Breath right strips Claritin Cepacol throat lozenges Chloraseptic throat spray Cold-Eeze- up to three times per day Cough drops, alcohol free Flonase (by prescription only) Guaifenesin Mucinex Robitussin DM (plain only, alcohol free) Saline nasal spray/drops Sudafed (pseudoephedrine) & Actifed ** use only after [redacted] weeks gestation and if you do not have high blood pressure Tylenol Vicks Vaporub Zinc lozenges Zyrtec   Constipation: Colace Ducolax suppositories Fleet enema Glycerin suppositories Metamucil Milk of magnesia Miralax Senokot Smooth move tea  Diarrhea: Kaopectate Imodium A-D  *NO pepto Bismol  Hemorrhoids: Anusol Anusol HC Preparation H Tucks  Indigestion: Tums Maalox Mylanta Zantac  Pepcid  Insomnia: Benadryl (alcohol free) '25mg'$  every 6 hours as needed Tylenol PM Unisom, no Gelcaps  Leg Cramps: Tums MagGel  Nausea/Vomiting:  Bonine Dramamine Emetrol Ginger extract Sea bands Meclizine  Nausea medication to take during pregnancy:  Unisom (doxylamine succinate 25 mg tablets) Take one tablet daily at bedtime. If symptoms are not adequately controlled, the dose can be increased to a maximum recommended dose of two tablets daily (1/2 tablet in the morning, 1/2 tablet mid-afternoon and one at bedtime). Vitamin B6 '100mg'$  tablets. Take one tablet twice a day (up to 200 mg per day).  Skin Rashes: Aveeno products Benadryl cream or '25mg'$  every 6 hours as needed Calamine Lotion 1% cortisone cream  Yeast infection: Gyne-lotrimin 7 Monistat 7  Gum/tooth pain: Anbesol  **If taking multiple medications, please check labels to avoid duplicating the same  active ingredients **take medication as directed on the label ** Do not exceed 4000 mg of tylenol in 24 hours **Do not take medications that contain  aspirin or ibuprofen

## 2018-05-23 NOTE — Progress Notes (Signed)
Subjective:   Natasha Martinez is a 22 y.o. G1P0 at [redacted]w[redacted]d by LMP (c/w 6 wk ultrasound) being seen today for her first obstetrical visit.  Her obstetrical history is significant for none. Patient does intend to breast feed. Pregnancy history fully reviewed. Hx of seizure; last seizure was when she was 22 years old & is on no medications for it. Hx of asthma; last used albuterol inhaler a few years ago.  Lives with her aunt who has cats & dogs. She does not change the litter box.  She works at a daycare.   Patient reports no complaints.  HISTORY: OB History  Gravida Para Term Preterm AB Living  1 0 0 0 0 0  SAB TAB Ectopic Multiple Live Births  0 0 0 0 0    # Outcome Date GA Lbr Len/2nd Weight Sex Delivery Anes PTL Lv  1 Current            Past Medical History:  Diagnosis Date  . Asthma    hasn't used inhaler in years  . Migraine   . Seizures (HCC)    as a child, last time when 66 yrs old   Past Surgical History:  Procedure Laterality Date  . Wisdom tooth removal     History reviewed. No pertinent family history. Social History   Tobacco Use  . Smoking status: Former Smoker    Last attempt to quit: 01/2018    Years since quitting: 0.3  . Smokeless tobacco: Never Used  Substance Use Topics  . Alcohol use: No  . Drug use: No   No Known Allergies Current Outpatient Medications on File Prior to Visit  Medication Sig Dispense Refill  . albuterol (PROVENTIL HFA;VENTOLIN HFA) 108 (90 BASE) MCG/ACT inhaler Inhale 2 puffs into the lungs 4 (four) times daily. (Patient taking differently: Inhale 2 puffs into the lungs every 6 (six) hours as needed for wheezing or shortness of breath. ) 1 Inhaler 0   No current facility-administered medications on file prior to visit.     Exam   Vitals:   05/23/18 0851  BP: 113/63  Pulse: 95  Weight: 127 lb 8 oz (57.8 kg)   Fetal Heart Rate (bpm): 153  Uterus:   Enlarged S=D  Pelvic Exam: Perineum: no hemorrhoids, normal perineum   Vulva: normal external genitalia, no lesions   Vagina:  normal mucosa, normal discharge   Cervix: no lesions and normal, pap smear done.    Adnexa: normal adnexa and no mass, fullness, tenderness   Bony Pelvis: average  System: General: well-developed, well-nourished female in no acute distress   Breast:  Not examined   Skin: normal coloration and turgor, no rashes   Neurologic: oriented, normal, negative, normal mood   HEENT PERRLA, extraocular movement intact and sclera clear, anicteric   Mouth/Teeth mucous membranes moist, pharynx normal without lesions and dental hygiene good   Neck supple and no masses   Cardiovascular: regular rate and rhythm   Respiratory:  no respiratory distress, normal breath sounds   Abdomen: soft, non-tender; bowel sounds normal; no masses,  no organomegaly     Assessment:   Pregnancy: G1P0 Patient Active Problem List   Diagnosis Date Noted  . Encounter for supervision of normal first pregnancy in second trimester 05/23/2018  . Asthma in adult, mild intermittent, uncomplicated 05/23/2018  . Chronic migraine w/o aura w/o status migrainosus, not intractable 08/04/2015  . History of seizures as a child 08/04/2015     Plan:  1. Encounter for supervision of normal first pregnancy in second trimester  - Culture, OB Urine - Cystic fibrosis gene test - Cytology - PAP - Genetic Screening - Hemoglobinopathy Evaluation - Obstetric Panel, Including HIV - SMN1 COPY NUMBER ANALYSIS (SMA Carrier Screen) - [place] MFM OB COMP + 14 WK; Future - Flu Vaccine QUAD 36+ mos IM - AFP, Serum, Open Spina Bifida   Initial labs drawn. Continue prenatal vitamins. Genetic Screening discussed, AFP and NIPS: ordered. Ultrasound discussed; fetal anatomic survey: ordered. Problem list reviewed and updated. The nature of  - [facility] with multiple MDs and other Advanced Practice Providers was explained to patient; also emphasized that  residents, students are part of our team. Routine obstetric precautions reviewed. Schedule f/u appointment in 4 weeks.    Judeth Horn 12:29 PM 05/23/18

## 2018-05-23 NOTE — BH Specialist Note (Signed)
Integrated Behavioral Health Initial Visit  MRN: [id number] Name: Natasha Martinez  Number of Integrated Behavioral Health Clinician visits:: 1/6 Session Start time: 9:50  Session End time: 10:00 Total time: 15 minutes  Type of Service: Integrated Behavioral Health- Individual/Family Interpretor:No. Interpretor Name and Language: n/a   Warm Hand Off Completed.       SUBJECTIVE: Natasha Martinez is a 22 y.o. female accompanied by n/a Patient was referred by Judeth Horn, NP for Initial OB introduction to integrated behavioral health services . Patient reports the following symptoms/concerns: Pt states no particular concerns today.  Duration of problem: n/a; Severity of problem: n/a  OBJECTIVE: Mood: Normal and Affect: Appropriate Risk of harm to self or others: No plan to harm self or others  LIFE CONTEXT: Family and Social: - School/Work: - Self-Care: - Life Changes: Current pregnancy   GOALS ADDRESSED: 1. Increase knowledge and/or ability of: healthy habits  INTERVENTIONS: Standardized Assessments completed: GAD-7 and PHQ 9  ASSESSMENT: Patient currently experiencing Supervision of normal first pregnancy, in second trimester.   Patient may benefit from Initial OB introduction to integrated behavioral health services .  PLAN: 1. Follow up with behavioral health clinician on : As needed 2. Behavioral recommendations:  -Continue taking prenatal vitamin, as recommended by medical provider 3. Referral(s): Integrated [employer] (In Clinic) 4. "From scale of 1-10, how likely are you to follow plan?": 10  Rae Lips, LCSW  Depression screen [facility] 2/9 05/23/2018  Decreased Interest 0  Down, Depressed, Hopeless 0  PHQ - 2 Score 0  Altered sleeping 1  Tired, decreased energy 2  Change in appetite 1  Feeling bad or failure about yourself  0  Trouble concentrating 0  Moving slowly or fidgety/restless 0  Suicidal thoughts 0  PHQ-9 Score 4   GAD 7 :  Generalized Anxiety Score 05/23/2018  Nervous, Anxious, on Edge 1  Control/stop worrying 0  Worry too much - different things 0  Trouble relaxing 1  Restless 0  Easily annoyed or irritable 0  Afraid - awful might happen 0  Total GAD 7 Score 2

## 2018-05-23 NOTE — Assessment & Plan Note (Addendum)
  Nursing Staff Provider  Office Location  [facility] Dating   LMP c/w 6 wk u/s  Language  English Anatomy US    Flu Vaccine  05/23/18 Genetic Screen  NIPS:   AFP:       TDaP vaccine    Hgb A1C or  GTT  Third trimester   Rhogam     LAB RESULTS   Feeding Plan Breast Blood Type     Contraception Depo Antibody    Circumcision Yes-if female [place]    Pediatrician  List Given RPR     Support Person Craig (FOB) HBsAg     Prenatal Classes List Given HIV    BTL Consent n/a GBS    VBAC Consent n/a Pap  05/23/18    Hgb Electro      CF     SMA     Waterbirth  [ ]  Class [ ]  Consent [ ]  CNM visit

## 2018-05-25 LAB — CULTURE, OB URINE

## 2018-05-25 LAB — URINE CULTURE, OB REFLEX: ORGANISM ID, BACTERIA: NO GROWTH

## 2018-05-26 ENCOUNTER — Other Ambulatory Visit: Payer: Self-pay | Admitting: Student

## 2018-05-26 DIAGNOSIS — B373 Candidiasis of vulva and vagina: Secondary | ICD-10-CM

## 2018-05-26 DIAGNOSIS — B3731 Acute candidiasis of vulva and vagina: Secondary | ICD-10-CM

## 2018-05-26 LAB — CYTOLOGY - PAP
CHLAMYDIA, DNA PROBE: NEGATIVE
Diagnosis: NEGATIVE
Neisseria Gonorrhea: NEGATIVE

## 2018-05-26 MED ORDER — TERCONAZOLE 0.8 % VA CREA: 1.0000 | TOPICAL_CREAM | Freq: Every day | VAGINAL | 0 refills | Status: DC

## 2018-05-27 ENCOUNTER — Encounter (HOSPITAL_COMMUNITY): Payer: Self-pay

## 2018-05-27 LAB — AFP, SERUM, OPEN SPINA BIFIDA
AFP MoM: 1.09
AFP VALUE AFPOSL: 54.7 ng/mL
Gest. Age on Collection Date: 17.6 weeks
MATERNAL AGE AT EDD: 23.4 a
OSBR Risk 1 IN: 10000
Test Results:: NEGATIVE
Weight: 128 [lb_av]

## 2018-05-29 ENCOUNTER — Encounter: Payer: Self-pay | Admitting: *Deleted

## 2018-06-02 LAB — HEMOGLOBINOPATHY EVALUATION
Ferritin: 30 ng/mL (ref 15–150)
HGB A: 97.2 % (ref 96.4–98.8)
HGB F QUANT: 0 % (ref 0.0–2.0)
HGB SOLUBILITY: NEGATIVE
Hgb A2 Quant: 2.8 % (ref 1.8–3.2)
Hgb C: 0 %
Hgb S: 0 %
Hgb Variant: 0 %

## 2018-06-02 LAB — OBSTETRIC PANEL, INCLUDING HIV
ANTIBODY SCREEN: NEGATIVE
BASOS: 0 %
Basophils Absolute: 0 10*3/uL (ref 0.0–0.2)
EOS (ABSOLUTE): 0.1 10*3/uL (ref 0.0–0.4)
EOS: 1 %
HEMATOCRIT: 32.2 % — AB (ref 34.0–46.6)
HEMOGLOBIN: 10.4 g/dL — AB (ref 11.1–15.9)
HEP B S AG: NEGATIVE
HIV SCREEN 4TH GENERATION: NONREACTIVE
IMMATURE GRANULOCYTES: 0 %
Immature Grans (Abs): 0 10*3/uL (ref 0.0–0.1)
LYMPHS ABS: 1.7 10*3/uL (ref 0.7–3.1)
Lymphs: 30 %
MCH: 27.9 pg (ref 26.6–33.0)
MCHC: 32.3 g/dL (ref 31.5–35.7)
MCV: 86 fL (ref 79–97)
MONOS ABS: 0.6 10*3/uL (ref 0.1–0.9)
Monocytes: 10 %
NEUTROS ABS: 3.4 10*3/uL (ref 1.4–7.0)
Neutrophils: 59 %
Platelets: 287 10*3/uL (ref 150–450)
RBC: 3.73 x10E6/uL — ABNORMAL LOW (ref 3.77–5.28)
RDW: 13.1 % (ref 12.3–15.4)
RH TYPE: POSITIVE
RPR: NONREACTIVE
Rubella Antibodies, IGG: 3.86 index (ref >0.99)
WBC: 5.8 10*3/uL (ref 3.4–10.8)

## 2018-06-02 LAB — SMN1 COPY NUMBER ANALYSIS (SMA CARRIER SCREENING)

## 2018-06-02 LAB — CYSTIC FIBROSIS GENE TEST

## 2018-06-03 ENCOUNTER — Ambulatory Visit (HOSPITAL_COMMUNITY)
Admission: RE | Admit: 2018-06-03 | Discharge: 2018-06-03 | Disposition: A | Discharge status: Home / Self Care | Payer: Medicaid Other | Source: Ambulatory Visit | Attending: Student | Admitting: Student

## 2018-06-03 DIAGNOSIS — O99352 Diseases of the nervous system complicating pregnancy, second trimester: Secondary | ICD-10-CM | POA: Diagnosis not present

## 2018-06-03 DIAGNOSIS — O99512 Diseases of the respiratory system complicating pregnancy, second trimester: Secondary | ICD-10-CM | POA: Diagnosis not present

## 2018-06-03 DIAGNOSIS — Z363 Encounter for antenatal screening for malformations: Secondary | ICD-10-CM | POA: Diagnosis not present

## 2018-06-03 DIAGNOSIS — G40909 Epilepsy, unspecified, not intractable, without status epilepticus: Secondary | ICD-10-CM | POA: Insufficient documentation

## 2018-06-03 DIAGNOSIS — Z3A19 19 weeks gestation of pregnancy: Secondary | ICD-10-CM

## 2018-06-03 DIAGNOSIS — Z3402 Encounter for supervision of normal first pregnancy, second trimester: Secondary | ICD-10-CM

## 2018-06-03 DIAGNOSIS — J45909 Unspecified asthma, uncomplicated: Secondary | ICD-10-CM | POA: Diagnosis not present

## 2018-06-09 ENCOUNTER — Encounter: Payer: Self-pay | Admitting: *Deleted

## 2018-06-23 ENCOUNTER — Encounter: Payer: Self-pay | Admitting: Advanced Practice Midwife

## 2018-06-23 ENCOUNTER — Ambulatory Visit (INDEPENDENT_AMBULATORY_CARE_PROVIDER_SITE_OTHER): Payer: Medicaid Other | Admitting: Advanced Practice Midwife

## 2018-06-23 DIAGNOSIS — Z3402 Encounter for supervision of normal first pregnancy, second trimester: Secondary | ICD-10-CM

## 2018-06-23 NOTE — Progress Notes (Signed)
   PRENATAL VISIT NOTE  Subjective:  Natasha Martinez is a 22 y.o. G1P0 at 6178w0d being seen today for ongoing prenatal care.  She is currently monitored for the following issues for this low-risk pregnancy and has Chronic migraine w/o aura w/o status migrainosus, not intractable; History of seizures as a child; Encounter for supervision of normal first pregnancy in second trimester; and Asthma in adult, mild intermittent, uncomplicated on their problem list.  Patient reports no complaints.  Contractions: Not present. Vag. Bleeding: None.  Movement: Present. Denies leaking of fluid.   The following portions of the patient's history were reviewed and updated as appropriate: allergies, current medications, past family history, past medical history, past social history, past surgical history and problem list. Problem list updated.  Objective:   Vitals:   06/23/18 0900  BP: (!) 100/59  Pulse: 89  Weight: 135 lb 12.8 oz (61.6 kg)    Fetal Status: Fetal Heart Rate (bpm): 148 Fundal Height: 22 cm Movement: Present     General:  Alert, oriented and cooperative. Patient is in no acute distress.  Skin: Skin is warm and dry. No rash noted.   Cardiovascular: Normal heart rate noted  Respiratory: Normal respiratory effort, no problems with respiration noted  Abdomen: Soft, gravid, appropriate for gestational age.  Pain/Pressure: Absent     Pelvic: Cervical exam deferred        Extremities: Normal range of motion.  Edema: None  Mental Status: Normal mood and affect. Normal behavior. Normal judgment and thought content.   Assessment and Plan:  Pregnancy: G1P0 at 6778w0d  1. Encounter for supervision of normal first pregnancy in second trimester - Routine care - CBC; Future - HIV Antibody (routine testing w rflx); Future - RPR; Future - Glucose Tolerance, 2 Hours w/1 Hour; Future  Preterm labor symptoms and general obstetric precautions including but not limited to vaginal bleeding, contractions,  leaking of fluid and fetal movement were reviewed in detail with the patient. Please refer to After Visit Summary for other counseling recommendations.  Return in about 4 weeks (around 07/21/2018) for 2 hour GTT and 28 week labs at next visit .  No future appointments.  Thressa ShellerHeather Pratt Bress, CNM

## 2018-06-23 NOTE — Patient Instructions (Addendum)
AREA PEDIATRIC/FAMILY [place] [address] [place], [place]  [address] Phone - [phone]   Fax - [phone]  [facility] [address] [place], [place] [address] Phone - [phone]   Fax - [place] [address] [place], [place]  [address] Phone - [phone]   Fax - [phone]  [place] Constantine. [address], [place] [address] [place], [place]  [address] Phone - [phone]   Fax - [phone]  [place] [address] [place], [place]  [address] Phone - [phone]   Fax - [phone]  [facility] [address] [place], [place]  [address] Phone - [phone]   Fax - [place] [address], [place], [place]  [address] Phone - [phone]   Fax - [phone]  [place] [address] [place], [place] [address] [place], [place]  [address] Phone - [phone]   Fax - [place] [address] [place], [place]  [address] Phone - [phone]   Fax - [phone] [facility] [place]. [address] [place], [place]  [address] Phone - [phone]   Fax - [phone]  [place] [address] [place], [place]  [address] Phone - [phone]   Fax - [phone]  [facility] [place], [place], [place]  [address] Phone - [phone]   Fax - [place] [address] [place], [place]  [address] Phone - [phone]   Fax - [phone]  [facility] [address], [place], [place]  [address] Phone - [place], [place]  [address] Phone - [phone]   Fax - [place] [address] [place], [place]  [address] Phone - [phone]   Fax - [phone]  [place] [address] [place], [place]  [address] Phone - [phone]   Fax - [place]. [place], [place]  [address] Phone - [phone]   Fax - [place], [place], [place]  [address] Phone - [phone]   Fax - [place] [address] [place], [place]  [address] Phone - [phone]   Fax - [phone]  Natasha Martinez [address], [place], [place]  [address] Phone - [phone]   Fax - [place] [address] [place], [place]  [address] Phone - [phone]   Fax - [phone]  [place] [address] [place], [place]  [address] Phone - [phone]   Fax - [phone] Natasha Martinez [address] [place], [place]  [address] Phone - [phone]   Fax - [place] [address] [place] [address] [place], [place]  [address] Phone - [phone]   Fax - [place] [address], [place], [place]  [address] Phone - [phone]   Fax - [phone]  [place] [address] [place] [address] Phone [phone]  Fax [phone]  Safe Medications in Pregnancy   Acne: Benzoyl Peroxide Salicylic Acid  Backache/Headache: Tylenol: 2  regular strength every 4 hours OR              2 Extra strength every 6 hours  Colds/Coughs/Allergies: Benadryl (alcohol free) 25 mg every 6 hours as needed Breath right strips Claritin Cepacol throat lozenges Chloraseptic throat spray Cold-Eeze- up to three times per day Cough drops, alcohol free Flonase (by prescription only) Guaifenesin Mucinex Robitussin DM (plain only,  alcohol free) Saline nasal spray/drops Sudafed (pseudoephedrine) & Actifed ** use only after [redacted] weeks gestation and if you do not have high blood pressure Tylenol Vicks Vaporub Zinc lozenges Zyrtec   Constipation: Colace Ducolax suppositories Fleet enema Glycerin suppositories Metamucil Milk of magnesia Miralax Senokot Smooth move tea  Diarrhea: Kaopectate Imodium A-D  *NO pepto Bismol  Hemorrhoids: Anusol Anusol HC Preparation H Tucks  Indigestion: Tums Maalox Mylanta Zantac  Pepcid  Insomnia: Benadryl (alcohol free) 13m every 6 hours as needed Tylenol PM Unisom, no Gelcaps  Leg Cramps: Tums MagGel  Nausea/Vomiting:  Bonine Dramamine Emetrol Ginger extract Sea bands Meclizine  Nausea medication to take during pregnancy:  Unisom (doxylamine succinate 25 mg tablets) Take one tablet daily at bedtime. If symptoms are not adequately controlled, the dose can be increased to a maximum recommended dose of two tablets daily (1/2 tablet in the morning, 1/2 tablet mid-afternoon and one at bedtime). Vitamin B6 1080mtablets. Take one tablet twice a day (up to 200 mg per day).  Skin Rashes: Aveeno products Benadryl cream or 254mvery 6 hours as needed Calamine Lotion 1% cortisone cream  Yeast infection: Gyne-lotrimin 7 Monistat 7   **If taking multiple medications, please check labels to avoid duplicating the same active ingredients **take medication as directed on the label ** Do not exceed 4000 mg of tylenol in 24 hours **Do not take medications that contain aspirin or ibuprofen   Childbirth Education Options: [facility] Classes:  Childbirth education classes can help you get ready for a positive parenting experience. You can also meet other expectant parents and get free stuff for your baby. Each class runs for five weeks on the same night and costs $45 for the mother-to-be and her support person. Medicaid covers the cost if  you are eligible. Call [phone] register. [facility] Education:  [phone] [email or web address]  Baby & Me Class: Discuss newborn & infant parenting and family adjustment issues with other new mothers in a relaxed environment. Each week brings a new speaker or baby-centered activity. We encourage new mothers to join us [place] Thursday at 11:00am. Babies birth until crawling. No registration or fee. Daddy BooWESCO Internationalhis course offers Dads-to-be the tools and knowledge needed to feel confident on their journey to becoming new fathers. Experienced dads, who have been trained as coaches, teach dads-to-be how to hold, comfort, diaper, swaddle and play with their infant while being able to support the new mom as well. A class for men taught by men. $25/dad Big Brother/Big Sister: Let your children share in the joy of a new brother or sister in this special class designed just for them. Class includes discussion about how families care for babies: swaddling, holding, diapering, safety as well as how they can be helpful in their new role. This class is designed for children ages 2 t10 6, 33ut any age is welcome. Please register each child individually. $5/child  Mom Talk: This mom-led group offers support and connection to mothers as they journey through the adjustments and struggles of that sometimes overwhelming first  year after the birth of a child. Tuesdays at 10:00am and Thursdays at 6:00pm. Babies welcome. No registration or fee. Breastfeeding Support Group: This group is a mother-to-mother support circle where moms have the opportunity to share their breastfeeding experiences. A Lactation Consultant is present for questions and concerns. Meets each Tuesday at 11:00am. No fee or registration. Breastfeeding Your Baby: Learn what to expect in the first days of breastfeeding your newborn.  This class will help you feel more confident with the skills needed to  begin your breastfeeding experience. Many new mothers are concerned about breastfeeding after leaving the hospital. This class will also address the most common fears and challenges about breastfeeding during the first few weeks, months and beyond. (call for fee) Comfort Techniques and Tour: This 2 hour interactive class will provide you the opportunity to learn & practice hands-on techniques that can help relieve some of the discomfort of labor and encourage your baby to rotate toward the best position for birth. You and your partner will be able to try a variety of labor positions with birth balls and rebozos as well as practice breathing, relaxation, and visualization techniques. A tour of the [facility] is included with this class. $20 per registrant and support person Childbirth Class- Weekend Option: This class is a Weekend version of our Birth & Baby series. It is designed for parents who have a difficult time fitting several weeks of classes into their schedule. It covers the care of your newborn and the basics of labor and childbirth. It also includes a [facility] and lunch. The class is held two consecutive days: beginning on Friday evening from 6:30 - 8:30 p.m. and the next day, Saturday from 9 a.m. - 4 p.m. (call for fee) Doren Custard Class: Interested in a waterbirth?  This informational class will help you discover whether waterbirth is the right fit for you. Education about waterbirth itself, supplies you would need and how to assemble your support team is what you can expect from this class. Some obstetrical practices require this class in order to pursue a waterbirth. (Not all obstetrical practices offer waterbirth-check with your healthcare provider.) Register only the expectant mom, but you are encouraged to bring your partner to class! Required if planning waterbirth, no fee. Infant/Child CPR: Parents, grandparents, babysitters,  and friends learn Cardio-Pulmonary Resuscitation skills for infants and children. You will also learn how to treat both conscious and unconscious choking in infants and children. This Family & Friends program does not offer certification. Register each participant individually to ensure that enough mannequins are available. (Call for fee) Grandparent Love: Expecting a grandbaby? This class is for you! Learn about the latest infant care and safety recommendations and ways to support your own child as he or she transitions into the parenting role. Taught by Registered Nurses who are childbirth instructors, but most importantly...they are grandmothers too! $10/person. Childbirth Class- Natural Childbirth: This series of 5 weekly classes is for expectant parents who want to learn and practice natural methods of coping with the process of labor and childbirth. Relaxation, breathing, massage, visualization, role of the partner, and helpful positioning are highlighted. Participants learn how to be confident in their body's ability to give birth. This class will empower and help parents make informed decisions about their own care. Includes discussion that will help new parents transition into the immediate postpartum period. [facility] is included. We suggest taking this class between  25-32 weeks, but it's only a recommendation. $75 per registrant and one support person or $30 Medicaid. Childbirth Class- 3 week Series: This option of 3 weekly classes helps you and your labor partner prepare for childbirth. Newborn care, labor & birth, cesarean birth, pain management, and comfort techniques are discussed and a [facility] is included. The class meets at the same time, on the same day of the week for 3 consecutive weeks beginning with the starting date you choose. $60 for registrant and one support person.  Marvelous Multiples: Expecting twins,  triplets, or more? This class covers the differences in labor, birth, parenting, and breastfeeding issues that face multiples' parents. NICU tour is included. Led by a Certified Childbirth Educator who is the mother of twins. No fee. Caring for Baby: This class is for expectant and adoptive parents who want to learn and practice the most up-to-date newborn care for their babies. Focus is on birth through the first six weeks of life. Topics include feeding, bathing, diapering, crying, umbilical cord care, circumcision care and safe sleep. Parents learn to recognize symptoms of illness and when to call the pediatrician. Register only the mom-to-be and your partner or support person can plan to come with you! $10 per registrant and support person Childbirth Class- online option: This online class offers you the freedom to complete a Birth and Baby series in the comfort of your own home. The flexibility of this option allows you to review sections at your own pace, at times convenient to you and your support people. It includes additional video information, animations, quizzes, and extended activities. Get organized with helpful eClass tools, checklists, and trackers. Once you register online for the class, you will receive an email within a few days to accept the invitation and begin the class when the time is right for you. The content will be available to you for 60 days. $60 for 60 days of online access for you and your support people.  Local Doulas: Natural Baby Doulas [email or web address] Tel: [phone] [email or web address] [employer] [phone] [email or web address] The Labor Hassell Halim  (also do waterbirth tub rental) [phone] [email or web address] Triad Birth Doula [phone] [email or web address] NotebookDistributors.fi Sacred Rhythms   [phone] https://[email or web address]-[email or web address]/ [employer] (PADA) [email or web address]://www.[email or web address]/ [place] Birth and Baby  [email or web address]://[email or web address]/ Considering Waterbirth? Guide for patients at Center for [employer]  Why consider waterbirth?  . Gentle birth for babies . Less pain medicine used in labor . May allow for passive descent/less pushing . May reduce perineal tears  . More mobility and instinctive maternal position changes . Increased maternal relaxation . Reduced blood pressure in labor  Is waterbirth safe? What are the risks of infection, drowning or other complications?  . Infection: o Very low risk (3.7 % for tub vs 4.8% for bed) o 7 in 8000 waterbirths with documented infection o Poorly cleaned equipment most common cause o Slightly lower group B strep transmission rate  . Drowning o Maternal:  - Very low risk   - Related to seizures or fainting o Newborn:  - Very low risk. No evidence of increased risk of respiratory problems in multiple large studies - Physiological protection from breathing under water - Avoid underwater birth if there are any fetal complications - Once baby's head is out of the water, keep it out.  . Birth complication o Some reports of cord trauma, but risk decreased by bringing baby to surface gradually o No evidence of increased risk of shoulder dystocia.  Mothers can usually change positions faster in water than in a bed, possibly aiding the maneuvers to free the shoulder.   You must attend a Doren Custard class at [facility]  3rd Wednesday of every month from 7-9pm  [employer] by calling [phone] or online at [email or web address]  Bring [place] the certificate from the class to your prenatal appointment  Meet with a midwife at 36 weeks to see if you can still plan a waterbirth and to sign the consent.   Purchase or rent the following supplies:   Fish  net  Bathing suit top (optional)  Long-handled mirror (optional)  Places to purchase or rent supplies  [email or web address].is for tub purchases and supplies  Waterbirthsolutions.com for tub purchases and supplies  The Labor Ladies (www.thelaborladies.com) $275 for tub rental/set-up & take down/kit   [employer] (http://www.fleming.com/.htm) Information regarding doulas (labor support) who provide pool rentals  Our practice has a Birth Pool in a Box tub at the hospital that you may borrow on a first-come-first-served basis. It is your responsibility to to set up, clean and break down the tub. We cannot guarantee the availability of this tub in advance. You are responsible for bringing all accessories listed above. If you do not have all necessary supplies you cannot have a waterbirth.    Things that would prevent you from having a waterbirth:  Premature, <37wks  Previous cesarean birth  Presence of thick meconium-stained fluid  Multiple gestation (Twins, triplets, etc.)  Uncontrolled diabetes or gestational diabetes requiring medication  Hypertension requiring medication or diagnosis of pre-eclampsia  Heavy vaginal bleeding  Non-reassuring fetal heart rate  Active infection (MRSA, etc.). Group B Strep is NOT a contraindication for waterbirth.  If your labor has to be induced and induction method requires continuous monitoring of the baby's heart rate  Other risks/issues identified by your obstetrical provider  Please remember that birth is unpredictable. Under certain unforeseeable circumstances your provider may advise against giving birth in the tub. These decisions will be made on a case-by-case basis and with the safety of you and your baby as our highest priority.

## 2018-07-21 ENCOUNTER — Ambulatory Visit (INDEPENDENT_AMBULATORY_CARE_PROVIDER_SITE_OTHER): Payer: Medicaid Other | Admitting: Advanced Practice Midwife

## 2018-07-21 ENCOUNTER — Other Ambulatory Visit: Payer: Medicaid Other

## 2018-07-21 ENCOUNTER — Encounter: Payer: Self-pay | Admitting: Advanced Practice Midwife

## 2018-07-21 VITALS — BP 97/63 | HR 92 | Wt 144.0 lb

## 2018-07-21 DIAGNOSIS — Z23 Encounter for immunization: Secondary | ICD-10-CM | POA: Diagnosis not present

## 2018-07-21 DIAGNOSIS — Z3402 Encounter for supervision of normal first pregnancy, second trimester: Secondary | ICD-10-CM

## 2018-07-21 DIAGNOSIS — Z3403 Encounter for supervision of normal first pregnancy, third trimester: Secondary | ICD-10-CM

## 2018-07-21 NOTE — Progress Notes (Signed)
   PRENATAL VISIT NOTE  Subjective:  Natasha Martinez is a 22 y.o. G1P0 at 2171w0d being seen today for ongoing prenatal care.  She is currently monitored for the following issues for this low-risk pregnancy and has Chronic migraine w/o aura w/o status migrainosus, not intractable; History of seizures as a child; Encounter for supervision of normal first pregnancy in second trimester; and Asthma in adult, mild intermittent, uncomplicated on their problem list.  Patient reports no complaints.  Contractions: Not present. Vag. Bleeding: None.  Movement: Present. Denies leaking of fluid.   The following portions of the patient's history were reviewed and updated as appropriate: allergies, current medications, past family history, past medical history, past social history, past surgical history and problem list. Problem list updated.  Objective:   Vitals:   07/21/18 0817  BP: 97/63  Pulse: 92  Weight: 144 lb (65.3 kg)    Fetal Status: Fetal Heart Rate (bpm): 145 Fundal Height: 27 cm Movement: Present     General:  Alert, oriented and cooperative. Patient is in no acute distress.  Skin: Skin is warm and dry. No rash noted.   Cardiovascular: Normal heart rate noted  Respiratory: Normal respiratory effort, no problems with respiration noted  Abdomen: Soft, gravid, appropriate for gestational age.  Pain/Pressure: Absent     Pelvic: Cervical exam deferred        Extremities: Normal range of motion.  Edema: None  Mental Status: Normal mood and affect. Normal behavior. Normal judgment and thought content.   Assessment and Plan:  Pregnancy: G1P0 at 7871w0d  1. Encounter for supervision of normal first pregnancy in third trimester - Routine care - 28 week labs today  - tdap today   Preterm labor symptoms and general obstetric precautions including but not limited to vaginal bleeding, contractions, leaking of fluid and fetal movement were reviewed in detail with the patient. Please refer to After  Visit Summary for other counseling recommendations.  Return in about 2 weeks (around 08/04/2018).  Future Appointments  Date Time Provider Department Center  07/21/2018  9:30 AM [facility]   [place]  DNP, [place]  07/21/18  8:38 AM

## 2018-07-21 NOTE — Patient Instructions (Signed)
AREA PEDIATRIC/FAMILY PRACTICE PHYSICIANS   [facility] [address], [address] [place], [place]  [address] Phone - [phone]   Fax - [phone]  [facility] [address] [place], [place] [address] Phone - [phone]   Fax - [phone]  [facility] AM[facility] [address] [place], [place]  [address] Phone - [phone]   Fax - [phone]  [facility] [address] [place], [place]  [address] Phone - [phone]   Fax - [phone]  [facility] [address] [place], [place]  [address] Phone - [phone]   Fax - [phone]  [facility] [address] [place], [place]  [address] Phone - [phone]   Fax - [phone]  [facility] [address] [place], [place]  [address] Phone - [phone]   Fax - [phone]  [facility] [address] [place], [place]  [address] Phone - [phone]   Fax - [phone]  [facility] [address] [place], [place]  [address] Phone - [phone]   Fax - [phone] [facility] [address] [place], [place]  [address] Phone - [phone]   Fax - [phone]  [facility] [address] [place], [place]  [address] Phone - [phone]   Fax - [phone]  [facility] [address] [place], [place]  [address] Phone - [phone]   Fax - [phone]  [facility] [address] [place], [place]  [address] Phone - [phone]   Fax - [phone]  SHILPA GOSRANI [address] [place], [place]  [address] Phone - [phone]  [facility] [address] [place], [place]  [address] Phone - [phone]   Fax - [phone]  [facility] [address] [place], [place]  [address] Phone - [phone]   Fax - [phone]  [facility] [address] [place], [place]  [address] Phone - [phone]   Fax - [phone]  [facility] [address] [place], [place]  [address] Phone - [phone]   Fax - [phone]   [facility] [address] [place], [place]  [address] Phone - [phone]   Fax - [phone]  [facility] [address] [place], [place]  [address] Phone - [phone]   Fax - [phone]  DAVID RUBIN [address] [place], [place]  [address] Phone - [phone]   Fax - [phone]  [facility] [address] [place], [place]  [address] Phone - [phone]   Fax - [phone]  [facility] [address] , [place]  [address] Phone - [phone]   Fax - [phone] [facility] [address] [place], [place]  [address] Phone - [phone]   Fax - [phone]  [facility] [address] [place], [place]  [address] Phone - [phone]   Fax - [phone]  [facility] [address] [place], [place]  [address] Phone - [phone]   Fax - [phone]  [facility] Charlene Flemming MD [address] [place]  [address] Phone [phone]  Fax [phone]  Childbirth Education Options: [facility] Classes:  Childbirth education classes can help you   get ready for a positive parenting experience. You can also meet other expectant parents and get free stuff for your baby. Each class runs for five weeks on the same night and costs $45 for the mother-to-be and her support person. Medicaid covers the cost if you are eligible. Call [phone] to register. [facility] Childbirth Education:  [phone] or [phone] or  [email or web address]  Baby & Me Class: Discuss newborn & infant parenting and family adjustment issues with other new mothers in a relaxed environment. Each week brings a new speaker or baby-centered activity. We encourage new mothers to join us every Thursday at 11:00am. Babies birth until crawling. No registration or fee. Daddy Boot Camp: This course offers Dads-to-be the tools and knowledge needed to feel confident on their journey to becoming new fathers. Experienced dads, who have been trained as coaches, teach dads-to-be how to hold, comfort, diaper, swaddle and play with their infant while being able to support the new mom as well. A class for men taught by men. $25/dad Big Brother/Big Sister: Let your children share in the joy of a new brother or sister in this special class designed just for them. Class includes discussion about how families care for babies: swaddling, holding, diapering, safety as well as how they can be helpful in their new role. This class is designed for children ages 2 to 6, but any age is welcome. Please register each child individually. $5/child  Mom Talk: This mom-led group offers support and connection to mothers as they journey through the adjustments and struggles of that sometimes overwhelming first year after the birth of a child. Tuesdays at 10:00am and Thursdays at 6:00pm. Babies welcome. No registration or fee. Breastfeeding Support Group: This group is a mother-to-mother support circle where moms have the opportunity to share their breastfeeding experiences. A Lactation Consultant is present for questions and concerns. Meets each Tuesday at 11:00am. No fee or registration. Breastfeeding Your Baby: Learn what to expect in the first days of breastfeeding your newborn.  This class will help you feel more confident with the skills needed to begin your breastfeeding experience. Many new mothers are concerned about breastfeeding after leaving the hospital. This class  will also address the most common fears and challenges about breastfeeding during the first few weeks, months and beyond. (call for fee) Comfort Techniques and Tour: This 2 hour interactive class will provide you the opportunity to learn & practice hands-on techniques that can help relieve some of the discomfort of labor and encourage your baby to rotate toward the best position for birth. You and your partner will be able to try a variety of labor positions with birth balls and rebozos as well as practice breathing, relaxation, and visualization techniques. A tour of the [facility] is included with this class. $20 per registrant and support person Childbirth Class- Weekend Option: This class is a Weekend version of our Birth & Baby series. It is designed for parents who have a difficult time fitting several weeks of classes into their schedule. It covers the care of your newborn and the basics of labor and childbirth. It also includes a Maternity Care Center Tour of [facility] and lunch. The class is held two consecutive days: beginning on Friday evening from 6:30 - 8:30 p.m. and the next day, Saturday from 9 a.m. - 4 p.m. (call for fee) Waterbirth Class: Interested in a waterbirth?  This informational class will help you discover whether waterbirth is the right fit for you.   Education about waterbirth itself, supplies you would need and how to assemble your support team is what you can expect from this class. Some obstetrical practices require this class in order to pursue a waterbirth. (Not all obstetrical practices offer waterbirth-check with your healthcare provider.) Register only the expectant mom, but you are encouraged to bring your partner to class! Required if planning waterbirth, no fee. Infant/Child CPR: Parents, grandparents, babysitters, and friends learn Cardio-Pulmonary Resuscitation skills for infants and children. You will also learn how to treat both conscious  and unconscious choking in infants and children. This Family & Friends program does not offer certification. Register each participant individually to ensure that enough mannequins are available. (Call for fee) Grandparent Love: Expecting a grandbaby? This class is for you! Learn about the latest infant care and safety recommendations and ways to support your own child as he or she transitions into the parenting role. Taught by Registered Nurses who are childbirth instructors, but most importantly...they are grandmothers too! $10/person. Childbirth Class- Natural Childbirth: This series of 5 weekly classes is for expectant parents who want to learn and practice natural methods of coping with the process of labor and childbirth. Relaxation, breathing, massage, visualization, role of the partner, and helpful positioning are highlighted. Participants learn how to be confident in their body's ability to give birth. This class will empower and help parents make informed decisions about their own care. Includes discussion that will help new parents transition into the immediate postpartum period. [facility] is included. We suggest taking this class between 25-32 weeks, but it's only a recommendation. $75 per registrant and one support person or $30 Medicaid. Childbirth Class- 3 week Series: This option of 3 weekly classes helps you and your labor partner prepare for childbirth. Newborn care, labor & birth, cesarean birth, pain management, and comfort techniques are discussed and a [place] [facility] is included. The class meets at the same time, on the same day of the week for 3 consecutive weeks beginning with the starting date you choose. $60 for registrant and one support person.  Marvelous Multiples: Expecting twins, triplets, or more? This class covers the differences in labor, birth, parenting, and breastfeeding issues that face multiples' parents.  NICU tour is included. Led by a Certified Childbirth Educator who is the mother of twins. No fee. Caring for Baby: This class is for expectant and adoptive parents who want to learn and practice the most up-to-date newborn care for their babies. Focus is on birth through the first six weeks of life. Topics include feeding, bathing, diapering, crying, umbilical cord care, circumcision care and safe sleep. Parents learn to recognize symptoms of illness and when to call the pediatrician. Register only the mom-to-be and your partner or support person can plan to come with you! $10 per registrant and support person Childbirth Class- online option: This online class offers you the freedom to complete a Birth and Baby series in the comfort of your own home. The flexibility of this option allows you to review sections at your own pace, at times convenient to you and your support people. It includes additional video information, animations, quizzes, and extended activities. Get organized with helpful eClass tools, checklists, and trackers. Once you register online for the class, you will receive an email within a few days to accept the invitation and begin the class when the time is right for you. The content will be available to you for 60 days. $  60 for 60 days of online access for you and your support people.  Local Doulas: Natural Baby Doulas [email or web address] Tel: [phone] [email or web address] [facility] [phone] [email or web address] The Labor Ladies  (also do waterbirth tub rental) [phone] [email or web address] [email or web address] [phone] [email or web address] [employer]hyth[employer]  [phone] [email or web address] [facility] [employer] (PADA) [email or web address] http://www.padanc.org/index.htm [place] [facility]   [email or web address] Considering Waterbirth? Guide for patients at [facility]  Why consider waterbirth?  . Gentle birth for babies . Less pain medicine used in labor . May allow for passive descent/less pushing . May reduce perineal tears  . More mobility and instinctive maternal position changes . Increased maternal relaxation . Reduced blood pressure in labor  Is waterbirth safe? What are the risks of infection, drowning or other complications?  . Infection: o Very low risk (3.7 % for tub vs 4.8% for bed) o 7 in 8000 waterbirths with documented infection o Poorly cleaned equipment most common cause o Slightly lower group B strep transmission rate  . Drowning o Maternal:  - Very low risk   - Related to seizures or fainting o Newborn:  - Very low risk. No evidence of increased risk of respiratory problems in multiple large studies - Physiological protection from breathing under water - Avoid underwater birth if there are any fetal complications - Once baby's head is out of the water, keep it out.  . Birth complication o Some reports of cord trauma, but risk decreased by bringing baby to surface gradually o No evidence of increased risk of shoulder dystocia. Mothers can usually change positions faster in water than in a bed, possibly aiding the maneuvers to free the shoulder.   You must attend a Waterbirth class at [facility]  3rd Wednesday of every month from 7-9pm  Free  Register by calling [phone] or online at [email or web address]  Bring us the certificate from the class to your prenatal appointment  Meet with a midwife at 36 weeks to see if you can still plan a waterbirth and to sign the consent.   Purchase or rent the following supplies:   Water Birth Pool (Birth Pool in a Box or [place] for instance)  (Tubs start ~$125)  Single-use disposable tub liner designed for your brand of tub  New garden hose labeled  "lead-free", "suitable for drinking water",  Electric drain pump to remove water (We recommend 792 gallon per hour or greater pump.)   Separate garden hose to remove the dirty water  Fish net  Bathing suit top (optional)  Long-handled mirror (optional)  Places to purchase or rent supplies  Yourwaterbirth.com for tub purchases and supplies  Waterbirthsolutions.com for tub purchases and supplies  The Labor Ladies (www.[email or web address]) $275 for tub rental/set-up & take down/kit   [facility] [employer] (http://www.padanc.org/MeetUs.htm) Information regarding doulas (labor support) who provide pool rentals  Our practice has a Birth Pool in a Box tub at the hospital that you may borrow on a first-come-first-served basis. It is your responsibility to to set up, clean and break down the tub. We cannot guarantee the availability of this tub in advance. You are responsible for bringing all accessories listed above. If you do not have all necessary supplies you cannot have a waterbirth.    Things that would prevent you from having a waterbirth:  Premature, <37wks  Previous cesarean birth  Presence of thick meconium-stained fluid  Multiple gestation (Twins, triplets, etc.)  Uncontrolled diabetes or gestational diabetes requiring medication  Hypertension requiring medication   or diagnosis of pre-eclampsia  Heavy vaginal bleeding  Non-reassuring fetal heart rate  Active infection (MRSA, etc.). Group B Strep is NOT a contraindication for  waterbirth.  If your labor has to be induced and induction method requires continuous  monitoring of the baby's heart rate  Other risks/issues identified by your obstetrical provider  Please remember that birth is unpredictable. Under certain unforeseeable circumstances your provider may advise against giving birth in the tub. These decisions will be made on a case-by-case basis and with the safety of you and your baby as our highest  priority.      

## 2018-07-21 NOTE — Addendum Note (Signed)
Addended by: Duwaine MaxinSALMONS, Krisi Azua L on: 07/21/2018 08:24 AM   Modules accepted: Orders

## 2018-07-22 ENCOUNTER — Other Ambulatory Visit: Payer: Self-pay | Admitting: Advanced Practice Midwife

## 2018-07-22 LAB — CBC
Hematocrit: 26.7 % — ABNORMAL LOW (ref 34.0–46.6)
Hemoglobin: 8.9 g/dL — ABNORMAL LOW (ref 11.1–15.9)
MCH: 29 pg (ref 26.6–33.0)
MCHC: 33.3 g/dL (ref 31.5–35.7)
MCV: 87 fL (ref 79–97)
PLATELETS: 249 10*3/uL (ref 150–450)
RBC: 3.07 x10E6/uL — ABNORMAL LOW (ref 3.77–5.28)
RDW: 12.5 % (ref 12.3–15.4)
WBC: 6.8 10*3/uL (ref 3.4–10.8)

## 2018-07-22 LAB — RPR: RPR: NONREACTIVE

## 2018-07-22 LAB — GLUCOSE TOLERANCE, 2 HOURS W/ 1HR
GLUCOSE, 2 HOUR: 82 mg/dL (ref 65–152)
Glucose, 1 hour: 109 mg/dL (ref 65–179)
Glucose, Fasting: 73 mg/dL (ref 65–91)

## 2018-07-22 LAB — HIV ANTIBODY (ROUTINE TESTING W REFLEX): HIV Screen 4th Generation wRfx: NONREACTIVE

## 2018-07-22 MED ORDER — CITRANATAL 90 DHA 90-1 & 300 MG PO MISC: 1.0000 | Freq: Every day | ORAL | 11 refills | Status: DC
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
## 2018-08-04 ENCOUNTER — Ambulatory Visit (INDEPENDENT_AMBULATORY_CARE_PROVIDER_SITE_OTHER): Payer: Medicaid Other | Admitting: Advanced Practice Midwife

## 2018-08-04 ENCOUNTER — Encounter: Payer: Self-pay | Admitting: Advanced Practice Midwife

## 2018-08-04 DIAGNOSIS — Z3402 Encounter for supervision of normal first pregnancy, second trimester: Secondary | ICD-10-CM

## 2018-08-04 DIAGNOSIS — Z3A28 28 weeks gestation of pregnancy: Secondary | ICD-10-CM

## 2018-08-04 DIAGNOSIS — Z3403 Encounter for supervision of normal first pregnancy, third trimester: Secondary | ICD-10-CM

## 2018-08-04 MED ORDER — ONDANSETRON 8 MG PO TBDP: 8.0000 mg | ORAL_TABLET | Freq: Three times a day (TID) | ORAL | 3 refills | Status: DC | PRN

## 2018-08-04 MED ORDER — COMFORT FIT MATERNITY SUPP SM MISC: 1.0000 | Freq: Every day | 0 refills | Status: DC | PRN

## 2018-08-04 MED ORDER — DOXYLAMINE-PYRIDOXINE ER 20-20 MG PO TBCR: 1.0000 | EXTENDED_RELEASE_TABLET | Freq: Two times a day (BID) | ORAL | 5 refills | Status: DC

## 2018-08-04 NOTE — Progress Notes (Signed)
   PRENATAL VISIT NOTE  Subjective:  Natasha Martinez is a 23 y.o. G1P0 at 5162w0d being seen today for ongoing prenatal care.  She is currently monitored for the following issues for this low-risk pregnancy and has Chronic migraine w/o aura w/o status migrainosus, not intractable; History of seizures as a child; Encounter for supervision of normal first pregnancy in second trimester; and Asthma in adult, mild intermittent, uncomplicated on their problem list.  Patient reports backache and nausea.  Contractions: Not present. Vag. Bleeding: None.  Movement: Present. Denies leaking of fluid.   The following portions of the patient's history were reviewed and updated as appropriate: allergies, current medications, past family history, past medical history, past social history, past surgical history and problem list. Problem list updated.  Objective:   Vitals:   08/04/18 1007  BP: 117/61  Pulse: 90  Weight: 145 lb 11.2 oz (66.1 kg)    Fetal Status: Fetal Heart Rate (bpm): 156 Fundal Height: 28 cm Movement: Present     General:  Alert, oriented and cooperative. Patient is in no acute distress.  Skin: Skin is warm and dry. No rash noted.   Cardiovascular: Normal heart rate noted  Respiratory: Normal respiratory effort, no problems with respiration noted  Abdomen: Soft, gravid, appropriate for gestational age.  Pain/Pressure: Absent     Pelvic: Cervical exam deferred        Extremities: Normal range of motion.  Edema: None  Mental Status: Normal mood and affect. Normal behavior. Normal judgment and thought content.   Assessment and Plan:  Pregnancy: G1P0 at 4862w0d  1. Encounter for supervision of normal first pregnancy in second trimester - Routine care - 2 hour GTT normal  - RX pregnancy support belt, zofran and bonjesta  - Info on where to get pregnancy belt given to patient - PHQ-9 was 8 and GAD-7 was 6. Offered for patient to see Asher MuirJamie today. She declines. She knows Asher MuirJamie is available if  needed.   Preterm labor symptoms and general obstetric precautions including but not limited to vaginal bleeding, contractions, leaking of fluid and fetal movement were reviewed in detail with the patient. Please refer to After Visit Summary for other counseling recommendations.  Return in about 2 weeks (around 08/18/2018).  No future appointments.  Thressa ShellerHeather Hogan DNP, CNM  08/04/18  10:24 AM

## 2018-08-04 NOTE — Patient Instructions (Addendum)
PREGNANCY SUPPORT BELT: You are not alone, Seventy-five percent of women have some sort of abdominal or back pain at some point in their pregnancy. Your baby is growing at a fast pace, which means that your whole body is rapidly trying to adjust to the changes. As your uterus grows, your back may start feeling a bit under stress and this can result in back or abdominal pain that can go from mild, and therefore bearable, to severe pains that will not allow you to sit or lay down comfortably, When it comes to dealing with pregnancy-related pains and cramps, some pregnant women usually prefer natural remedies, which the market is filled with nowadays. For example, wearing a pregnancy support belt can help ease and lessen your discomfort and pain. WHAT ARE THE BENEFITS OF WEARING A PREGNANCY SUPPORT BELT? A pregnancy support belt provides support to the lower portion of the belly taking some of the weight of the growing uterus and distributing to the other parts of your body. It is designed make you comfortable and gives you extra support. Over the years, the pregnancy apparel market has been studying the needs and wants of pregnant women and they have come up with the most comfortable pregnancy support belts that woman could ever ask for. In fact, you will no longer have to wear a stretched-out or bulky pregnancy belt that is visible underneath your clothes and makes you feel even more uncomfortable. Nowadays, a pregnancy support belt is made of comfortable and stretchy materials that will not irritate your skin but will actually make you feel at ease and you will not even notice you are wearing it. They are easy to put on and adjust during the day and can be worn at night for additional support.  BENEFITS: . Relives Back pain . Relieves Abdominal Muscle and Leg Pain . Stabilizes the Pelvic Ring . Offers a Cushioned Abdominal Lift Pad . Relieves pressure on the Sciatic Nerve Within Minutes WHERE TO GET  YOUR PREGNANCY BELT: [employer] [phone] [phone], [place] [address]   Third Trimester of Pregnancy The third trimester is from week 28 through week 40 (months 7 through 9). The third trimester is a time when the unborn baby (fetus) is growing rapidly. At the end of the ninth month, the fetus is about 20 inches in length and weighs 6-10 pounds. Body changes during your third trimester Your body will continue to go through many changes during pregnancy. The changes vary from woman to woman. During the third trimester:  Your weight will continue to increase. You can expect to gain 25-35 pounds (11-16 kg) by the end of the pregnancy.  You may begin to get stretch marks on your hips, abdomen, and breasts.  You may urinate more often because the fetus is moving lower into your pelvis and pressing on your bladder.  You may develop or continue to have heartburn. This is caused by increased hormones that slow down muscles in the digestive tract.  You may develop or continue to have constipation because increased hormones slow digestion and cause the muscles that push waste through your intestines to relax.  You may develop hemorrhoids. These are swollen veins (varicose veins) in the rectum that can itch or be painful.  You may develop swollen, bulging veins (varicose veins) in your legs.  You may have increased body aches in the pelvis, back, or thighs. This is due to weight gain and increased hormones that are relaxing  your joints.  You may have changes in your hair. These can include thickening of your hair, rapid growth, and changes in texture. Some women also have hair loss during or after pregnancy, or hair that feels dry or thin. Your hair will most likely return to normal after your baby is born.  Your breasts will continue to grow and they will continue to become tender. A yellow fluid (colostrum) may leak from your breasts. This is the first milk you  are producing for your baby.  Your belly button may stick out.  You may notice more swelling in your hands, face, or ankles.  You may have increased tingling or numbness in your hands, arms, and legs. The skin on your belly may also feel numb.  You may feel short of breath because of your expanding uterus.  You may have more problems sleeping. This can be caused by the size of your belly, increased need to urinate, and an increase in your body's metabolism.  You may notice the fetus "dropping," or moving lower in your abdomen (lightening).  You may have increased vaginal discharge.  You may notice your joints feel loose and you may have pain around your pelvic bone. What to expect at prenatal visits You will have prenatal exams every 2 weeks until week 36. Then you will have weekly prenatal exams. During a routine prenatal visit:  You will be weighed to make sure you and the baby are growing normally.  Your blood pressure will be taken.  Your abdomen will be measured to track your baby's growth.  The fetal heartbeat will be listened to.  Any test results from the previous visit will be discussed.  You may have a cervical check near your due date to see if your cervix has softened or thinned (effaced).  You will be tested for Group B streptococcus. This happens between 35 and 37 weeks. Your health care provider may ask you:  What your birth plan is.  How you are feeling.  If you are feeling the baby move.  If you have had any abnormal symptoms, such as leaking fluid, bleeding, severe headaches, or abdominal cramping.  If you are using any tobacco products, including cigarettes, chewing tobacco, and electronic cigarettes.  If you have any questions. Other tests or screenings that may be performed during your third trimester include:  Blood tests that check for low iron levels (anemia).  Fetal testing to check the health, activity level, and growth of the fetus. Testing  is done if you have certain medical conditions or if there are problems during the pregnancy.  Nonstress test (NST). This test checks the health of your baby to make sure there are no signs of problems, such as the baby not getting enough oxygen. During this test, a belt is placed around your belly. The baby is made to move, and its heart rate is monitored during movement. What is false labor? False labor is a condition in which you feel small, irregular tightenings of the muscles in the womb (contractions) that usually go away with rest, changing position, or drinking water. These are called Braxton Hicks contractions. Contractions may last for hours, days, or even weeks before true labor sets in. If contractions come at regular intervals, become more frequent, increase in intensity, or become painful, you should see your health care provider. What are the signs of labor?  Abdominal cramps.  Regular contractions that start at 10 minutes apart and become stronger and more frequent with  time.  Contractions that start on the top of the uterus and spread down to the lower abdomen and back.  Increased pelvic pressure and dull back pain.  A watery or bloody mucus discharge that comes from the vagina.  Leaking of amniotic fluid. This is also known as your "water breaking." It could be a slow trickle or a gush. Let your health care provider know if it has a color or strange odor. If you have any of these signs, call your health care provider right away, even if it is before your due date. Follow these instructions at home: Medicines  Follow your health care provider's instructions regarding medicine use. Specific medicines may be either safe or unsafe to take during pregnancy.  Take a prenatal vitamin that contains at least 600 micrograms (mcg) of folic acid.  If you develop constipation, try taking a stool softener if your health care provider approves. Eating and drinking   Eat a balanced  diet that includes fresh fruits and vegetables, whole grains, good sources of protein such as meat, eggs, or tofu, and low-fat dairy. Your health care provider will help you determine the amount of weight gain that is right for you.  Avoid raw meat and uncooked cheese. These carry germs that can cause birth defects in the baby.  If you have low calcium intake from food, talk to your health care provider about whether you should take a daily calcium supplement.  Eat four or five small meals rather than three large meals a day.  Limit foods that are high in fat and processed sugars, such as fried and sweet foods.  To prevent constipation: ? Drink enough fluid to keep your urine clear or pale yellow. ? Eat foods that are high in fiber, such as fresh fruits and vegetables, whole grains, and beans. Activity  Exercise only as directed by your health care provider. Most women can continue their usual exercise routine during pregnancy. Try to exercise for 30 minutes at least 5 days a week. Stop exercising if you experience uterine contractions.  Avoid heavy lifting.  Do not exercise in extreme heat or humidity, or at high altitudes.  Wear low-heel, comfortable shoes.  Practice good posture.  You may continue to have sex unless your health care provider tells you otherwise. Relieving pain and discomfort  Take frequent breaks and rest with your legs elevated if you have leg cramps or low back pain.  Take warm sitz baths to soothe any pain or discomfort caused by hemorrhoids. Use hemorrhoid cream if your health care provider approves.  Wear a good support bra to prevent discomfort from breast tenderness.  If you develop varicose veins: ? Wear support pantyhose or compression stockings as told by your healthcare provider. ? Elevate your feet for 15 minutes, 3-4 times a day. Prenatal care  Write down your questions. Take them to your prenatal visits.  Keep all your prenatal visits as told  by your health care provider. This is important. Safety  Wear your seat belt at all times when driving.  Make a list of emergency phone numbers, including numbers for family, friends, the hospital, and police and fire departments. General instructions  Avoid cat litter boxes and soil used by cats. These carry germs that can cause birth defects in the baby. If you have a cat, ask someone to clean the litter box for you.  Do not travel far distances unless it is absolutely necessary and only with the approval of your health care  provider.  Do not use hot tubs, steam rooms, or saunas.  Do not drink alcohol.  Do not use any products that contain nicotine or tobacco, such as cigarettes and e-cigarettes. If you need help quitting, ask your health care provider.  Do not use any medicinal herbs or unprescribed drugs. These chemicals affect the formation and growth of the baby.  Do not douche or use tampons or scented sanitary pads.  Do not cross your legs for long periods of time.  To prepare for the arrival of your baby: ? Take prenatal classes to understand, practice, and ask questions about labor and delivery. ? Make a trial run to the hospital. ? Visit the hospital and tour the maternity area. ? Arrange for maternity or paternity leave through employers. ? Arrange for family and friends to take care of pets while you are in the hospital. ? Purchase a rear-facing car seat and make sure you know how to install it in your car. ? Pack your hospital bag. ? Prepare the baby's nursery. Make sure to remove all pillows and stuffed animals from the baby's crib to prevent suffocation.  Visit your dentist if you have not gone during your pregnancy. Use a soft toothbrush to brush your teeth and be gentle when you floss. Contact a health care provider if:  You are unsure if you are in labor or if your water has broken.  You become dizzy.  You have mild pelvic cramps, pelvic pressure, or  nagging pain in your abdominal area.  You have lower back pain.  You have persistent nausea, vomiting, or diarrhea.  You have an unusual or bad smelling vaginal discharge.  You have pain when you urinate. Get help right away if:  Your water breaks before 37 weeks.  You have regular contractions less than 5 minutes apart before 37 weeks.  You have a fever.  You are leaking fluid from your vagina.  You have spotting or bleeding from your vagina.  You have severe abdominal pain or cramping.  You have rapid weight loss or weight gain.  You have shortness of breath with chest pain.  You notice sudden or extreme swelling of your face, hands, ankles, feet, or legs.  Your baby makes fewer than 10 movements in 2 hours.  You have severe headaches that do not go away when you take medicine.  You have vision changes. Summary  The third trimester is from week 28 through week 40, months 7 through 9. The third trimester is a time when the unborn baby (fetus) is growing rapidly.  During the third trimester, your discomfort may increase as you and your baby continue to gain weight. You may have abdominal, leg, and back pain, sleeping problems, and an increased need to urinate.  During the third trimester your breasts will keep growing and they will continue to become tender. A yellow fluid (colostrum) may leak from your breasts. This is the first milk you are producing for your baby.  False labor is a condition in which you feel small, irregular tightenings of the muscles in the womb (contractions) that eventually go away. These are called Braxton Hicks contractions. Contractions may last for hours, days, or even weeks before true labor sets in.  Signs of labor can include: abdominal cramps; regular contractions that start at 10 minutes apart and become stronger and more frequent with time; watery or bloody mucus discharge that comes from the vagina; increased pelvic pressure and dull back  pain; and leaking of  amniotic fluid. This information is not intended to replace advice given to you by your health care provider. Make sure you discuss any questions you have with your health care provider. Document Released: 07/03/2001 Document Revised: 08/14/2016 Document Reviewed: 08/14/2016 Elsevier Interactive Patient Education  2019 [employer].

## 2018-08-12 ENCOUNTER — Emergency Department (HOSPITAL_COMMUNITY)
Admission: EM | Admit: 2018-08-12 | Discharge: 2018-08-12 | Disposition: A | Discharge status: Home / Self Care | Payer: Medicaid Other | Attending: Emergency Medicine | Admitting: Emergency Medicine

## 2018-08-12 ENCOUNTER — Other Ambulatory Visit: Payer: Self-pay

## 2018-08-12 ENCOUNTER — Encounter (HOSPITAL_COMMUNITY): Payer: Self-pay | Admitting: Emergency Medicine

## 2018-08-12 DIAGNOSIS — J45909 Unspecified asthma, uncomplicated: Secondary | ICD-10-CM | POA: Insufficient documentation

## 2018-08-12 DIAGNOSIS — Z87891 Personal history of nicotine dependence: Secondary | ICD-10-CM | POA: Insufficient documentation

## 2018-08-12 DIAGNOSIS — R1013 Epigastric pain: Secondary | ICD-10-CM | POA: Diagnosis not present

## 2018-08-12 DIAGNOSIS — R079 Chest pain, unspecified: Secondary | ICD-10-CM | POA: Diagnosis not present

## 2018-08-12 DIAGNOSIS — O26893 Other specified pregnancy related conditions, third trimester: Secondary | ICD-10-CM | POA: Diagnosis not present

## 2018-08-12 DIAGNOSIS — Z3402 Encounter for supervision of normal first pregnancy, second trimester: Secondary | ICD-10-CM

## 2018-08-12 DIAGNOSIS — Z79899 Other long term (current) drug therapy: Secondary | ICD-10-CM | POA: Insufficient documentation

## 2018-08-12 DIAGNOSIS — Z3A29 29 weeks gestation of pregnancy: Secondary | ICD-10-CM | POA: Diagnosis not present

## 2018-08-12 LAB — URINALYSIS, ROUTINE W REFLEX MICROSCOPIC
Bilirubin Urine: NEGATIVE
Glucose, UA: NEGATIVE mg/dL
Hgb urine dipstick: NEGATIVE
Ketones, ur: NEGATIVE mg/dL
Nitrite: NEGATIVE
Protein, ur: NEGATIVE mg/dL
Specific Gravity, Urine: 1.013 (ref 1.005–1.030)
pH: 7 (ref 5.0–8.0)

## 2018-08-12 LAB — COMPREHENSIVE METABOLIC PANEL
ALT: 18 U/L (ref 0–44)
AST: 23 U/L (ref 15–41)
Albumin: 3 g/dL — ABNORMAL LOW (ref 3.5–5.0)
Alkaline Phosphatase: 48 U/L (ref 38–126)
Anion gap: 9 (ref 5–15)
BUN: 8 mg/dL (ref 6–20)
CO2: 20 mmol/L — ABNORMAL LOW (ref 22–32)
Calcium: 8.7 mg/dL — ABNORMAL LOW (ref 8.9–10.3)
Chloride: 105 mmol/L (ref 98–111)
Creatinine, Ser: 0.82 mg/dL (ref 0.44–1.00)
GFR calc Af Amer: >60 mL/min (ref >60)
GFR calc non Af Amer: >60 mL/min (ref >60)
Glucose, Bld: 86 mg/dL (ref 70–99)
Potassium: 3.6 mmol/L (ref 3.5–5.1)
Sodium: 134 mmol/L — ABNORMAL LOW (ref 135–145)
Total Bilirubin: 0.4 mg/dL (ref 0.3–1.2)
Total Protein: 6.6 g/dL (ref 6.5–8.1)

## 2018-08-12 LAB — CBC WITH DIFFERENTIAL/PLATELET
Abs Immature Granulocytes: 0.08 10*3/uL — ABNORMAL HIGH (ref 0.00–0.07)
Basophils Absolute: 0 10*3/uL (ref 0.0–0.1)
Basophils Relative: 0 %
Eosinophils Absolute: 0.1 10*3/uL (ref 0.0–0.5)
Eosinophils Relative: 1 %
HCT: 30 % — ABNORMAL LOW (ref 36.0–46.0)
Hemoglobin: 9.3 g/dL — ABNORMAL LOW (ref 12.0–15.0)
Immature Granulocytes: 1 %
Lymphocytes Relative: 21 %
Lymphs Abs: 1.5 10*3/uL (ref 0.7–4.0)
MCH: 27.5 pg (ref 26.0–34.0)
MCHC: 31 g/dL (ref 30.0–36.0)
MCV: 88.8 fL (ref 80.0–100.0)
Monocytes Absolute: 0.6 10*3/uL (ref 0.1–1.0)
Monocytes Relative: 9 %
Neutro Abs: 4.9 10*3/uL (ref 1.7–7.7)
Neutrophils Relative %: 68 %
Platelets: 282 10*3/uL (ref 150–400)
RBC: 3.38 MIL/uL — ABNORMAL LOW (ref 3.87–5.11)
RDW: 12.2 % (ref 11.5–15.5)
WBC: 7.2 10*3/uL (ref 4.0–10.5)
nRBC: 0 % (ref 0.0–0.2)

## 2018-08-12 LAB — LIPASE, BLOOD: Lipase: 26 U/L (ref 11–51)

## 2018-08-12 MED ORDER — FAMOTIDINE 20 MG PO TABS: 20.0000 mg | ORAL_TABLET | Freq: Two times a day (BID) | ORAL | 0 refills | Status: DC

## 2018-08-12 NOTE — Progress Notes (Signed)
Pt D/C off monitors.

## 2018-08-12 NOTE — ED Notes (Signed)
Rapid response ob RN contacted and she states she will call back after talking with Dr. Adrian BlackwaterStinson.

## 2018-08-12 NOTE — ED Notes (Signed)
Rapid response OB at bedside

## 2018-08-12 NOTE — Progress Notes (Signed)
Pt cleared from [facility] service with reactive NST for 29 1/[redacted] week gestation.  Keep appt with Clinic on Monday 08/18/18.

## 2018-08-12 NOTE — ED Triage Notes (Signed)
Pt reports substernal CP since last night. Described as tightness. Pt [redacted] weeks pregnant. Reports when the baby moves pain is worse or up walking feels SOB, sitting feels like everything is squished. Pt denies radiation of pain. Fevers. VSS. NAD at present. Denies vaginal bleeding, contractions. Reports ongoing prenatal care.

## 2018-08-12 NOTE — ED Provider Notes (Signed)
[facility] Provider Note   CSN: [id number] Arrival date & time: 08/12/18  [id number]     History   Chief Complaint Chief Complaint  Patient presents with  . Chest Pain    HPI Natasha Martinez is a 23 y.o. female.  HPI   22yF with epigastric/chest pain.  Onset yesterday afternoon.  Persistent since then.  Describes a pressure sensation.  Worse when she lays down and also feels abnormal and her baby moves.  Worse of breath.  No fevers or chills.  No unusual swelling.  No history of similar symptoms prior to yesterday.  She is proximally [redacted] weeks pregnant.  No prior problems with this pregnancy.  First pregnancy.  She has had OB care.  Past Medical History:  Diagnosis Date  . Asthma    hasn't used inhaler in years  . Migraine   . Seizures (HCC)    as a child, last time when 89 yrs old    Patient Active Problem List   Diagnosis Date Noted  . Encounter for supervision of normal first pregnancy in second trimester 05/23/2018  . Asthma in adult, mild intermittent, uncomplicated 05/23/2018  . Chronic migraine w/o aura w/o status migrainosus, not intractable 08/04/2015  . History of seizures as a child 08/04/2015    Past Surgical History:  Procedure Laterality Date  . Wisdom tooth removal       OB History    Gravida  1   Para      Term      Preterm      AB      Living        SAB      TAB      Ectopic      Multiple      Live Births               Home Medications    Prior to Admission medications   Medication Sig Start Date End Date Taking? Authorizing Provider  albuterol (PROVENTIL HFA;VENTOLIN HFA) 108 (90 BASE) MCG/ACT inhaler Inhale 2 puffs into the lungs 4 (four) times daily. Patient taking differently: Inhale 2 puffs into the lungs every 6 (six) hours as needed for wheezing or shortness of breath.  02/22/13   Reuben LikesKeller, David C, MD  Doxylamine-Pyridoxine ER (BONJESTA) 20-20 MG TBCR Take 1 tablet by mouth 2 (two)  times daily. 08/04/18   Armando ReichertHogan, Heather D, CNM  Elastic Bandages & Supports (COMFORT FIT MATERNITY SUPP SM) MISC 1 Device by Does not apply route daily as needed. 08/04/18   Thressa ShellerHogan, Heather D, CNM  ondansetron (ZOFRAN ODT) 8 MG disintegrating tablet Take 1 tablet (8 mg total) by mouth every 8 (eight) hours as needed for nausea or vomiting. 08/04/18   Armando ReichertHogan, Heather D, CNM  Prenat w/o A-FeCbGl-DSS-FA-DHA (CITRANATAL 90 DHA) 90-1 & 300 MG MISC Take 1 tablet by mouth daily. 07/22/18   Armando ReichertHogan, Heather D, CNM    Family History History reviewed. No pertinent family history.  Social History Social History   Tobacco Use  . Smoking status: Former Smoker    Last attempt to quit: 01/2018    Years since quitting: 0.5  . Smokeless tobacco: Never Used  Substance Use Topics  . Alcohol use: No  . Drug use: No     Allergies   Patient has no known allergies.   Review of Systems Review of Systems  All systems reviewed and negative, other than as noted in HPI.  Physical Exam  Updated Vital Signs BP 121/65 (BP Location: Right Arm)   Pulse (!) 104   Temp 98.9 F (37.2 C) (Oral)   Resp 20   Ht 5\' 3"  (1.6 m)   Wt 65.3 kg   LMP 01/20/2018 (Within Days)   SpO2 100%   BMI 25.51 kg/m   Physical Exam Vitals signs and nursing note reviewed.  Constitutional:      General: She is not in acute distress.    Appearance: She is well-developed.  HENT:     Head: Normocephalic and atraumatic.  Eyes:     General:        Right eye: No discharge.        Left eye: No discharge.     Conjunctiva/sclera: Conjunctivae normal.  Neck:     Musculoskeletal: Neck supple.  Cardiovascular:     Rate and Rhythm: Normal rate and regular rhythm.     Heart sounds: Normal heart sounds. No murmur. No friction rub. No gallop.   Pulmonary:     Effort: Pulmonary effort is normal. No respiratory distress.     Breath sounds: Normal breath sounds.  Abdominal:     General: There is no distension.     Palpations: Abdomen is  soft.     Comments: Gravid uterus.  There is no abdominal tenderness.  Can feel baby movement.  Palpable fetal movement.  Musculoskeletal:        General: No tenderness.  Skin:    General: Skin is warm and dry.  Neurological:     Mental Status: She is alert.  Psychiatric:        Behavior: Behavior normal.        Thought Content: Thought content normal.      ED Treatments / Results  Labs (all labs ordered are listed, but only abnormal results are displayed) Labs Reviewed  COMPREHENSIVE METABOLIC PANEL - Abnormal; Notable for the following components:      Result Value   Sodium 134 (*)    CO2 20 (*)    Calcium 8.7 (*)    Albumin 3.0 (*)    All other components within normal limits  CBC WITH DIFFERENTIAL/PLATELET - Abnormal; Notable for the following components:   RBC 3.38 (*)    Hemoglobin 9.3 (*)    HCT 30.0 (*)    Abs Immature Granulocytes 0.08 (*)    All other components within normal limits  URINALYSIS, ROUTINE W REFLEX MICROSCOPIC - Abnormal; Notable for the following components:   APPearance HAZY (*)    Leukocytes, UA SMALL (*)    Bacteria, UA RARE (*)    All other components within normal limits  LIPASE, BLOOD    EKG EKG Interpretation  Date/Time:  Tuesday August 12 2018 09:28:38 EST Ventricular Rate:  106 PR Interval:    QRS Duration: 72 QT Interval:  323 QTC Calculation: 429 R Axis:   74 Text Interpretation:  Sinus tachycardia Baseline wander in lead(s) V5 Confirmed by Raeford Razor [phone]) on 08/12/2018 9:31:27 AM   Radiology No results found.  Procedures Procedures (including critical care time)  Medications Ordered in ED Medications - No data to display   Initial Impression / Assessment and Plan / ED Course  I have reviewed the triage vital signs and the nursing notes.  Pertinent labs & imaging results that were available during my care of the patient were reviewed by me and considered in my medical decision making (see chart for  details).     22yF with epigastric pain.  GERD? No tenderness on exam. HD stable. EKG/labs fine. Evaluate by OB rapid response. No acute OB concerns. Has follow-up appointment soon. Will give trial of PPI.   Final Clinical Impressions(s) / ED Diagnoses   Final diagnoses:  Epigastric pain    ED Discharge Orders    None       Raeford RazorKohut, Deaun Rocha, MD 08/18/18 1013

## 2018-08-12 NOTE — Progress Notes (Addendum)
G1P0 at 29 1/7 weeks reports to [facility] for c/o constant, tightening chest pain.  Chest pain 7/10.  Receiving [facility] at the Clinic at [facility].  Next PNV is Monday, 08/18/18.  No leaking or bleeding noted.  Pt reports good fetal movement.  Abdomen palpates soft and nontender. Dr Adrian Blackwater aware of pt admission.

## 2018-08-18 ENCOUNTER — Ambulatory Visit (INDEPENDENT_AMBULATORY_CARE_PROVIDER_SITE_OTHER): Payer: Medicaid Other | Admitting: Advanced Practice Midwife

## 2018-08-18 ENCOUNTER — Encounter: Payer: Self-pay | Admitting: Advanced Practice Midwife

## 2018-08-18 VITALS — BP 101/60 | HR 88 | Wt 152.3 lb

## 2018-08-18 DIAGNOSIS — Z3403 Encounter for supervision of normal first pregnancy, third trimester: Secondary | ICD-10-CM

## 2018-08-18 NOTE — Progress Notes (Signed)
   PRENATAL VISIT NOTE  Subjective:  Natasha Martinez is a 23 y.o. G1P0 at [redacted]w[redacted]d being seen today for ongoing prenatal care.  She is currently monitored for the following issues for this low-risk pregnancy and has Chronic migraine w/o aura w/o status migrainosus, not intractable; History of seizures as a child; Encounter for supervision of normal first pregnancy in second trimester; and Asthma in adult, mild intermittent, uncomplicated on their problem list.  Patient reports no complaints.  Contractions: Not present. Vag. Bleeding: None.  Movement: Present. Denies leaking of fluid.   The following portions of the patient's history were reviewed and updated as appropriate: allergies, current medications, past family history, past medical history, past social history, past surgical history and problem list. Problem list updated.  Objective:   Vitals:   08/18/18 1032  BP: 101/60  Pulse: 88  Weight: 152 lb 4.8 oz (69.1 kg)    Fetal Status: Fetal Heart Rate (bpm): 154 Fundal Height: 30 cm Movement: Present     General:  Alert, oriented and cooperative. Patient is in no acute distress.  Skin: Skin is warm and dry. No rash noted.   Cardiovascular: Normal heart rate noted  Respiratory: Normal respiratory effort, no problems with respiration noted  Abdomen: Soft, gravid, appropriate for gestational age.  Pain/Pressure: Absent     Pelvic: Cervical exam deferred        Extremities: Normal range of motion.  Edema: None  Mental Status: Normal mood and affect. Normal behavior. Normal judgment and thought content.   Assessment and Plan:  Pregnancy: G1P0 at [redacted]w[redacted]d  1. Encounter for supervision of normal first pregnancy in third trimester - Routine care - Started on PPI from the ED after visit due to chest pain. Patient has started taking yet. She states that she has been feeling better without it.   Preterm labor symptoms and general obstetric precautions including but not limited to vaginal  bleeding, contractions, leaking of fluid and fetal movement were reviewed in detail with the patient. Please refer to After Visit Summary for other counseling recommendations.  Return in about 2 weeks (around 09/01/2018).  Future Appointments  Date Time Provider Department Center  09/01/2018  2:35 PM Armando Reichert, CNM [facility]    Thressa Sheller DNP, CNM  08/18/18  10:56 AM

## 2018-08-21 DIAGNOSIS — Z1388 Encounter for screening for disorder due to exposure to contaminants: Secondary | ICD-10-CM | POA: Diagnosis not present

## 2018-08-21 DIAGNOSIS — Z3009 Encounter for other general counseling and advice on contraception: Secondary | ICD-10-CM | POA: Diagnosis not present

## 2018-08-21 DIAGNOSIS — Z0389 Encounter for observation for other suspected diseases and conditions ruled out: Secondary | ICD-10-CM | POA: Diagnosis not present

## 2018-09-01 ENCOUNTER — Encounter: Payer: Self-pay | Admitting: Advanced Practice Midwife

## 2018-09-01 ENCOUNTER — Ambulatory Visit (INDEPENDENT_AMBULATORY_CARE_PROVIDER_SITE_OTHER): Payer: Medicaid Other | Admitting: Advanced Practice Midwife

## 2018-09-01 VITALS — BP 102/62 | HR 90 | Wt 154.0 lb

## 2018-09-01 DIAGNOSIS — Z3402 Encounter for supervision of normal first pregnancy, second trimester: Secondary | ICD-10-CM

## 2018-09-01 DIAGNOSIS — Z3403 Encounter for supervision of normal first pregnancy, third trimester: Secondary | ICD-10-CM

## 2018-09-01 NOTE — Progress Notes (Signed)
   PRENATAL VISIT NOTE  Subjective:  Natasha Martinez is a 23 y.o. G1P0 at [redacted]w[redacted]d being seen today for ongoing prenatal care.  She is currently monitored for the following issues for this low-risk pregnancy and has Chronic migraine w/o aura w/o status migrainosus, not intractable; History of seizures as a child; Encounter for supervision of normal first pregnancy in second trimester; and Asthma in adult, mild intermittent, uncomplicated on their problem list.  Patient reports no complaints.  Contractions: Not present. Vag. Bleeding: None.  Movement: Present. Denies leaking of fluid.   The following portions of the patient's history were reviewed and updated as appropriate: allergies, current medications, past family history, past medical history, past social history, past surgical history and problem list. Problem list updated.  Objective:   Vitals:   09/01/18 1454  BP: 102/62  Pulse: 90  Weight: 154 lb (69.9 kg)    Fetal Status: Fetal Heart Rate (bpm): 145 Fundal Height: 33 cm Movement: Present     General:  Alert, oriented and cooperative. Patient is in no acute distress.  Skin: Skin is warm and dry. No rash noted.   Cardiovascular: Normal heart rate noted  Respiratory: Normal respiratory effort, no problems with respiration noted  Abdomen: Soft, gravid, appropriate for gestational age.  Pain/Pressure: Absent     Pelvic: Cervical exam deferred        Extremities: Normal range of motion.  Edema: None  Mental Status: Normal mood and affect. Normal behavior. Normal judgment and thought content.   Assessment and Plan:  Pregnancy: G1P0 at [redacted]w[redacted]d  1. Encounter for supervision of normal first pregnancy in third trimester - Routine care  Preterm labor symptoms and general obstetric precautions including but not limited to vaginal bleeding, contractions, leaking of fluid and fetal movement were reviewed in detail with the patient. Please refer to After Visit Summary for other counseling  recommendations.  Return in about 2 weeks (around 09/15/2018).  No future appointments.  Thressa Sheller DNP, CNM  09/01/18  3:24 PM

## 2018-09-05 ENCOUNTER — Encounter (HOSPITAL_COMMUNITY): Payer: Self-pay | Admitting: *Deleted

## 2018-09-05 ENCOUNTER — Inpatient Hospital Stay (HOSPITAL_COMMUNITY)
Admission: AD | Admit: 2018-09-05 | Discharge: 2018-09-05 | Disposition: A | Discharge status: Home / Self Care | Payer: Medicaid Other | Attending: Obstetrics & Gynecology | Admitting: Obstetrics & Gynecology

## 2018-09-05 ENCOUNTER — Other Ambulatory Visit: Payer: Self-pay

## 2018-09-05 DIAGNOSIS — Z87891 Personal history of nicotine dependence: Secondary | ICD-10-CM | POA: Diagnosis not present

## 2018-09-05 DIAGNOSIS — O23593 Infection of other part of genital tract in pregnancy, third trimester: Secondary | ICD-10-CM | POA: Insufficient documentation

## 2018-09-05 DIAGNOSIS — O26893 Other specified pregnancy related conditions, third trimester: Secondary | ICD-10-CM

## 2018-09-05 DIAGNOSIS — B373 Candidiasis of vulva and vagina: Secondary | ICD-10-CM | POA: Insufficient documentation

## 2018-09-05 DIAGNOSIS — Z3A32 32 weeks gestation of pregnancy: Secondary | ICD-10-CM | POA: Diagnosis not present

## 2018-09-05 DIAGNOSIS — R1032 Left lower quadrant pain: Secondary | ICD-10-CM | POA: Insufficient documentation

## 2018-09-05 DIAGNOSIS — N760 Acute vaginitis: Secondary | ICD-10-CM

## 2018-09-05 DIAGNOSIS — B9689 Other specified bacterial agents as the cause of diseases classified elsewhere: Secondary | ICD-10-CM | POA: Diagnosis not present

## 2018-09-05 DIAGNOSIS — B379 Candidiasis, unspecified: Secondary | ICD-10-CM

## 2018-09-05 DIAGNOSIS — O98813 Other maternal infectious and parasitic diseases complicating pregnancy, third trimester: Secondary | ICD-10-CM | POA: Diagnosis not present

## 2018-09-05 LAB — URINALYSIS, ROUTINE W REFLEX MICROSCOPIC
BILIRUBIN URINE: NEGATIVE
Glucose, UA: NEGATIVE mg/dL
Hgb urine dipstick: NEGATIVE
KETONES UR: NEGATIVE mg/dL
Leukocytes,Ua: NEGATIVE
NITRITE: NEGATIVE
PH: 6 (ref 5.0–8.0)
PROTEIN: NEGATIVE mg/dL
Specific Gravity, Urine: 1.015 (ref 1.005–1.030)

## 2018-09-05 LAB — WET PREP, GENITAL
Sperm: NONE SEEN
TRICH WET PREP: NONE SEEN

## 2018-09-05 MED ORDER — METRONIDAZOLE 500 MG PO TABS: 500.0000 mg | ORAL_TABLET | Freq: Two times a day (BID) | ORAL | 0 refills | Status: DC

## 2018-09-05 MED ORDER — TERCONAZOLE 0.4 % VA CREA: 1.0000 | TOPICAL_CREAM | Freq: Every day | VAGINAL | 0 refills | Status: DC

## 2018-09-05 NOTE — MAU Note (Signed)
Pt states she started having pelvic pressure yesterday, started having LLQ cramping today, is intermittent.  Denies bleeding or LOF.  Re[ports good fetal movement.

## 2018-09-05 NOTE — Discharge Instructions (Signed)

## 2018-09-05 NOTE — MAU Provider Note (Signed)
History     CSN: [id number]  Arrival date and time: 09/05/18 1409   First Provider Initiated Contact with Patient 09/05/18 1518      Chief Complaint  Patient presents with  . Abdominal Pain   HPI Natasha Martinez is a 23 y.o. G1P0 at [redacted]w[redacted]d who presents to MAU with chief complaint of pelvic pressure and LLQ cramping. She denies vaginal bleeding, leaking of fluid, decreased fetal movement, fever, falls, or recent illness.    LLQ cramping This is a new problem. Cramping is mild, rated as 3/10, does not radiate. Patient denies urinary symptomas, history of UTI or kidney stones. She denies aggravating or alleviating factors. She has not taken medication or tried other treatments for this problem.  Pelvic pressure This is a recurring problem exacerbated by patient's work at [employer]. She endorses bilateral "heavy feeling" in her pelvis which gradually worsens throughout the day.  OB History    Gravida  1   Para      Term      Preterm      AB      Living        SAB      TAB      Ectopic      Multiple      Live Births              Past Medical History:  Diagnosis Date  . Asthma    hasn't used inhaler in years  . Migraine   . Seizures (HCC)    as a child, last time when 63 yrs old    Past Surgical History:  Procedure Laterality Date  . Wisdom tooth removal      History reviewed. No pertinent family history.  Social History   Tobacco Use  . Smoking status: Former Smoker    Last attempt to quit: 01/2018    Years since quitting: 0.6  . Smokeless tobacco: Never Used  Substance Use Topics  . Alcohol use: No  . Drug use: No    Allergies: No Known Allergies  No medications prior to admission.    Review of Systems  Constitutional: Negative for chills and fatigue.  Gastrointestinal: Positive for abdominal pain.  Genitourinary: Positive for pelvic pain. Negative for difficulty urinating, vaginal bleeding, vaginal discharge and vaginal pain.   Musculoskeletal: Negative for back pain.  Neurological: Negative for dizziness, tremors, seizures and headaches.  All other systems reviewed and are negative.  Physical Exam   Blood pressure (!) 108/55, pulse 93, temperature 98.2 F (36.8 C), temperature source Oral, resp. rate 18, height 5\' 3"  (1.6 m), weight 69 kg, last menstrual period 01/20/2018.  Physical Exam  Nursing note and vitals reviewed. Constitutional: She is oriented to person, place, and time. She appears well-developed and well-nourished.  Cardiovascular: Normal rate.  Respiratory: Effort normal.  GI: Soft. She exhibits no distension. There is no abdominal tenderness. There is no rebound, no guarding and no CVA tenderness.  Gravid  Genitourinary:    Genitourinary Comments: Thick white discharge on swab collection, slightly foul smelling   Neurological: She is alert and oriented to person, place, and time.  Skin: Skin is warm and dry.  Psychiatric: She has a normal mood and affect. Her behavior is normal. Judgment and thought content normal.    MAU Course/MDM  Procedures  --Reactive tracing: baseline 145, moderate variability, positive accels, no decels --Toco: Uterine irritability --Closed cervix  Patient Vitals for the past 24 hrs:  BP Temp Temp src  Pulse Resp Height Weight  09/05/18 1613 (!) 108/55 - - - 18 - -  09/05/18 1427 (!) 133/57 98.2 F (36.8 C) Oral 93 18 5\' 3"  (1.6 m) 69 kg    Results for orders placed or performed during the hospital encounter of 09/05/18 (from the past 24 hour(s))  Wet prep, genital     Status: Abnormal   Collection Time: 09/05/18  2:09 PM  Result Value Ref Range   Yeast Wet Prep HPF POC PRESENT (A) NONE SEEN   Trich, Wet Prep NONE SEEN NONE SEEN   Clue Cells Wet Prep HPF POC PRESENT (A) NONE SEEN   WBC, Wet Prep HPF POC FEW (A) NONE SEEN   Sperm NONE SEEN   Urinalysis, Routine w reflex microscopic     Status: None   Collection Time: 09/05/18  2:39 PM  Result Value Ref  Range   Color, Urine YELLOW YELLOW   APPearance CLEAR CLEAR   Specific Gravity, Urine 1.015 1.005 - 1.030   pH 6.0 5.0 - 8.0   Glucose, UA NEGATIVE NEGATIVE mg/dL   Hgb urine dipstick NEGATIVE NEGATIVE   Bilirubin Urine NEGATIVE NEGATIVE   Ketones, ur NEGATIVE NEGATIVE mg/dL   Protein, ur NEGATIVE NEGATIVE mg/dL   Nitrite NEGATIVE NEGATIVE   Leukocytes,Ua NEGATIVE NEGATIVE   Meds ordered this encounter  Medications  . terconazole (TERAZOL 7) 0.4 % vaginal cream    Sig: Place 1 applicator vaginally at bedtime. Use for seven days    Dispense:  45 g    Refill:  0    Order Specific Question:   Supervising Provider    Answer:   Reva Bores [[id number]]  . metroNIDAZOLE (FLAGYL) 500 MG tablet    Sig: Take 1 tablet (500 mg total) by mouth 2 (two) times daily.    Dispense:  14 tablet    Refill:  0    Order Specific Question:   Supervising Provider    Answer:   Reva Bores [[id number]]     Assessment and Plan  --23 y.o. G1P0 at [redacted]w[redacted]d  --Reactive tracing --Yeast infection, Bacterial Vaginosis, Rx to patient pharmacy --Discharge home in stable condition  F/U: [facility] 09/16/2018  Calvert Cantor, CNM 09/05/2018, 4:31 PM

## 2018-09-08 LAB — GC/CHLAMYDIA PROBE AMP (~~LOC~~) NOT AT ARMC
Chlamydia: NEGATIVE
NEISSERIA GONORRHEA: NEGATIVE

## 2018-09-15 ENCOUNTER — Encounter (HOSPITAL_COMMUNITY): Payer: Self-pay

## 2018-09-15 ENCOUNTER — Other Ambulatory Visit: Payer: Self-pay

## 2018-09-15 ENCOUNTER — Inpatient Hospital Stay (HOSPITAL_COMMUNITY)
Admission: AD | Admit: 2018-09-15 | Discharge: 2018-09-15 | Disposition: A | Discharge status: Home / Self Care | Payer: Medicaid Other | Attending: Obstetrics & Gynecology | Admitting: Obstetrics & Gynecology

## 2018-09-15 DIAGNOSIS — O99013 Anemia complicating pregnancy, third trimester: Secondary | ICD-10-CM | POA: Diagnosis not present

## 2018-09-15 DIAGNOSIS — Z3689 Encounter for other specified antenatal screening: Secondary | ICD-10-CM

## 2018-09-15 DIAGNOSIS — D509 Iron deficiency anemia, unspecified: Secondary | ICD-10-CM

## 2018-09-15 DIAGNOSIS — D649 Anemia, unspecified: Secondary | ICD-10-CM | POA: Diagnosis not present

## 2018-09-15 DIAGNOSIS — Z87891 Personal history of nicotine dependence: Secondary | ICD-10-CM | POA: Insufficient documentation

## 2018-09-15 DIAGNOSIS — Z3A34 34 weeks gestation of pregnancy: Secondary | ICD-10-CM | POA: Insufficient documentation

## 2018-09-15 DIAGNOSIS — Z3402 Encounter for supervision of normal first pregnancy, second trimester: Secondary | ICD-10-CM

## 2018-09-15 DIAGNOSIS — R110 Nausea: Secondary | ICD-10-CM | POA: Diagnosis present

## 2018-09-15 LAB — URINALYSIS, ROUTINE W REFLEX MICROSCOPIC
BILIRUBIN URINE: NEGATIVE
GLUCOSE, UA: NEGATIVE mg/dL
HGB URINE DIPSTICK: NEGATIVE
Ketones, ur: NEGATIVE mg/dL
LEUKOCYTE UA: NEGATIVE
Nitrite: NEGATIVE
PROTEIN: NEGATIVE mg/dL
Specific Gravity, Urine: 1.01 (ref 1.005–1.030)
pH: 6 (ref 5.0–8.0)

## 2018-09-15 LAB — COMPREHENSIVE METABOLIC PANEL
ALT: 15 U/L (ref 0–44)
AST: 21 U/L (ref 15–41)
Albumin: 2.8 g/dL — ABNORMAL LOW (ref 3.5–5.0)
Alkaline Phosphatase: 80 U/L (ref 38–126)
Anion gap: 6 (ref 5–15)
BUN: 6 mg/dL (ref 6–20)
CHLORIDE: 108 mmol/L (ref 98–111)
CO2: 23 mmol/L (ref 22–32)
Calcium: 8.6 mg/dL — ABNORMAL LOW (ref 8.9–10.3)
Creatinine, Ser: 0.64 mg/dL (ref 0.44–1.00)
Glucose, Bld: 87 mg/dL (ref 70–99)
Potassium: 3.6 mmol/L (ref 3.5–5.1)
SODIUM: 137 mmol/L (ref 135–145)
Total Bilirubin: 0.1 mg/dL — ABNORMAL LOW (ref 0.3–1.2)
Total Protein: 6 g/dL — ABNORMAL LOW (ref 6.5–8.1)

## 2018-09-15 LAB — CBC
HCT: 29.2 % — ABNORMAL LOW (ref 36.0–46.0)
Hemoglobin: 9.2 g/dL — ABNORMAL LOW (ref 12.0–15.0)
MCH: 27 pg (ref 26.0–34.0)
MCHC: 31.5 g/dL (ref 30.0–36.0)
MCV: 85.6 fL (ref 80.0–100.0)
PLATELETS: 304 10*3/uL (ref 150–400)
RBC: 3.41 MIL/uL — ABNORMAL LOW (ref 3.87–5.11)
RDW: 14 % (ref 11.5–15.5)
WBC: 7.6 10*3/uL (ref 4.0–10.5)
nRBC: 0 % (ref 0.0–0.2)

## 2018-09-15 MED ORDER — ACETAMINOPHEN 325 MG PO TABS
650.0000 mg | ORAL_TABLET | Freq: Once | ORAL | Status: AC
  Administered 2018-09-15: 650 mg via ORAL
  Filled 2018-09-15: qty 2

## 2018-09-15 MED ORDER — FERROUS SULFATE 325 (65 FE) MG PO TABS: 325.0000 mg | ORAL_TABLET | ORAL | 0 refills | Status: DC

## 2018-09-15 NOTE — MAU Provider Note (Signed)
History     CSN: [id number]  Arrival date and time: 09/15/18 1436   First Provider Initiated Contact with Patient 09/15/18 1700      Chief Complaint  Patient presents with  . Nausea  . Dizziness  . Fatigue   HPI Natasha Martinez is a 23 y.o. G1P0 at [redacted]w[redacted]d who presents to MAU with chief complaints of nausea and dizzyness. This is a new problem, onset this morning. Patient denies vomiting, palpitations, SOB, weakness or syncope. She also denies vaginal bleeding, leaking of fluid, decreased fetal movement, fever, falls, or recent illness.    OB History    Gravida  1   Para      Term      Preterm      AB      Living        SAB      TAB      Ectopic      Multiple      Live Births              Past Medical History:  Diagnosis Date  . Asthma    hasn't used inhaler in years  . Migraine   . Seizures (HCC)    as a child, last time when 16 yrs old    Past Surgical History:  Procedure Laterality Date  . Wisdom tooth removal      History reviewed. No pertinent family history.  Social History   Tobacco Use  . Smoking status: Former Smoker    Last attempt to quit: 01/2018    Years since quitting: 0.6  . Smokeless tobacco: Never Used  Substance Use Topics  . Alcohol use: No  . Drug use: No    Allergies: No Known Allergies  Medications Prior to Admission  Medication Sig Dispense Refill Last Dose  . albuterol (PROVENTIL HFA;VENTOLIN HFA) 108 (90 BASE) MCG/ACT inhaler Inhale 2 puffs into the lungs 4 (four) times daily. (Patient not taking: Reported on 08/12/2018) 1 Inhaler 0 Not Taking  . Doxylamine-Pyridoxine ER (BONJESTA) 20-20 MG TBCR Take 1 tablet by mouth 2 (two) times daily. (Patient not taking: Reported on 08/12/2018) 60 tablet 5 Not Taking  . Elastic Bandages & Supports (COMFORT FIT MATERNITY SUPP SM) MISC 1 Device by Does not apply route daily as needed. (Patient not taking: Reported on 09/01/2018) 1 each 0 Not Taking  . famotidine (PEPCID) 20 MG  tablet Take 1 tablet (20 mg total) by mouth 2 (two) times daily. (Patient not taking: Reported on 09/01/2018) 30 tablet 0 Not Taking  . metroNIDAZOLE (FLAGYL) 500 MG tablet Take 1 tablet (500 mg total) by mouth 2 (two) times daily. 14 tablet 0   . ondansetron (ZOFRAN ODT) 8 MG disintegrating tablet Take 1 tablet (8 mg total) by mouth every 8 (eight) hours as needed for nausea or vomiting. (Patient not taking: Reported on 08/12/2018) 20 tablet 3 Not Taking at Unknown time  . Prenat w/o A-FeCbGl-DSS-FA-DHA (CITRANATAL 90 DHA) 90-1 & 300 MG MISC Take 1 tablet by mouth daily. 30 each 11 Taking  . terconazole (TERAZOL 7) 0.4 % vaginal cream Place 1 applicator vaginally at bedtime. Use for seven days 45 g 0     Review of Systems  Constitutional: Positive for fatigue. Negative for chills and fever.  Gastrointestinal: Positive for nausea. Negative for abdominal pain and vomiting.  Genitourinary: Negative for difficulty urinating, vaginal bleeding, vaginal discharge and vaginal pain.  Musculoskeletal: Negative for back pain.  Neurological: Positive for dizziness. Negative for  syncope and weakness.  All other systems reviewed and are negative.  Physical Exam   Blood pressure 106/68, pulse (!) 102, temperature 98.6 F (37 C), temperature source Oral, resp. rate 16, weight 68.9 kg, last menstrual period 01/20/2018, SpO2 100 %.  Physical Exam  Nursing note and vitals reviewed. Constitutional: She is oriented to person, place, and time. She appears well-developed and well-nourished.  Respiratory: Effort normal.  GI: Soft. She exhibits no distension. There is no abdominal tenderness. There is no rebound, no guarding and no CVA tenderness.  Gravid  Neurological: She is alert and oriented to person, place, and time.  Skin: Skin is warm and dry.  Psychiatric: She has a normal mood and affect. Her behavior is normal. Judgment and thought content normal.    MAU Course/MDM   -- Patient Vitals for the past  24 hrs:  BP Temp Temp src Pulse Resp SpO2 Weight  09/15/18 1814 95/67 - - - - - -  09/15/18 1512 106/68 98.6 F (37 C) Oral (!) 102 16 100 % 68.9 kg    Results for orders placed or performed during the hospital encounter of 09/15/18 (from the past 24 hour(s))  Urinalysis, Routine w reflex microscopic     Status: None   Collection Time: 09/15/18  4:00 PM  Result Value Ref Range   Color, Urine YELLOW YELLOW   APPearance CLEAR CLEAR   Specific Gravity, Urine 1.010 1.005 - 1.030   pH 6.0 5.0 - 8.0   Glucose, UA NEGATIVE NEGATIVE mg/dL   Hgb urine dipstick NEGATIVE NEGATIVE   Bilirubin Urine NEGATIVE NEGATIVE   Ketones, ur NEGATIVE NEGATIVE mg/dL   Protein, ur NEGATIVE NEGATIVE mg/dL   Nitrite NEGATIVE NEGATIVE   Leukocytes,Ua NEGATIVE NEGATIVE  CBC     Status: Abnormal   Collection Time: 09/15/18  4:50 PM  Result Value Ref Range   WBC 7.6 4.0 - 10.5 K/uL   RBC 3.41 (L) 3.87 - 5.11 MIL/uL   Hemoglobin 9.2 (L) 12.0 - 15.0 g/dL   HCT 31.5 (L) 40.0 - 86.7 %   MCV 85.6 80.0 - 100.0 fL   MCH 27.0 26.0 - 34.0 pg   MCHC 31.5 30.0 - 36.0 g/dL   RDW 61.9 50.9 - 32.6 %   Platelets 304 150 - 400 K/uL   nRBC 0.0 0.0 - 0.2 %  Comprehensive metabolic panel     Status: Abnormal   Collection Time: 09/15/18  4:50 PM  Result Value Ref Range   Sodium 137 135 - 145 mmol/L   Potassium 3.6 3.5 - 5.1 mmol/L   Chloride 108 98 - 111 mmol/L   CO2 23 22 - 32 mmol/L   Glucose, Bld 87 70 - 99 mg/dL   BUN 6 6 - 20 mg/dL   Creatinine, Ser 7.12 0.44 - 1.00 mg/dL   Calcium 8.6 (L) 8.9 - 10.3 mg/dL   Total Protein 6.0 (L) 6.5 - 8.1 g/dL   Albumin 2.8 (L) 3.5 - 5.0 g/dL   AST 21 15 - 41 U/L   ALT 15 0 - 44 U/L   Alkaline Phosphatase 80 38 - 126 U/L   Total Bilirubin 0.1 (L) 0.3 - 1.2 mg/dL   GFR calc non Af Amer >60 >60 mL/min   GFR calc Af Amer >60 >60 mL/min   Anion gap 6 5 - 15    Meds ordered this encounter  Medications  . acetaminophen (TYLENOL) tablet 650 mg  . ferrous sulfate 325 (65 FE) MG  tablet  Sig: Take 1 tablet (325 mg total) by mouth every other day.    Dispense:  30 tablet    Refill:  0    Order Specific Question:   Supervising Provider    Answer:   Reva BoresPRATT, TANYA S [[id number]]   Assessment and Plan  --23 y.o. G1P0 at 3988w0d  --Anemia, now symptomatic. Start separate iron supplement daily or every other day --Reactive tracing --Discharge home in stable condition  Calvert CantorSamantha C , CNM 09/15/2018, 6:55 PM

## 2018-09-15 NOTE — MAU Note (Signed)
Earlier today when at work, was getting hot, dizzy and nauseous,  Body feels weak.  Been on and off hot for 3 days, hasn't checked temp. No cough or sore throat.

## 2018-09-16 ENCOUNTER — Ambulatory Visit (INDEPENDENT_AMBULATORY_CARE_PROVIDER_SITE_OTHER): Payer: Medicaid Other | Admitting: Student

## 2018-09-16 VITALS — BP 95/61 | HR 92 | Wt 157.7 lb

## 2018-09-16 DIAGNOSIS — O99013 Anemia complicating pregnancy, third trimester: Secondary | ICD-10-CM | POA: Insufficient documentation

## 2018-09-16 DIAGNOSIS — O26843 Uterine size-date discrepancy, third trimester: Secondary | ICD-10-CM

## 2018-09-16 DIAGNOSIS — Z3403 Encounter for supervision of normal first pregnancy, third trimester: Secondary | ICD-10-CM

## 2018-09-16 DIAGNOSIS — O26849 Uterine size-date discrepancy, unspecified trimester: Secondary | ICD-10-CM | POA: Insufficient documentation

## 2018-09-16 DIAGNOSIS — Z3402 Encounter for supervision of normal first pregnancy, second trimester: Secondary | ICD-10-CM

## 2018-09-16 DIAGNOSIS — Z3A34 34 weeks gestation of pregnancy: Secondary | ICD-10-CM

## 2018-09-16 MED ORDER — DOCUSATE SODIUM 100 MG PO CAPS: 100.0000 mg | ORAL_CAPSULE | Freq: Two times a day (BID) | ORAL | 0 refills | Status: DC

## 2018-09-16 NOTE — Progress Notes (Signed)
   PRENATAL VISIT NOTE  Subjective:  Natasha Martinez is a 23 y.o. G1P0 at [redacted]w[redacted]d being seen today for ongoing prenatal care.  She is currently monitored for the following issues for this low-risk pregnancy and has Chronic migraine w/o aura w/o status migrainosus, not intractable; History of seizures as a child; Encounter for supervision of normal first pregnancy in second trimester; Asthma in adult, mild intermittent, uncomplicated; Anemia affecting pregnancy in third trimester; and Uterine size date discrepancy on their problem list.  Patient reports no complaints. She was in MAu yesterday because she was feeling weak and lightheaded. Was found to be anemica and given iron pills twice a day; they are not bothering her. .  Contractions: Not present. Vag. Bleeding: None.  Movement: Present. Denies leaking of fluid.   The following portions of the patient's history were reviewed and updated as appropriate: allergies, current medications, past family history, past medical history, past social history, past surgical history and problem list. Problem list updated.  Objective:   Vitals:   09/16/18 1314  BP: 95/61  Pulse: 92  Weight: 157 lb 11.2 oz (71.5 kg)    Fetal Status: Fetal Heart Rate (bpm): 150 Fundal Height: 31 cm Movement: Present     General:  Alert, oriented and cooperative. Patient is in no acute distress.  Skin: Skin is warm and dry. No rash noted.   Cardiovascular: Normal heart rate noted  Respiratory: Normal respiratory effort, no problems with respiration noted  Abdomen: Soft, gravid, appropriate for gestational age.  Pain/Pressure: Present     Pelvic: Cervical exam deferred        Extremities: Normal range of motion.  Edema: None  Mental Status: Normal mood and affect. Normal behavior. Normal judgment and thought content.   Assessment and Plan:  Pregnancy: G1P0 at [redacted]w[redacted]d  1. Anemia affecting pregnancy in third trimester -given Stool softener for constipation prevention  2.  Encounter for supervision of normal first pregnancy in second trimester  - [place] MFM OB FOLLOW UP; Future  3. Uterine size-date discrepancy in third trimester -[place] to check FH; measures 30 at 34 weeks.   Preterm labor symptoms and general obstetric precautions including but not limited to vaginal bleeding, contractions, leaking of fluid and fetal movement were reviewed in detail with the patient. Please refer to After Visit Summary for other counseling recommendations.  Return in about 2 weeks (around 09/30/2018), or LR[facility].  Future Appointments  Date Time Provider Department Center  09/30/2018  9:15 AM Marylene Land, CNM [facility]  10/14/2018  9:15 AM Marylene Land, CNM [facility]  10/21/2018  9:15 AM Marylene Land, CNM [facility]  10/28/2018  9:15 AM Marylene Land, CNM [facility]  11/04/2018  9:15 AM Crisoforo Oxford, Charlesetta Garibaldi, CNM [facility]    Marylene Land, [place]

## 2018-09-16 NOTE — Patient Instructions (Signed)
Pregnancy and Anemia ° °Anemia is a condition in which the concentration of red blood cells, or hemoglobin, in the blood is below normal. Hemoglobin is a substance in red blood cells that carries oxygen to the tissues of the body. Anemia results when enough oxygen does not reach these tissues. °Anemia is common during pregnancy because the woman's body needs more blood volume and blood cells to provide nutrition to the fetus. The fetus needs iron and folic acid as it is developing. Your body may not produce enough red blood cells because of this. Also, during pregnancy, the liquid part of the blood (plasma) increases by about 30-50%, and the red blood cells increase by only 20%. This lowers the concentration of the red blood cells and creates a natural anemia-like situation. °What are the causes? °The most common cause of anemia during pregnancy is not having enough iron in the body to make red blood cells (iron deficiency anemia). Other causes may include: °· Folic acid deficiency. °· Vitamin B12 deficiency. °· Certain prescription or over-the-counter medicines. °· Certain medical conditions or infections that destroy red blood cells. °· A low platelet count and bleeding caused by antibodies that go through the placenta to the fetus from the mother’s blood. °What are the signs or symptoms? °Mild anemia may not be noticeable. If it becomes severe, symptoms may include: °· Feeling tired (fatigue). °· Shortness of breath, especially during activity. °· Weakness. °· Fainting. °· Pale looking skin. °· Headaches. °· A fast or irregular heartbeat (palpitations). °· Dizziness. °How is this diagnosed? °This condition may be diagnosed based on: °· Your medical history and a physical exam. °· Blood tests. °How is this treated? °Treatment for anemia during pregnancy depends on the cause of the anemia. Treatment can include: °· Dietary changes. °· Supplements of iron, vitamin B12, or folic acid. °· A blood transfusion. This may  be needed if anemia is severe. °· Hospitalization. This may be needed if there is a lot of blood loss or severe anemia. °Follow these instructions at home: °· Follow recommendations from your dietitian or health care provider about changing your diet. °· Increase your vitamin C intake. This will help the stomach absorb more iron. Some foods that are high in vitamin C include: °? Oranges. °? Peppers. °? Tomatoes. °? Mangoes. °· Eat a diet rich in iron. This would include foods such as: °? Liver. °? Beef. °? Eggs. °? Whole grains. °? Spinach. °? Dried fruit. °· Take iron and vitamins as told by your health care provider. °· Eat green leafy vegetables. These are a good source of folic acid. °· Keep all follow-up visits as told by your health care provider. This is important. °Contact a health care provider if: °· You have frequent or lasting headaches. °· You look pale. °· You bruise easily. °Get help right away if: °· You have extreme weakness, shortness of breath, or chest pain. °· You become dizzy or have trouble concentrating. °· You have heavy vaginal bleeding. °· You develop a rash. °· You have bloody or black, tarry stools. °· You faint. °· You vomit up blood. °· You vomit repeatedly. °· You have abdominal pain. °· You have a fever. °· You are dehydrated. °Summary °· Anemia is a condition in which the concentration of red blood cells or hemoglobin in the blood is below normal. °· Anemia is common during pregnancy because the woman's body needs more blood volume and blood cells to provide nutrition to the fetus. °· The most   common cause of anemia during pregnancy is not having enough iron in the body to make red blood cells (iron deficiency anemia). °· Mild anemia may not be noticeable. If it becomes severe, symptoms may include feeling tired and weak. °This information is not intended to replace advice given to you by your health care provider. Make sure you discuss any questions you have with your health care  provider. °Document Released: 07/06/2000 Document Revised: 08/14/2016 Document Reviewed: 08/14/2016 °Elsevier Interactive Patient Education © 2019 [employer] ° °

## 2018-09-21 DIAGNOSIS — Z029 Encounter for administrative examinations, unspecified: Secondary | ICD-10-CM

## 2018-09-22 ENCOUNTER — Encounter (HOSPITAL_COMMUNITY): Payer: Self-pay | Admitting: *Deleted

## 2018-09-22 ENCOUNTER — Other Ambulatory Visit: Payer: Self-pay

## 2018-09-22 ENCOUNTER — Inpatient Hospital Stay (HOSPITAL_COMMUNITY)
Admission: AD | Admit: 2018-09-22 | Discharge: 2018-09-22 | Disposition: A | Discharge status: Home / Self Care | Payer: Medicaid Other | Attending: Obstetrics and Gynecology | Admitting: Obstetrics and Gynecology

## 2018-09-22 DIAGNOSIS — O99013 Anemia complicating pregnancy, third trimester: Secondary | ICD-10-CM | POA: Insufficient documentation

## 2018-09-22 DIAGNOSIS — I959 Hypotension, unspecified: Secondary | ICD-10-CM | POA: Diagnosis not present

## 2018-09-22 DIAGNOSIS — R42 Dizziness and giddiness: Secondary | ICD-10-CM | POA: Diagnosis present

## 2018-09-22 DIAGNOSIS — O26893 Other specified pregnancy related conditions, third trimester: Secondary | ICD-10-CM

## 2018-09-22 DIAGNOSIS — D649 Anemia, unspecified: Secondary | ICD-10-CM | POA: Insufficient documentation

## 2018-09-22 DIAGNOSIS — E162 Hypoglycemia, unspecified: Secondary | ICD-10-CM | POA: Insufficient documentation

## 2018-09-22 DIAGNOSIS — O99283 Endocrine, nutritional and metabolic diseases complicating pregnancy, third trimester: Secondary | ICD-10-CM | POA: Diagnosis not present

## 2018-09-22 DIAGNOSIS — D509 Iron deficiency anemia, unspecified: Secondary | ICD-10-CM

## 2018-09-22 DIAGNOSIS — O2653 Maternal hypotension syndrome, third trimester: Secondary | ICD-10-CM

## 2018-09-22 DIAGNOSIS — Z3402 Encounter for supervision of normal first pregnancy, second trimester: Secondary | ICD-10-CM

## 2018-09-22 DIAGNOSIS — Z3A35 35 weeks gestation of pregnancy: Secondary | ICD-10-CM

## 2018-09-22 DIAGNOSIS — Z87891 Personal history of nicotine dependence: Secondary | ICD-10-CM | POA: Diagnosis not present

## 2018-09-22 LAB — URINALYSIS, ROUTINE W REFLEX MICROSCOPIC
Bilirubin Urine: NEGATIVE
Glucose, UA: NEGATIVE mg/dL
Hgb urine dipstick: NEGATIVE
Ketones, ur: NEGATIVE mg/dL
Leukocytes,Ua: NEGATIVE
Nitrite: NEGATIVE
PH: 6 (ref 5.0–8.0)
Protein, ur: 30 mg/dL — AB
Specific Gravity, Urine: 1.017 (ref 1.005–1.030)

## 2018-09-22 LAB — CBC WITH DIFFERENTIAL/PLATELET
Abs Immature Granulocytes: 0.05 10*3/uL (ref 0.00–0.07)
Basophils Absolute: 0 10*3/uL (ref 0.0–0.1)
Basophils Relative: 0 %
Eosinophils Absolute: 0 10*3/uL (ref 0.0–0.5)
Eosinophils Relative: 0 %
HCT: 30.2 % — ABNORMAL LOW (ref 36.0–46.0)
Hemoglobin: 9.4 g/dL — ABNORMAL LOW (ref 12.0–15.0)
Immature Granulocytes: 1 %
LYMPHS ABS: 1.8 10*3/uL (ref 0.7–4.0)
Lymphocytes Relative: 25 %
MCH: 26.6 pg (ref 26.0–34.0)
MCHC: 31.1 g/dL (ref 30.0–36.0)
MCV: 85.6 fL (ref 80.0–100.0)
Monocytes Absolute: 0.8 10*3/uL (ref 0.1–1.0)
Monocytes Relative: 11 %
NRBC: 0 % (ref 0.0–0.2)
Neutro Abs: 4.4 10*3/uL (ref 1.7–7.7)
Neutrophils Relative %: 63 %
Platelets: 269 10*3/uL (ref 150–400)
RBC: 3.53 MIL/uL — ABNORMAL LOW (ref 3.87–5.11)
RDW: 14.5 % (ref 11.5–15.5)
WBC: 7.1 10*3/uL (ref 4.0–10.5)

## 2018-09-22 LAB — GLUCOSE, CAPILLARY: Glucose-Capillary: 52 mg/dL — ABNORMAL LOW (ref 70–99)

## 2018-09-22 MED ORDER — LACTATED RINGERS IV BOLUS
1000.0000 mL | Freq: Once | INTRAVENOUS | Status: AC
  Administered 2018-09-22: 1000 mL via INTRAVENOUS

## 2018-09-22 NOTE — MAU Provider Note (Signed)
History     CSN: [id number]  Arrival date and time: 09/22/18 1255   First Provider Initiated Contact with Patient 09/22/18 1332      Chief Complaint  Patient presents with  . Dizziness  . Nausea   HPI Ms. Natasha Martinez is a 23 y.o. G1P0 at [redacted]w[redacted]d who presents to MAU today with complaint of dizziness, lightheaded and nausea earlier today x 1 hour. She states now she is just feeling lightheaded. This happened to her a few weeks ago as well. She was started on iron supplements and has been taking them as directed. She denies complications with this pregnancy. She denies pain, contractions, vaginal bleeding or LOF. She reports normal fetal movement.    OB History    Gravida  1   Para      Term      Preterm      AB      Living        SAB      TAB      Ectopic      Multiple      Live Births              Past Medical History:  Diagnosis Date  . Asthma    hasn't used inhaler in years  . Migraine   . Seizures (HCC)    as a child, last time when 4 yrs old    Past Surgical History:  Procedure Laterality Date  . Wisdom tooth removal      No family history on file.  Social History   Tobacco Use  . Smoking status: Former Smoker    Last attempt to quit: 01/2018    Years since quitting: 0.6  . Smokeless tobacco: Never Used  Substance Use Topics  . Alcohol use: No  . Drug use: No    Allergies: No Known Allergies  Medications Prior to Admission  Medication Sig Dispense Refill Last Dose  . albuterol (PROVENTIL HFA;VENTOLIN HFA) 108 (90 BASE) MCG/ACT inhaler Inhale 2 puffs into the lungs 4 (four) times daily. 1 Inhaler 0 Taking  . docusate sodium (COLACE) 100 MG capsule Take 1 capsule (100 mg total) by mouth 2 (two) times daily. 10 capsule 0   . ferrous sulfate 325 (65 FE) MG tablet Take 1 tablet (325 mg total) by mouth every other day. 30 tablet 0 Taking  . Prenat w/o A-FeCbGl-DSS-FA-DHA (CITRANATAL 90 DHA) 90-1 & 300 MG MISC Take 1 tablet by mouth daily.  30 each 11 Taking    Review of Systems  Constitutional: Negative for fever.  Gastrointestinal: Positive for nausea. Negative for abdominal pain, constipation, diarrhea and vomiting.  Genitourinary: Negative for vaginal bleeding and vaginal discharge.  Neurological: Positive for dizziness and light-headedness. Negative for syncope.   Physical Exam   Blood pressure (!) 98/59, pulse 87, temperature 98 F (36.7 C), temperature source Oral, resp. rate 16, height 5\' 3"  (1.6 m), weight 69.9 kg, last menstrual period 01/20/2018, SpO2 99 %.  Physical Exam  Nursing note and vitals reviewed. Constitutional: She is oriented to person, place, and time. She appears well-developed and well-nourished. No distress.  HENT:  Head: Normocephalic and atraumatic.  Cardiovascular: Normal rate.  Respiratory: Effort normal.  GI: Soft. She exhibits no distension and no mass. There is no abdominal tenderness. There is no rebound and no guarding.  Neurological: She is alert and oriented to person, place, and time.  Skin: Skin is warm and dry. No erythema.  Psychiatric: She has a  normal mood and affect.   Results for orders placed or performed during the hospital encounter of 09/22/18 (from the past 24 hour(s))  CBC with Differential/Platelet     Status: Abnormal   Collection Time: 09/22/18  1:35 PM  Result Value Ref Range   WBC 7.1 4.0 - 10.5 K/uL   RBC 3.53 (L) 3.87 - 5.11 MIL/uL   Hemoglobin 9.4 (L) 12.0 - 15.0 g/dL   HCT 37.9 (L) 02.4 - 09.7 %   MCV 85.6 80.0 - 100.0 fL   MCH 26.6 26.0 - 34.0 pg   MCHC 31.1 30.0 - 36.0 g/dL   RDW 35.3 29.9 - 24.2 %   Platelets 269 150 - 400 K/uL   nRBC 0.0 0.0 - 0.2 %   Neutrophils Relative % 63 %   Neutro Abs 4.4 1.7 - 7.7 K/uL   Lymphocytes Relative 25 %   Lymphs Abs 1.8 0.7 - 4.0 K/uL   Monocytes Relative 11 %   Monocytes Absolute 0.8 0.1 - 1.0 K/uL   Eosinophils Relative 0 %   Eosinophils Absolute 0.0 0.0 - 0.5 K/uL   Basophils Relative 0 %   Basophils  Absolute 0.0 0.0 - 0.1 K/uL   Immature Granulocytes 1 %   Abs Immature Granulocytes 0.05 0.00 - 0.07 K/uL  Glucose, capillary     Status: Abnormal   Collection Time: 09/22/18  2:00 PM  Result Value Ref Range   Glucose-Capillary 52 (L) 70 - 99 mg/dL    Fetal Monitoring: Baseline: 140 bpm Variability: moderate Accelerations: 15 x 15 Decelerations: none Contractions: none   MAU Course  Procedures  MDM CBC, CBG, UA today Orthostatic VS obtained - normal 1 liter IV LR bolus given  Patient given gingerale  Assessment and Plan  A: [facility] at [redacted]w[redacted]d Maternal hypotension  Hypoglycemia Anemia in pregnancy  P:  Discharge home Continue Rx for iron supplement as previously prescribed Increased PO hydration encouraged Discussed diet to avoid hypoglycemia  Preterm labor precautions discussed Patient advised to follow-up with [facility] as scheduled for routine prenatal care  Patient may return to MAU as needed or if her condition were to change or worsen  Vonzella Nipple, PA-C 09/22/2018, 2:42 PM

## 2018-09-22 NOTE — MAU Note (Signed)
Pt reports earlier she was feeling dizzy and light-headed, felt nauseous. This was all about 1 hour ago. Currently just feels a little light-headed.

## 2018-09-22 NOTE — Discharge Instructions (Signed)
Anemia  Anemia is a condition in which you do not have enough red blood cells or hemoglobin. Hemoglobin is a substance in red blood cells that carries oxygen. When you do not have enough red blood cells or hemoglobin (are anemic), your body cannot get enough oxygen and your organs may not work properly. As a result, you may feel very tired or have other problems. What are the causes? Common causes of anemia include:  Excessive bleeding. Anemia can be caused by excessive bleeding inside or outside the body, including bleeding from the intestine or from periods in women.  Poor nutrition.  Long-lasting (chronic) kidney, thyroid, and liver disease.  Bone marrow disorders.  Cancer and treatments for cancer.  HIV (human immunodeficiency virus) and AIDS (acquired immunodeficiency syndrome).  Treatments for HIV and AIDS.  Spleen problems.  Blood disorders.  Infections, medicines, and autoimmune disorders that destroy red blood cells. What are the signs or symptoms? Symptoms of this condition include:  Minor weakness.  Dizziness.  Headache.  Feeling heartbeats that are irregular or faster than normal (palpitations).  Shortness of breath, especially with exercise.  Paleness.  Cold sensitivity.  Indigestion.  Nausea.  Difficulty sleeping.  Difficulty concentrating. Symptoms may occur suddenly or develop slowly. If your anemia is mild, you may not have symptoms. How is this diagnosed? This condition is diagnosed based on:  Blood tests.  Your medical history.  A physical exam.  Bone marrow biopsy. Your health care provider may also check your stool (feces) for blood and may do additional testing to look for the cause of your bleeding. You may also have other tests, including:  Imaging tests, such as a CT scan or MRI.  Endoscopy.  Colonoscopy. How is this treated? Treatment for this condition depends on the cause. If you continue to lose a lot of blood, you may  need to be treated at a hospital. Treatment may include:  Taking supplements of iron, vitamin M08, or folic acid.  Taking a hormone medicine (erythropoietin) that can help to stimulate red blood cell growth.  Having a blood transfusion. This may be needed if you lose a lot of blood.  Making changes to your diet.  Having surgery to remove your spleen. Follow these instructions at home:  Take over-the-counter and prescription medicines only as told by your health care provider.  Take supplements only as told by your health care provider.  Follow any diet instructions that you were given.  Keep all follow-up visits as told by your health care provider. This is important. Contact a health care provider if:  You develop new bleeding anywhere in the body. Get help right away if:  You are very weak.  You are short of breath.  You have pain in your abdomen or chest.  You are dizzy or feel faint.  You have trouble concentrating.  You have bloody or black, tarry stools.  You vomit repeatedly or you vomit up blood. Summary  Anemia is a condition in which you do not have enough red blood cells or enough of a substance in your red blood cells that carries oxygen (hemoglobin).  Symptoms may occur suddenly or develop slowly.  If your anemia is mild, you may not have symptoms.  This condition is diagnosed with blood tests as well as a medical history and physical exam. Other tests may be needed.  Treatment for this condition depends on the cause of the anemia. This information is not intended to replace advice given to you by  your health care provider. Make sure you discuss any questions you have with your health care provider. Document Released: 08/16/2004 Document Revised: 08/10/2016 Document Reviewed: 08/10/2016 Elsevier Interactive Patient Education  2019 [employer]. Hypotension As your heart beats, it forces blood through your body. This force is called blood pressure. If  you have hypotension, you have low blood pressure. When your blood pressure is too low, you may not get enough blood to your brain or other parts of your body. This may cause you to feel weak, light-headed, have a fast heartbeat, or even pass out (faint). Low blood pressure may be harmless, or it may cause serious problems. What are the causes?  Blood loss.  Not enough water in the body (dehydration).  Heart problems.  Hormone problems.  Pregnancy.  A very bad infection.  Not having enough of certain nutrients.  Very bad allergic reactions.  Certain medicines. What increases the risk?  Age. The risk increases as you get older.  Conditions that affect the heart or the brain and spinal cord (central nervous system).  Taking certain medicines.  Being pregnant. What are the signs or symptoms?  Feeling: ? Weak. ? Light-headed. ? Dizzy. ? Tired (fatigued).  Blurred vision.  Fast heartbeat.  Passing out, in very bad cases. How is this treated?  Changing your diet. This may involve eating more salt (sodium) or drinking more water.  Taking medicines to raise your blood pressure.  Changing how much you take (the dosage) of some of your medicines.  Wearing compression stockings. These stockings help to prevent blood clots and reduce swelling in your legs. In some cases, you may need to go to the hospital for:  Fluid replacement. This means you will receive fluids through an IV tube.  Blood replacement. This means you will receive donated blood through an IV tube (transfusion).  Treating an infection or heart problems, if this applies.  Monitoring. You may need to be monitored while medicines that you are taking wear off. Follow these instructions at home: Eating and drinking   Drink enough fluids to keep your pee (urine) pale yellow.  Eat a healthy diet. Follow instructions from your doctor about what you can eat or drink. A healthy diet includes: ? Fresh  fruits and vegetables. ? Whole grains. ? Low-fat (lean) meats. ? Low-fat dairy products.  Eat extra salt only as told. Do not add extra salt to your diet unless your doctor tells you to.  Eat small meals often.  Avoid standing up quickly after you eat. Medicines  Take over-the-counter and prescription medicines only as told by your doctor. ? Follow instructions from your doctor about changing how much you take of your medicines, if this applies. ? Do not stop or change any of your medicines on your own. General instructions   Wear compression stockings as told by your doctor.  Get up slowly from lying down or sitting.  Avoid hot showers and a lot of heat as told by your doctor.  Return to your normal activities as told by your doctor. Ask what activities are safe for you.  Do not use any products that contain nicotine or tobacco, such as cigarettes, e-cigarettes, and chewing tobacco. If you need help quitting, ask your doctor.  Keep all follow-up visits as told by your doctor. This is important. Contact a doctor if:  You throw up (vomit).  You have watery poop (diarrhea).  You have a fever for more than 2-3 days.  You feel more  thirsty than normal.  You feel weak and tired. Get help right away if:  You have chest pain.  You have a fast or uneven heartbeat.  You lose feeling (have numbness) in any part of your body.  You cannot move your arms or your legs.  You have trouble talking.  You get sweaty or feel light-headed.  You pass out.  You have trouble breathing.  You have trouble staying awake.  You feel mixed up (confused). Summary  Hypotension is also called low blood pressure. It is when the force of blood pumping through your arteries is too weak.  Hypotension may be harmless, or it may cause serious problems.  Treatment may include changing your diet and medicines, and wearing compression stockings.  In very bad cases, you may need to go to  the hospital. This information is not intended to replace advice given to you by your health care provider. Make sure you discuss any questions you have with your health care provider. Document Released: 10/03/2009 Document Revised: 01/02/2018 Document Reviewed: 01/02/2018 Elsevier Interactive Patient Education  [employer].

## 2018-09-30 ENCOUNTER — Other Ambulatory Visit (HOSPITAL_COMMUNITY)
Admission: RE | Admit: 2018-09-30 | Discharge: 2018-09-30 | Disposition: A | Discharge status: Home / Self Care | Payer: Medicaid Other | Source: Ambulatory Visit | Attending: Student | Admitting: Student

## 2018-09-30 ENCOUNTER — Other Ambulatory Visit: Payer: Self-pay

## 2018-09-30 ENCOUNTER — Ambulatory Visit (INDEPENDENT_AMBULATORY_CARE_PROVIDER_SITE_OTHER): Payer: Medicaid Other | Admitting: Student

## 2018-09-30 VITALS — BP 106/70 | HR 110 | Wt 157.3 lb

## 2018-09-30 DIAGNOSIS — Z3A36 36 weeks gestation of pregnancy: Secondary | ICD-10-CM

## 2018-09-30 DIAGNOSIS — Z3402 Encounter for supervision of normal first pregnancy, second trimester: Secondary | ICD-10-CM | POA: Diagnosis not present

## 2018-09-30 DIAGNOSIS — O99013 Anemia complicating pregnancy, third trimester: Secondary | ICD-10-CM

## 2018-09-30 DIAGNOSIS — O26843 Uterine size-date discrepancy, third trimester: Secondary | ICD-10-CM

## 2018-09-30 LAB — OB RESULTS CONSOLE GBS: GBS: NEGATIVE

## 2018-09-30 NOTE — Patient Instructions (Signed)

## 2018-09-30 NOTE — Progress Notes (Signed)
   PRENATAL VISIT NOTE  Subjective:  Natasha Martinez is a 23 y.o. G1P0 at [redacted]w[redacted]d being seen today for ongoing prenatal care.  She is currently monitored for the following issues for this low-risk pregnancy and has Chronic migraine w/o aura w/o status migrainosus, not intractable; History of seizures as a child; Encounter for supervision of normal first pregnancy in second trimester; Asthma in adult, mild intermittent, uncomplicated; Anemia affecting pregnancy in third trimester; and Uterine size date discrepancy on their problem list.  Patient reports no complaints. Feels lots of fetal movements. .  Contractions: Not present. Vag. Bleeding: None.  Movement: Present. Denies leaking of fluid.   The following portions of the patient's history were reviewed and updated as appropriate: allergies, current medications, past family history, past medical history, past social history, past surgical history and problem list.   Objective:   Vitals:   09/30/18 0939  BP: 106/70  Pulse: (!) 110  Weight: 157 lb 4.8 oz (71.4 kg)    Fetal Status: Fetal Heart Rate (bpm): 154 Fundal Height: 34 cm Movement: Present  Presentation: Vertex  General:  Alert, oriented and cooperative. Patient is in no acute distress.  Skin: Skin is warm and dry. No rash noted.   Cardiovascular: Normal heart rate noted  Respiratory: Normal respiratory effort, no problems with respiration noted  Abdomen: Soft, gravid, appropriate for gestational age.  Pain/Pressure: Present     Pelvic: Cervical exam deferred        Extremities: Normal range of motion.  Edema: None  Mental Status: Normal mood and affect. Normal behavior. Normal judgment and thought content.   Assessment and Plan:  Pregnancy: G1P0 at [redacted]w[redacted]d 1. Encounter for supervision of normal first pregnancy in second trimester  - Culture, beta strep (group b only) - GC/Chlamydia probe amp ()not at [facility]  2. Anemia affecting pregnancy in third trimester -Taking iron  every other day; will recheck CBC today.  - CBC  3. Uterine size-date discrepancy in third trimester -Patient plans to attend growth [place] on Thursday; feels that baby "balls up" at appts which is why she measured smaller two weeks ago.   Preterm labor symptoms and general obstetric precautions including but not limited to vaginal bleeding, contractions, leaking of fluid and fetal movement were reviewed in detail with the patient. Please refer to After Visit Summary for other counseling recommendations.   No follow-ups on file.  Future Appointments  Date Time Provider Department Center  10/02/2018  8:45 AM [facility] [place] 2 [facility]  10/14/2018  9:15 AM Marylene Land, CNM [facility]  10/21/2018  9:15 AM Marylene Land, CNM [facility]  10/28/2018  9:15 AM Marylene Land, CNM [facility]  11/04/2018  9:15 AM Crisoforo Oxford, Charlesetta Garibaldi, CNM [facility]    Marylene Land, [place]

## 2018-10-01 LAB — CBC
Hematocrit: 27.7 % — ABNORMAL LOW (ref 34.0–46.6)
Hemoglobin: 9.2 g/dL — ABNORMAL LOW (ref 11.1–15.9)
MCH: 27.2 pg (ref 26.6–33.0)
MCHC: 33.2 g/dL (ref 31.5–35.7)
MCV: 82 fL (ref 79–97)
Platelets: 288 10*3/uL (ref 150–450)
RBC: 3.38 x10E6/uL — ABNORMAL LOW (ref 3.77–5.28)
RDW: 14.5 % (ref 11.7–15.4)
WBC: 6.3 10*3/uL (ref 3.4–10.8)

## 2018-10-01 LAB — GC/CHLAMYDIA PROBE AMP (~~LOC~~) NOT AT ARMC
CHLAMYDIA, DNA PROBE: NEGATIVE
Neisseria Gonorrhea: NEGATIVE

## 2018-10-02 ENCOUNTER — Other Ambulatory Visit: Payer: Self-pay

## 2018-10-02 ENCOUNTER — Ambulatory Visit (HOSPITAL_COMMUNITY)
Admission: RE | Admit: 2018-10-02 | Discharge: 2018-10-02 | Disposition: A | Discharge status: Home / Self Care | Payer: Medicaid Other | Source: Ambulatory Visit | Attending: Student | Admitting: Student

## 2018-10-02 DIAGNOSIS — O9989 Other specified diseases and conditions complicating pregnancy, childbirth and the puerperium: Secondary | ICD-10-CM

## 2018-10-02 DIAGNOSIS — G40909 Epilepsy, unspecified, not intractable, without status epilepticus: Secondary | ICD-10-CM

## 2018-10-02 DIAGNOSIS — Z3402 Encounter for supervision of normal first pregnancy, second trimester: Secondary | ICD-10-CM

## 2018-10-02 DIAGNOSIS — O99353 Diseases of the nervous system complicating pregnancy, third trimester: Secondary | ICD-10-CM

## 2018-10-02 DIAGNOSIS — J45909 Unspecified asthma, uncomplicated: Secondary | ICD-10-CM

## 2018-10-02 DIAGNOSIS — O26843 Uterine size-date discrepancy, third trimester: Secondary | ICD-10-CM

## 2018-10-02 DIAGNOSIS — Z3A36 36 weeks gestation of pregnancy: Secondary | ICD-10-CM

## 2018-10-02 DIAGNOSIS — Z362 Encounter for other antenatal screening follow-up: Secondary | ICD-10-CM

## 2018-10-03 LAB — CULTURE, BETA STREP (GROUP B ONLY): Strep Gp B Culture: NEGATIVE

## 2018-10-06 ENCOUNTER — Telehealth: Payer: Self-pay | Admitting: Family Medicine

## 2018-10-06 NOTE — Telephone Encounter (Signed)
Attempted to call patient with restriction information for the office due to the coronavirus. No answer, left detailed message with the restrictions and to give the office a call there are any questions or concerns.

## 2018-10-07 ENCOUNTER — Ambulatory Visit (INDEPENDENT_AMBULATORY_CARE_PROVIDER_SITE_OTHER): Payer: Medicaid Other | Admitting: Student

## 2018-10-07 ENCOUNTER — Other Ambulatory Visit: Payer: Self-pay

## 2018-10-07 VITALS — BP 91/68 | HR 103 | Wt 156.9 lb

## 2018-10-07 DIAGNOSIS — Z3A37 37 weeks gestation of pregnancy: Secondary | ICD-10-CM

## 2018-10-07 DIAGNOSIS — Z3402 Encounter for supervision of normal first pregnancy, second trimester: Secondary | ICD-10-CM

## 2018-10-07 NOTE — Progress Notes (Signed)
   PRENATAL VISIT NOTE  Subjective:  Natasha Martinez is a 23 y.o. G1P0 at [redacted]w[redacted]d being seen today for ongoing prenatal care.  She is currently monitored for the following issues for this low-risk pregnancy and has Chronic migraine w/o aura w/o status migrainosus, not intractable; History of seizures as a child; Encounter for supervision of normal first pregnancy in second trimester; Asthma in adult, mild intermittent, uncomplicated; Anemia affecting pregnancy in third trimester; and Uterine size date discrepancy on their problem list.  Patient reports a few contractions; would like her cervix checked.   Contractions: Irritability. Vag. Bleeding: None.  Movement: Present. Denies leaking of fluid.   The following portions of the patient's history were reviewed and updated as appropriate: allergies, current medications, past family history, past medical history, past social history, past surgical history and problem list.   Objective:   Vitals:   10/07/18 1008  BP: 91/68  Pulse: (!) 103  Weight: 156 lb 14.4 oz (71.2 kg)    Fetal Status: Fetal Heart Rate (bpm): 155   Movement: Present     General:  Alert, oriented and cooperative. Patient is in no acute distress.  Skin: Skin is warm and dry. No rash noted.   Cardiovascular: Normal heart rate noted  Respiratory: Normal respiratory effort, no problems with respiration noted  Abdomen: Soft, gravid, appropriate for gestational age.  Pain/Pressure: Present     Pelvic: Cervical exam deferred        Extremities: Normal range of motion.  Edema: None  Mental Status: Normal mood and affect. Normal behavior. Normal judgment and thought content.   Assessment and Plan:  Pregnancy: G1P0 at [redacted]w[redacted]d 1. Encounter for supervision of normal first pregnancy in second trimester    2. Patient FH measuring small, last week growth scan was EFW in 24%; 5 lbs 8 oz. Consider repeat in 4 weeks if not delivered or if FH drops off significantly, although MFM did not make  any follow up recommendations in report.   Term labor symptoms and general obstetric precautions including but not limited to vaginal bleeding, contractions, leaking of fluid and fetal movement were reviewed in detail with the patient. Please refer to After Visit Summary for other counseling recommendations.   Return in about 1 week (around 10/14/2018).  Future Appointments  Date Time Provider Department Center  10/14/2018  9:15 AM Marylene Land, CNM [facility]  10/21/2018  9:15 AM Marylene Land, CNM [facility]OC  10/28/2018  9:15 AM Marylene Land, CNM [facility]  11/04/2018  9:15 AM Crisoforo Oxford, Charlesetta Garibaldi, CNM [facility] WOC    Marylene Land, [place]

## 2018-10-07 NOTE — Patient Instructions (Signed)

## 2018-10-13 NOTE — Telephone Encounter (Signed)
Called the patient to inform Cov19 restrictions. Informed the patient of the restriction on visitors and our method of signing in via the voicemail.

## 2018-10-14 ENCOUNTER — Telehealth: Payer: Self-pay | Admitting: Lactation Services

## 2018-10-14 ENCOUNTER — Ambulatory Visit (INDEPENDENT_AMBULATORY_CARE_PROVIDER_SITE_OTHER): Payer: Medicaid Other | Admitting: Student

## 2018-10-14 ENCOUNTER — Other Ambulatory Visit: Payer: Self-pay

## 2018-10-14 VITALS — BP 111/70 | HR 101 | Temp 98.1°F | Wt 160.6 lb

## 2018-10-14 DIAGNOSIS — Z3A38 38 weeks gestation of pregnancy: Secondary | ICD-10-CM

## 2018-10-14 DIAGNOSIS — O26843 Uterine size-date discrepancy, third trimester: Secondary | ICD-10-CM

## 2018-10-14 DIAGNOSIS — Z3403 Encounter for supervision of normal first pregnancy, third trimester: Secondary | ICD-10-CM

## 2018-10-14 NOTE — Telephone Encounter (Signed)
Met with mom during her prenatal OB visit. Mom plans to BF infant. Enc mom to BF as formula is in short supply due to [facility] Virus.   Reviewed supply and demand and milk coming to volume. Enc mom to feed infant on demand and offer both breasts with each feeding. Enc mom to use both breasts with each feeding. Enc mom to bring breast feeding pillow to the hospital with her if she has one.   Mom is active with WIC, informed her they try to see mom's post delivery to get their [place] started. Mom does not have a pump at home, informed her she will get a manual pump in the hospital. Informed mom that [facility] may provide an electric pump depending on Medical need and [facility] package chosen.   Mom reports she has no further questions at this time. Enc mom to ask for assistance from nurses and LC's while in the hospital.

## 2018-10-14 NOTE — Patient Instructions (Signed)

## 2018-10-14 NOTE — Progress Notes (Signed)
   PRENATAL VISIT NOTE  Subjective:  Natasha Martinez is a 23 y.o. G1P0 at [redacted]w[redacted]d being seen today for ongoing prenatal care.  She is currently monitored for the following issues for this low-risk pregnancy and has Chronic migraine w/o aura w/o status migrainosus, not intractable; History of seizures as a child; Encounter for supervision of normal first pregnancy in second trimester; Asthma in adult, mild intermittent, uncomplicated; Anemia affecting pregnancy in third trimester; and Uterine size date discrepancy on their problem list.  Patient reports no complaints.  Contractions: Irritability. Vag. Bleeding: None.  Movement: Present. Denies leaking of fluid.   The following portions of the patient's history were reviewed and updated as appropriate: allergies, current medications, past family history, past medical history, past social history, past surgical history and problem list.   Objective:   Vitals:   10/14/18 0933  BP: 111/70  Pulse: (!) 101  Temp: 98.1 F (36.7 C)  Weight: 160 lb 9.6 oz (72.8 kg)    Fetal Status: Fetal Heart Rate (bpm): 150 Fundal Height: 36 cm Movement: Present     General:  Alert, oriented and cooperative. Patient is in no acute distress.  Skin: Skin is warm and dry. No rash noted.   Cardiovascular: Normal heart rate noted  Respiratory: Normal respiratory effort, no problems with respiration noted  Abdomen: Soft, gravid, appropriate for gestational age.  Pain/Pressure: Present     Pelvic: Cervical exam deferred        Extremities: Normal range of motion.  Edema: None  Mental Status: Normal mood and affect. Normal behavior. Normal judgment and thought content.   Assessment and Plan:  Pregnancy: G1P0 at [redacted]w[redacted]d 1. Uterine size-date discrepancy in third trimester -now measuring appriopriately -Patient doing well; reviewed S/S of labor.  -Explained that we would schedule induction at 41 weeks.   Term labor symptoms and general obstetric precautions including  but not limited to vaginal bleeding, contractions, leaking of fluid and fetal movement were reviewed in detail with the patient. Please refer to After Visit Summary for other counseling recommendations.   Return in about 1 week (around 10/21/2018), or LROB.  Future Appointments  Date Time Provider Department Center  10/21/2018  9:15 AM Marylene Land, CNM [facility]  10/28/2018  9:15 AM Marylene Land, CNM [facility] WOC  11/04/2018  9:15 AM Crisoforo Oxford, Charlesetta Garibaldi, CNM [facility] WOC    Marylene Land, [place]

## 2018-10-21 ENCOUNTER — Ambulatory Visit (INDEPENDENT_AMBULATORY_CARE_PROVIDER_SITE_OTHER): Payer: Medicaid Other | Admitting: Student

## 2018-10-21 ENCOUNTER — Other Ambulatory Visit: Payer: Self-pay | Admitting: Student

## 2018-10-21 ENCOUNTER — Other Ambulatory Visit: Payer: Self-pay

## 2018-10-21 VITALS — BP 107/75 | HR 112 | Temp 97.4°F | Wt 162.5 lb

## 2018-10-21 DIAGNOSIS — Z3A39 39 weeks gestation of pregnancy: Secondary | ICD-10-CM

## 2018-10-21 DIAGNOSIS — Z3402 Encounter for supervision of normal first pregnancy, second trimester: Secondary | ICD-10-CM

## 2018-10-21 NOTE — Progress Notes (Signed)
   PRENATAL VISIT NOTE  Subjective:  Natasha Martinez is a 23 y.o. G1P0 at [redacted]w[redacted]d being seen today for ongoing prenatal care.  She is currently monitored for the following issues for this low-risk pregnancy and has Chronic migraine w/o aura w/o status migrainosus, not intractable; History of seizures as a child; Encounter for supervision of normal first pregnancy in second trimester; Asthma in adult, mild intermittent, uncomplicated; Anemia affecting pregnancy in third trimester; and Uterine size date discrepancy on their problem list.  Patient reports no complaints.  Contractions: Not present. Vag. Bleeding: None.  Movement: Present. Denies leaking of fluid.   The following portions of the patient's history were reviewed and updated as appropriate: allergies, current medications, past family history, past medical history, past social history, past surgical history and problem list.   Objective:   Vitals:   10/21/18 0929  BP: 107/75  Pulse: (!) 112  Temp: (!) 97.4 F (36.3 C)  Weight: 162 lb 8 oz (73.7 kg)    Fetal Status: Fetal Heart Rate (bpm): 147 Fundal Height: 37 cm Movement: Present     General:  Alert, oriented and cooperative. Patient is in no acute distress.  Skin: Skin is warm and dry. No rash noted.   Cardiovascular: Normal heart rate noted  Respiratory: Normal respiratory effort, no problems with respiration noted  Abdomen: Soft, gravid, appropriate for gestational age.  Pain/Pressure: Present     Pelvic: Cervical exam deferred        Extremities: Normal range of motion.  Edema: None  Mental Status: Normal mood and affect. Normal behavior. Normal judgment and thought content.   Assessment and Plan:  Pregnancy: G1P0 at [redacted]w[redacted]d  1. Supervision of low risk pregnancy -FH now appropriate; measuring 37 cm at 39 but has been steadily growing.  -strong fetal movements -discussed IOL; explained about FB and labor induction process. Can discuss at visit next week; as well as  cervical exam to determine if appropriate for FB.  -IOL orders to be placed today and induction scheduled.   Term labor symptoms and general obstetric precautions including but not limited to vaginal bleeding, contractions, leaking of fluid and fetal movement were reviewed in detail with the patient. Please refer to After Visit Summary for other counseling recommendations.   Return in about 1 week (around 10/28/2018), or LROB and NST/BPP for postdates.  Future Appointments  Date Time Provider Department Center  10/27/2018 11:15 AM [facility] NST [facility]  10/27/2018  2:15 PM Armando Reichert, CNM [facility] WOC  11/03/2018  7:30 AM [facility] ROOM [facility] None    Marylene Land, CNM

## 2018-10-21 NOTE — Patient Instructions (Signed)
Labor Induction    Labor induction is when steps are taken to cause a pregnant woman to begin the labor process. Most women go into labor on their own between 37 weeks and 42 weeks of pregnancy. When this does not happen or when there is a medical need for labor to begin, steps may be taken to induce labor. Labor induction causes a pregnant woman's uterus to contract. It also causes the cervix to soften (ripen), open (dilate), and thin out (efface). Usually, labor is not induced before 39 weeks of pregnancy unless there is a medical reason to do so. Your health care provider will determine if labor induction is needed.  Before inducing labor, your health care provider will consider a number of factors, including:  · Your medical condition and your baby's.  · How many weeks along you are in your pregnancy.  · How mature your baby's lungs are.  · The condition of your cervix.  · The position of your baby.  · The size of your birth canal.  What are some reasons for labor induction?  Labor may be induced if:  · Your health or your baby's health is at risk.  · Your pregnancy is overdue by 1 week or more.  · Your water breaks but labor does not start on its own.  · There is a low amount of amniotic fluid around your baby.  You may also choose (elect) to have labor induced at a certain time. Generally, elective labor induction is done no earlier than 39 weeks of pregnancy.  What methods are used for labor induction?  Methods used for labor induction include:  · Prostaglandin medicine. This medicine starts contractions and causes the cervix to dilate and ripen. It can be taken by mouth (orally) or by being inserted into the vagina (suppository).  · Inserting a small, thin tube (catheter) with a balloon into the vagina and then expanding the balloon with water to dilate the cervix.  · Stripping the membranes. In this method, your health care provider gently separates amniotic sac tissue from the cervix. This causes the  cervix to stretch, which in turn causes the release of a hormone called progesterone. The hormone causes the uterus to contract. This procedure is often done during an office visit, after which you will be sent home to wait for contractions to begin.  · Breaking the water. In this method, your health care provider uses a small instrument to make a small hole in the amniotic sac. This eventually causes the amniotic sac to break. Contractions should begin after a few hours.  · Medicine to trigger or strengthen contractions. This medicine is given through an IV that is inserted into a vein in your arm.  Except for membrane stripping, which can be done in a clinic, labor induction is done in the hospital so that you and your baby can be carefully monitored.  How long does it take for labor to be induced?  The length of time it takes to induce labor depends on how ready your body is for labor. Some inductions can take up to 2-3 days, while others may take less than a day. Induction may take longer if:  · You are induced early in your pregnancy.  · It is your first pregnancy.  · Your cervix is not ready.  What are some risks associated with labor induction?  Some risks associated with labor induction include:  · Changes in fetal heart rate, such as being too   high, too low, or irregular (erratic).  · Failed induction.  · Infection in the mother or the baby.  · Increased risk of having a cesarean delivery.  · Fetal death.  · Breaking off (abruption) of the placenta from the uterus (rare).  · Rupture of the uterus (very rare).  When induction is needed for medical reasons, the benefits of induction generally outweigh the risks.  What are some reasons for not inducing labor?  Labor induction should not be done if:  · Your baby does not tolerate contractions.  · You have had previous surgeries on your uterus, such as a myomectomy, removal of fibroids, or a vertical scar from a previous cesarean delivery.  · Your placenta lies  very low in your uterus and blocks the opening of the cervix (placenta previa).  · Your baby is not in a head-down position.  · The umbilical cord drops down into the birth canal in front of the baby.  · There are unusual circumstances, such as the baby being very early (premature).  · You have had more than 2 previous cesarean deliveries.  Summary  · Labor induction is when steps are taken to cause a pregnant woman to begin the labor process.  · Labor induction causes a pregnant woman's uterus to contract. It also causes the cervix to ripen, dilate, and efface.  · Labor is not induced before 39 weeks of pregnancy unless there is a medical reason to do so.  · When induction is needed for medical reasons, the benefits of induction generally outweigh the risks.  This information is not intended to replace advice given to you by your health care provider. Make sure you discuss any questions you have with your health care provider.  Document Released: 11/28/2006 Document Revised: 08/22/2016 Document Reviewed: 08/22/2016  Elsevier Interactive Patient Education © 2019 [employer]

## 2018-10-22 ENCOUNTER — Encounter: Payer: Self-pay | Admitting: *Deleted

## 2018-10-27 ENCOUNTER — Encounter: Payer: Self-pay | Admitting: Advanced Practice Midwife

## 2018-10-27 ENCOUNTER — Other Ambulatory Visit: Payer: Self-pay

## 2018-10-27 ENCOUNTER — Ambulatory Visit (INDEPENDENT_AMBULATORY_CARE_PROVIDER_SITE_OTHER): Payer: Medicaid Other | Admitting: Advanced Practice Midwife

## 2018-10-27 ENCOUNTER — Ambulatory Visit: Payer: Self-pay

## 2018-10-27 ENCOUNTER — Ambulatory Visit (INDEPENDENT_AMBULATORY_CARE_PROVIDER_SITE_OTHER): Payer: Medicaid Other | Admitting: *Deleted

## 2018-10-27 ENCOUNTER — Telehealth (HOSPITAL_COMMUNITY): Payer: Self-pay | Admitting: *Deleted

## 2018-10-27 VITALS — BP 109/71 | HR 99 | Temp 97.9°F | Wt 163.1 lb

## 2018-10-27 DIAGNOSIS — O480 Post-term pregnancy: Secondary | ICD-10-CM

## 2018-10-27 DIAGNOSIS — Z3403 Encounter for supervision of normal first pregnancy, third trimester: Secondary | ICD-10-CM

## 2018-10-27 DIAGNOSIS — Z3A40 40 weeks gestation of pregnancy: Secondary | ICD-10-CM

## 2018-10-27 NOTE — Telephone Encounter (Signed)
Preadmission screen  

## 2018-10-27 NOTE — Patient Instructions (Signed)
Social Distancing FAQ: How It Helps Prevent COVID-19 (Coronavirus) and Steps We Can Take to Protect Ourselves There are many things we can do to prevent the spread of COVID-19 (coronavirus): washing our hands, coughing into our elbows, avoiding touching our faces, staying home if we're feeling sick and social distancing. But what is social distancing? These frequently asked questions explain how to practice social distancing in order to protect yourself, your loved ones and your community. General Information About Social Distancing Q: What is social distancing? A: Social distancing is the practice of purposefully reducing close contact between people. According to the CDC, social distancing means: . Remaining out of "congregate settings" as much as possible. . Avoiding mass gatherings. . Maintaining distance of about 6 feet from others when possible. Q: Why is social distancing important? A: Social distancing is crucial for preventing the spread of contagious illnesses such as COVID-19 (coronavirus). COVID-19 can spread through coughing, sneezing and close contact. By minimizing the amount of close contact we have with others, we reduce our chances of catching the virus and spreading it to our loved ones and within our community. Q: Who is social distancing important for? A: Social distancing is important for all of us, but those of us who are at higher risk of serious complications caused by COVID-19 should be especially cautious about social distancing. People who are at high risk of complications include: . Older adults. . People who have serious chronic medical conditions like heart disease, diabetes and lung disease. Q: What is "flattening the curve"? What does it have to do with social distancing? A: "Flattening the curve" refers to reducing the number of people who are sick at one time. If there are high surges in the number of COVID-19 cases all at once, health care systems and resources  could potentially become overwhelmed. Efforts that help stop COVID-19 from spreading rapidly - like social distancing - help keep the number of people who are sick at one time as low as possible. Q: When should I start practicing social distancing? A: The best time to begin social distancing is before an illness like COVID-19 becomes widespread throughout your community. Each community's situation is unique, so it is important to follow the guidance of local government, health departments and health care providers. Currently in [place], gatherings of more than 10 people are banned. For the latest information on COVID-19 policies in [place], visit the [place] Department of Health and Human Services website and view news releases and executive orders. The advice below is applicable if you are symptom-free and have reasonable confidence that you have not had exposure to COVID-19. Always follow the guidance of local government, health departments and your health care providers. Visiting Public Spaces Q: How can I practice social distancing in the workplace? A: When possible, keeping about 6 feet of distance between yourself and others is key. It's also important to practice other preventative measures such as washing hands, avoiding touching your face, coughing into your elbow and staying home if you feel sick. Depending on your job and your community's situation, working from home may be an option. Always follow local guidance. Q: How can my child practice social distancing at school or at college?  A: Many schools and universities in the [place] have postponed in-person classes; currently in [place], K-12 schools have been closed until May 15 (this is subject to change). It's important to follow local guidance, as each community's situation is unique. It's important for people   of all ages to follow preventative measures, including staying about 6 feet away from others, avoiding  touching your face, washing your hands and coughing into your elbow. Q: Should I be concerned about going to the grocery store? A: In any place where large numbers of people gather, there is potential risk for disease transmission. When you visit the grocery store, keep about 6 feet between yourself and others and use prevention techniques like avoiding touching your face and washing your hands. If possible, visit the store at times when there are likely to be fewer people shopping. Q: Should I take public transportation? A: If you have the option, driving yourself, walking to work or working from home can help reduce the number of people who are using public transportation, which benefits you and your community. In any of these situations, it's very important to keep distance between yourself and others in addition to practicing other preventative measures. Q: Should I stop visiting restaurants and bars? A: Currently in [place], restaurants and bars have been closed until further notice. Always follow local guidance. Avoiding public places as much as possible helps prevent diseases from spreading. If dining out is a non-essential activity, then it is generally in the best interest of you and your loved ones to avoid it. Q: Can I still go to the gym? A: Currently in [place], gyms have been closed until further notice. Always follow local guidance. Alternatives could include exercising in your home or yard or walking through your neighborhood. If you do go to the gym, wipe down and sanitize your equipment, keep distance between yourself and others, avoid touching other people and practice other preventative techniques.  Q: What about events and places where many people gather, such as concerts, festivals, sporting events and churches? A: Risk of disease transmission is much higher in large groups of people. Effective March 25 at 5 PM in [place], meetings of over 10 people are prohibited  in order to prevent COVID-19 from spreading rapidly. Avoiding these gatherings are generally in the best interest of protecting yourself, your loved ones and your community. Always follow local guidance. Meeting with Others Q: Should I stop visiting my elderly relatives and friends?  A: Older adults are at high risk of serious complications from COVID-19. Limiting their exposure as much as possible to those who may be sick or who may be carrying the disease is crucial. This is a great opportunity to try other methods of connecting, such as over the phone or through a video chat. Q: What about social distancing with other people in my household? A: Avoiding close contact within a household is almost impossible, and social distancing is mainly focused on large groups. However, if someone in your household is sick, it's important to minimize close contact with them as much as is reasonable. Q: Should I stop meeting up with 1 or 2 friends? Should I stop dating? A: While meeting up with another person who also is symptom-free may be alright in some situations, keep in mind that risk of disease transmission is higher in public places. Now may be a good time to consider other methods of connecting with others, such as over the phone or through a video chat. Q: Can I have a small group of my extended family and/or friends over to my house? A: If possible, it's best to postpone these kinds of gatherings and look for alternative ways to connect. Avoiding non-essential gatherings is important for preventing the spread   of disease. Q: Should my family and friends cancel big gathering events like weddings and birthday parties? A: Effective March 25 at 5 PM in [place], meetings of over 10 people are prohibited in order to prevent COVID-19 from spreading rapidly. Always follow local guidance. While it's difficult to postpone important events like these, it's also very important to protect our loved ones -  especially our loved ones who are most vulnerable. It may be best to postpone or alter your plans.  Q: If I'm avoiding in-person gatherings, how can I stay connected to others? A: There are many ways you can connect with friends: phone calls, text messages, emails and video chats are all great virtual options. While physical social distancing is important for our health, so is social interaction - trying alternative ways to stay connected is a good way to take care of your emotional health. If You Are Experiencing Symptoms Q: How should I approach social distancing if I start to feel sick? A: If you begin to experience symptoms, it's important to stay home and distance yourself from others. Always follow the guidance of your health care providers and local government. Q: Can I have visitors while I am in quarantine? A: Having visitors should be avoided as much as possible. Quarantining is an important way to protect your loved ones and community from picking up the disease; visitors are at high risk of catching the illness from you. Q: Can I go outside in my yard if I am in quarantine? A: Depending on where you live and your community's situation, going outside and getting some fresh air in your yard may be safe. Always follow the guidance of your health care providers and local government.   

## 2018-10-27 NOTE — Progress Notes (Signed)
IOL scheduled 4/13 @ 0730.   Pt informed that the ultrasound is considered a limited OB ultrasound and is not intended to be a complete ultrasound exam.  Patient also informed that the ultrasound is not being completed with the intent of assessing for fetal or placental anomalies or any pelvic abnormalities.  Explained that the purpose of today's ultrasound is to assess for presentation, BPP and amniotic fluid volume.  Patient acknowledges the purpose of the exam and the limitations of the study.

## 2018-10-27 NOTE — Progress Notes (Signed)
   PRENATAL VISIT NOTE  Subjective:  Natasha Martinez is a 23 y.o. G1P0 at [redacted]w[redacted]d being seen today for ongoing prenatal care.  She is currently monitored for the following issues for this low-risk pregnancy and has Chronic migraine w/o aura w/o status migrainosus, not intractable; History of seizures as a child; Encounter for supervision of normal first pregnancy in second trimester; Asthma in adult, mild intermittent, uncomplicated; Anemia affecting pregnancy in third trimester; and Uterine size date discrepancy on their problem list.  Patient reports no complaints.  Contractions: Irregular. Vag. Bleeding: None.  Movement: Present. Denies leaking of fluid.   Has had contractions off and on for about 3 days. IOL scheduled already.    The following portions of the patient's history were reviewed and updated as appropriate: allergies, current medications, past family history, past medical history, past social history, past surgical history and problem list.   Objective:   Vitals:   10/27/18 1149  BP: 109/71  Pulse: 99  Temp: 97.9 F (36.6 C)  Weight: 163 lb 1.6 oz (74 kg)    Fetal Status: Fetal Heart Rate (bpm): 147 Fundal Height: 38 cm Movement: Present  Presentation: Vertex  General:  Alert, oriented and cooperative. Patient is in no acute distress.  Skin: Skin is warm and dry. No rash noted.   Cardiovascular: Normal heart rate noted  Respiratory: Normal respiratory effort, no problems with respiration noted  Abdomen: Soft, gravid, appropriate for gestational age.  Pain/Pressure: Present     Pelvic: Cervical exam performed Dilation: 1 Effacement (%): 50 Station: -1  Extremities: Normal range of motion.  Edema: None  Mental Status: Normal mood and affect. Normal behavior. Normal judgment and thought content.   NST:  Baseline: 135 Variability: moderate Accels: 15x15 Decels: none Toco: irregular   Assessment and Plan:  Pregnancy: G1P0 at [redacted]w[redacted]d 1. Encounter for supervision of  normal first pregnancy in third trimester - NST and post-dates testing today  - IOL scheduled for 11/03/2018    Term labor symptoms and general obstetric precautions including but not limited to vaginal bleeding, contractions, leaking of fluid and fetal movement were reviewed in detail with the patient. Please refer to After Visit Summary for other counseling recommendations.   Return for 6 weeks for PP visit .  Future Appointments  Date Time Provider Department Center  10/27/2018  2:15 PM Eugenio Hoes [facility] WOC  11/03/2018  7:30 AM [facility] SCHED ROOM [facility] None   Thressa Sheller DNP, CNM  10/27/18  11:56 AM

## 2018-10-28 ENCOUNTER — Encounter: Payer: Self-pay | Admitting: Student

## 2018-10-30 ENCOUNTER — Encounter (HOSPITAL_COMMUNITY): Payer: Self-pay | Admitting: *Deleted

## 2018-10-30 ENCOUNTER — Inpatient Hospital Stay (HOSPITAL_COMMUNITY)
Admission: AD | Admit: 2018-10-30 | Discharge: 2018-10-30 | Disposition: A | Discharge status: Home / Self Care | Payer: Medicaid Other | Source: Home / Self Care | Attending: Obstetrics & Gynecology | Admitting: Obstetrics & Gynecology

## 2018-10-30 ENCOUNTER — Other Ambulatory Visit: Payer: Self-pay

## 2018-10-30 DIAGNOSIS — Z3A40 40 weeks gestation of pregnancy: Secondary | ICD-10-CM

## 2018-10-30 DIAGNOSIS — O479 False labor, unspecified: Secondary | ICD-10-CM

## 2018-10-30 DIAGNOSIS — O471 False labor at or after 37 completed weeks of gestation: Secondary | ICD-10-CM

## 2018-10-30 DIAGNOSIS — Z3402 Encounter for supervision of normal first pregnancy, second trimester: Secondary | ICD-10-CM

## 2018-10-30 NOTE — MAU Note (Signed)
Presents with c/o ctxs since last night, states became more regular @ 0630 this morning.  Denies VB or LOF.  Reports +FM.

## 2018-10-30 NOTE — Discharge Instructions (Signed)

## 2018-10-30 NOTE — Progress Notes (Signed)
I have communicated with Fabian November, CNM and reviewed vital signs:  Vitals:   10/30/18 0821 10/30/18 0933  BP:  112/77  Pulse:  80  Resp:  19  Temp:  98.2 F (36.8 C)  SpO2: 98% 99%    Vaginal exam:  Dilation: 2 Effacement (%): 80 Cervical Position: Posterior Station: -2 Presentation: Vertex Exam by:: F. Ercel Normoyle, RNC,   Also reviewed contraction pattern and that non-stress test is reactive.  It has been documented that patient is contracting every 5-10 minutes with no cervical change over 1 hour not indicating active labor.  Patient denies any other complaints.  Based on this report provider has given order for discharge.  A discharge order and diagnosis entered by a provider.   Labor discharge instructions reviewed with patient.

## 2018-10-31 ENCOUNTER — Inpatient Hospital Stay (HOSPITAL_COMMUNITY): Payer: Medicaid Other | Admitting: Anesthesiology

## 2018-10-31 ENCOUNTER — Other Ambulatory Visit (HOSPITAL_COMMUNITY): Payer: Self-pay | Admitting: *Deleted

## 2018-10-31 ENCOUNTER — Inpatient Hospital Stay (HOSPITAL_COMMUNITY)
Admission: AD | Admit: 2018-10-31 | Discharge: 2018-11-02 | DRG: 807 | Disposition: A | Discharge status: Home / Self Care | Payer: Medicaid Other | Attending: Family Medicine | Admitting: Family Medicine

## 2018-10-31 ENCOUNTER — Other Ambulatory Visit: Payer: Self-pay

## 2018-10-31 ENCOUNTER — Encounter (HOSPITAL_COMMUNITY): Payer: Self-pay | Admitting: *Deleted

## 2018-10-31 DIAGNOSIS — O43899 Other placental disorders, unspecified trimester: Secondary | ICD-10-CM | POA: Diagnosis not present

## 2018-10-31 DIAGNOSIS — O26893 Other specified pregnancy related conditions, third trimester: Secondary | ICD-10-CM | POA: Diagnosis present

## 2018-10-31 DIAGNOSIS — O9952 Diseases of the respiratory system complicating childbirth: Secondary | ICD-10-CM | POA: Diagnosis present

## 2018-10-31 DIAGNOSIS — Z8669 Personal history of other diseases of the nervous system and sense organs: Secondary | ICD-10-CM

## 2018-10-31 DIAGNOSIS — Z3A40 40 weeks gestation of pregnancy: Secondary | ICD-10-CM

## 2018-10-31 DIAGNOSIS — Z87891 Personal history of nicotine dependence: Secondary | ICD-10-CM

## 2018-10-31 DIAGNOSIS — J4520 Mild intermittent asthma, uncomplicated: Secondary | ICD-10-CM | POA: Diagnosis present

## 2018-10-31 DIAGNOSIS — Z3A00 Weeks of gestation of pregnancy not specified: Secondary | ICD-10-CM | POA: Diagnosis not present

## 2018-10-31 DIAGNOSIS — O480 Post-term pregnancy: Secondary | ICD-10-CM | POA: Diagnosis not present

## 2018-10-31 DIAGNOSIS — Z3402 Encounter for supervision of normal first pregnancy, second trimester: Secondary | ICD-10-CM

## 2018-10-31 LAB — CBC
HCT: 34.2 % — ABNORMAL LOW (ref 36.0–46.0)
Hemoglobin: 10.9 g/dL — ABNORMAL LOW (ref 12.0–15.0)
MCH: 27.5 pg (ref 26.0–34.0)
MCHC: 31.9 g/dL (ref 30.0–36.0)
MCV: 86.4 fL (ref 80.0–100.0)
Platelets: 267 10*3/uL (ref 150–400)
RBC: 3.96 MIL/uL (ref 3.87–5.11)
RDW: 18.2 % — ABNORMAL HIGH (ref 11.5–15.5)
WBC: 7.1 10*3/uL (ref 4.0–10.5)
nRBC: 0 % (ref 0.0–0.2)

## 2018-10-31 LAB — TYPE AND SCREEN
ABO/RH(D): A POS
Antibody Screen: NEGATIVE

## 2018-10-31 LAB — ABO/RH: ABO/RH(D): A POS

## 2018-10-31 LAB — RPR: RPR Ser Ql: NONREACTIVE

## 2018-10-31 MED ORDER — ACETAMINOPHEN 325 MG PO TABS: 650.0000 mg | ORAL_TABLET | ORAL | Status: DC | PRN

## 2018-10-31 MED ORDER — FENTANYL-BUPIVACAINE-NACL 0.5-0.125-0.9 MG/250ML-% EP SOLN
12.0000 mL/h | EPIDURAL | Status: DC | PRN
  Filled 2018-10-31: qty 250

## 2018-10-31 MED ORDER — SOD CITRATE-CITRIC ACID 500-334 MG/5ML PO SOLN: 30.0000 mL | ORAL | Status: DC | PRN

## 2018-10-31 MED ORDER — LACTATED RINGERS IV SOLN
500.0000 mL | Freq: Once | INTRAVENOUS | Status: AC
  Administered 2018-10-31: 500 mL via INTRAVENOUS

## 2018-10-31 MED ORDER — DIPHENHYDRAMINE HCL 50 MG/ML IJ SOLN: 12.5000 mg | INTRAMUSCULAR | Status: DC | PRN

## 2018-10-31 MED ORDER — LIDOCAINE HCL (PF) 1 % IJ SOLN
30.0000 mL | INTRAMUSCULAR | Status: DC | PRN
  Filled 2018-10-31: qty 30

## 2018-10-31 MED ORDER — PHENYLEPHRINE 40 MCG/ML (10ML) SYRINGE FOR IV PUSH (FOR BLOOD PRESSURE SUPPORT)
80.0000 ug | PREFILLED_SYRINGE | INTRAVENOUS | Status: DC | PRN
  Filled 2018-10-31: qty 10

## 2018-10-31 MED ORDER — OXYTOCIN 40 UNITS IN NORMAL SALINE INFUSION - SIMPLE MED
2.5000 [IU]/h | INTRAVENOUS | Status: DC
  Filled 2018-10-31 (×2): qty 1000

## 2018-10-31 MED ORDER — EPHEDRINE 5 MG/ML INJ: 10.0000 mg | INTRAVENOUS | Status: DC | PRN

## 2018-10-31 MED ORDER — PHENYLEPHRINE 40 MCG/ML (10ML) SYRINGE FOR IV PUSH (FOR BLOOD PRESSURE SUPPORT)
80.0000 ug | PREFILLED_SYRINGE | INTRAVENOUS | Status: DC | PRN
  Administered 2018-10-31: 80 ug via INTRAVENOUS

## 2018-10-31 MED ORDER — ONDANSETRON HCL 4 MG/2ML IJ SOLN
4.0000 mg | Freq: Four times a day (QID) | INTRAMUSCULAR | Status: DC | PRN
  Administered 2018-10-31: 4 mg via INTRAVENOUS
  Filled 2018-10-31: qty 2

## 2018-10-31 MED ORDER — OXYTOCIN 40 UNITS IN NORMAL SALINE INFUSION - SIMPLE MED
1.0000 m[IU]/min | INTRAVENOUS | Status: DC
  Administered 2018-10-31: 2 m[IU]/min via INTRAVENOUS

## 2018-10-31 MED ORDER — OXYCODONE-ACETAMINOPHEN 5-325 MG PO TABS
2.0000 | ORAL_TABLET | ORAL | Status: DC | PRN
  Administered 2018-11-01: 2 via ORAL
  Filled 2018-10-31: qty 2

## 2018-10-31 MED ORDER — LIDOCAINE-EPINEPHRINE (PF) 2 %-1:200000 IJ SOLN
INTRAMUSCULAR | Status: DC | PRN
  Administered 2018-10-31: 5 mL via EPIDURAL

## 2018-10-31 MED ORDER — LACTATED RINGERS IV SOLN
INTRAVENOUS | Status: DC
  Administered 2018-10-31 – 2018-11-01 (×6): via INTRAVENOUS

## 2018-10-31 MED ORDER — OXYCODONE-ACETAMINOPHEN 5-325 MG PO TABS: 1.0000 | ORAL_TABLET | ORAL | Status: DC | PRN

## 2018-10-31 MED ORDER — SODIUM CHLORIDE (PF) 0.9 % IJ SOLN
INTRAMUSCULAR | Status: DC | PRN
  Administered 2018-10-31: 14 mL/h via EPIDURAL

## 2018-10-31 MED ORDER — OXYTOCIN BOLUS FROM INFUSION
500.0000 mL | Freq: Once | INTRAVENOUS | Status: AC
  Administered 2018-11-01: 500 mL via INTRAVENOUS

## 2018-10-31 MED ORDER — FENTANYL CITRATE (PF) 100 MCG/2ML IJ SOLN: 100.0000 ug | INTRAMUSCULAR | Status: DC | PRN

## 2018-10-31 MED ORDER — LACTATED RINGERS IV SOLN
500.0000 mL | INTRAVENOUS | Status: DC | PRN
  Administered 2018-11-01: 500 mL via INTRAVENOUS

## 2018-10-31 MED ORDER — TERBUTALINE SULFATE 1 MG/ML IJ SOLN: 0.2500 mg | Freq: Once | INTRAMUSCULAR | Status: DC | PRN

## 2018-10-31 NOTE — Progress Notes (Signed)
Patient ID: Natasha Martinez, female   DOB: Mar 15, 1996, 23 y.o.   MRN: [id number]  Comfortable w/ epidural; had cx exam around 1300 without change, so Pit was started then  BP 103/50, P 67 FHR 120s, 10x10accels, min-mod variability Ctx more irreg q 5-8 mins, Pit now on 39mu/min Cx at 1300 5/C/vtx -1  IUP@term  Latent labor GBS neg  Will titrate Pit to achieve active labor Plan to check cx in 2-3 hrs or sooner prn  [name] CNM 10/31/2018

## 2018-10-31 NOTE — Anesthesia Procedure Notes (Signed)
Epidural Patient location during procedure: OB Start time: 10/31/2018 9:07 AM End time: 10/31/2018 9:19 AM  Staffing Anesthesiologist: Lucretia Kern, MD Performed: anesthesiologist   Preanesthetic Checklist Completed: patient identified, pre-op evaluation, timeout performed, IV checked, risks and benefits discussed and monitors and equipment checked  Epidural Patient position: sitting Prep: DuraPrep Patient monitoring: heart rate, continuous pulse ox and blood pressure Approach: midline Location: L3-L4 Injection technique: LOR air  Needle:  Needle type: Tuohy  Needle gauge: 17 G Needle length: 9 cm Needle insertion depth: 6 cm Catheter type: closed end flexible Catheter size: 19 Gauge Catheter at skin depth: 11 cm Test dose: negative and 2% lidocaine with Epi 1:200 K  Assessment Events: blood not aspirated, injection not painful, no injection resistance, negative IV test and no paresthesia  Additional Notes Reason for block:procedure for pain

## 2018-10-31 NOTE — Progress Notes (Signed)
Patient ID: Natasha Martinez, female   DOB: 1995-11-13, 23 y.o.   MRN: [id number]  Comfortable with epidural other than some mild low right abd pain  BP 109/78, P 74 FHR 130s, +accels, no decels, Cat 1 Ctx q 2-3 mins with Pit @ 42mu/min Cx at 1830 per RN: 6-7/swollen/vtx -1  IUP@term  Active labor  Plan to reposition and use epidural PCA for comfort Check cx at 2100 or sooner prn  [name] CNM 10/31/2018 7:49 PM

## 2018-10-31 NOTE — H&P (Signed)
Natasha Martinez is a 23 y.o. female G1 @ 40.4wks by LMP and confirmed by 6wk scan presenting for reg ctx since 0200 which became stronger by 0600. Denies leaking or bldg; no H/A, N/V or visual disturbances. Her preg has been followed by the [facility] service since 17wks and has been remarkable for 1) hx mild asthma 2) hx migranes 3) hx childhood seizures 4) GBS neg  OB History    Gravida  1   Para      Term      Preterm      AB      Living        SAB      TAB      Ectopic      Multiple      Live Births             Past Medical History:  Diagnosis Date  . Asthma    hasn't used inhaler in years  . Migraine   . Seizures (HCC)    as a child, last time when 23 yrs old   Past Surgical History:  Procedure Laterality Date  . Wisdom tooth removal     Family History: family history includes Healthy in her father and mother. Social History:  reports that she quit smoking about 9 months ago. Her smoking use included cigars. She has never used smokeless tobacco. She reports that she does not drink alcohol or use drugs.     Maternal Diabetes: No Genetic Screening: Normal Maternal Ultrasounds/Referrals: Normal Fetal Ultrasounds or other Referrals:  None Maternal Substance Abuse:  No Significant Maternal Medications:  None Significant Maternal Lab Results:  Lab values include: Group B Strep negative Other Comments:  None  ROS History Dilation: 5 Effacement (%): 90 Station: -2 Exam by:: A[employer]ennison RN Blood pressure 115/70, pulse 100, temperature 97.8 F (36.6 C), temperature source Oral, resp. rate 19, weight 74.9 kg, last menstrual period 01/20/2018. Exam Physical Exam  Constitutional: She is oriented to person, place, and time. She appears well-developed.  HENT:  Head: Normocephalic.  Neck: Normal range of motion.  Cardiovascular: Normal rate.  Respiratory: Effort normal.  GI:  EFM 145-150, +accels, no decels, Cat 1 Ctx q 3-5 mins  Musculoskeletal: Normal  range of motion.  Neurological: She is alert and oriented to person, place, and time.  Skin: Skin is warm and dry.  Psychiatric: She has a normal mood and affect. Her behavior is normal. Thought content normal.    Prenatal labs: ABO, Rh: --/--/A POS (04/10 0740) Antibody: NEG (04/10 0740) Rubella: 3.86 (11/01 1003) RPR: Non Reactive (12/30 0830)  HBsAg: Negative (11/01 1003)  HIV: Non Reactive (12/30 0830)  GBS: Negative (03/10 0000)   Assessment/Plan: IUP@40 .4wks Early active labor GBS neg  Admit to Labor & Delivery Expectant management to start; will augment prn Plans epidural Anticipate SVD   [name] CNM 10/31/2018, 9:08 AM

## 2018-10-31 NOTE — Progress Notes (Signed)
Patient ID: Natasha Martinez, female   DOB: 1996-06-13, 23 y.o.   MRN: [id number]  Comfortable w/ epidural  BP 113/71, P 66 FHR 120s, min-mod LTV, +10x10accels Ctx q 1-3 mins with Pit at 36mu/min Cx 6/C/vtx -2; SROM with exam for clear/mucousy fluid  IUP@term  Early active labor GBS neg  Keep ctx reg with Pit Check cx in 2 hrs or sooner prn  [name] CNM 10/31/2018 4:24 PM

## 2018-10-31 NOTE — MAU Note (Signed)
Pt reports to MAU c/o ctx 5-7 min apart with some bloody show. No LOF. +FM.

## 2018-10-31 NOTE — Progress Notes (Signed)
Patient ID: Natasha Martinez, female   DOB: 1996-07-16, 23 y.o.   MRN: [id number]  Beginning to feel some rectal/vag pressure; using peanut ball currently and epidural working well  BP 113/66, P 66 FHR 130s, +accels, Cat 1 Ctx q 1-3 mins with Pit at 17mu/min Cx 8/70% ant lip, 90% the rest/0  IUP@term  Active labor  Progressing well; plan on checking cx in 2 hrs or sooner if pressure increases  [name] [facility] 10/31/2018 9:36 PM

## 2018-10-31 NOTE — Progress Notes (Signed)
Patient ID: Natasha Martinez, female   DOB: 05-Apr-1996, 23 y.o.   MRN: [id number]  Updated progress: FHR 135-140, +accels, occ variables, Cat 1 Ctx q 2-4 mins with Pit at 29mu/min Cx C/C vtx +3 Begin pushing with ctx; anticipate SVD  [name] [facility] 10/31/2018 11:24 PM

## 2018-10-31 NOTE — Anesthesia Preprocedure Evaluation (Signed)
Anesthesia Evaluation  Patient identified by MRN, date of birth, ID band Patient awake    Reviewed: Allergy & Precautions, H&P , NPO status , Patient's Chart, lab work & pertinent test results  History of Anesthesia Complications Negative for: history of anesthetic complications  Airway Mallampati: II  TM Distance: >3 FB Neck ROM: full    Dental no notable dental hx.    Pulmonary asthma (last inhaler use >1 year ago) , former smoker,    Pulmonary exam normal        Cardiovascular negative cardio ROS Normal cardiovascular exam Rhythm:regular Rate:Normal     Neuro/Psych Seizures - (childhood, no meds),  negative psych ROS   GI/Hepatic negative GI ROS, Neg liver ROS,   Endo/Other  negative endocrine ROS  Renal/GU negative Renal ROS  negative genitourinary   Musculoskeletal   Abdominal   Peds  Hematology negative hematology ROS (+)   Anesthesia Other Findings   Reproductive/Obstetrics (+) Pregnancy                             Anesthesia Physical Anesthesia Plan  ASA: II  Anesthesia Plan: Epidural   Post-op Pain Management:    Induction:   PONV Risk Score and Plan:   Airway Management Planned:   Additional Equipment:   Intra-op Plan:   Post-operative Plan:   Informed Consent: I have reviewed the patients History and Physical, chart, labs and discussed the procedure including the risks, benefits and alternatives for the proposed anesthesia with the patient or authorized representative who has indicated his/her understanding and acceptance.       Plan Discussed with:   Anesthesia Plan Comments:         Anesthesia Quick Evaluation

## 2018-11-01 ENCOUNTER — Encounter (HOSPITAL_COMMUNITY): Payer: Self-pay | Admitting: *Deleted

## 2018-11-01 DIAGNOSIS — O480 Post-term pregnancy: Secondary | ICD-10-CM

## 2018-11-01 DIAGNOSIS — Z3A40 40 weeks gestation of pregnancy: Secondary | ICD-10-CM

## 2018-11-01 LAB — CBC
HCT: 32.6 % — ABNORMAL LOW (ref 36.0–46.0)
Hemoglobin: 10.3 g/dL — ABNORMAL LOW (ref 12.0–15.0)
MCH: 27.2 pg (ref 26.0–34.0)
MCHC: 31.6 g/dL (ref 30.0–36.0)
MCV: 86 fL (ref 80.0–100.0)
Platelets: 253 10*3/uL (ref 150–400)
RBC: 3.79 MIL/uL — ABNORMAL LOW (ref 3.87–5.11)
RDW: 18 % — ABNORMAL HIGH (ref 11.5–15.5)
WBC: 14 10*3/uL — ABNORMAL HIGH (ref 4.0–10.5)
nRBC: 0 % (ref 0.0–0.2)

## 2018-11-01 MED ORDER — MEASLES, MUMPS & RUBELLA VAC IJ SOLR: 0.5000 mL | Freq: Once | INTRAMUSCULAR | Status: DC

## 2018-11-01 MED ORDER — PRENATAL MULTIVITAMIN CH
1.0000 | ORAL_TABLET | Freq: Every day | ORAL | Status: DC
  Administered 2018-11-01 – 2018-11-02 (×2): 1 via ORAL
  Filled 2018-11-01 (×2): qty 1

## 2018-11-01 MED ORDER — TETANUS-DIPHTH-ACELL PERTUSSIS 5-2.5-18.5 LF-MCG/0.5 IM SUSP: 0.5000 mL | Freq: Once | INTRAMUSCULAR | Status: DC

## 2018-11-01 MED ORDER — DIPHENHYDRAMINE HCL 25 MG PO CAPS: 25.0000 mg | ORAL_CAPSULE | Freq: Four times a day (QID) | ORAL | Status: DC | PRN

## 2018-11-01 MED ORDER — MISOPROSTOL 100 MCG PO TABS: 25.0000 ug | ORAL_TABLET | ORAL | Status: DC | PRN

## 2018-11-01 MED ORDER — LACTATED RINGERS IV SOLN: INTRAVENOUS | Status: DC

## 2018-11-01 MED ORDER — BENZOCAINE-MENTHOL 20-0.5 % EX AERO: 1.0000 "application " | INHALATION_SPRAY | CUTANEOUS | Status: DC | PRN

## 2018-11-01 MED ORDER — OXYTOCIN BOLUS FROM INFUSION: 500.0000 mL | Freq: Once | INTRAVENOUS | Status: DC

## 2018-11-01 MED ORDER — TERBUTALINE SULFATE 1 MG/ML IJ SOLN
0.2500 mg | Freq: Once | INTRAMUSCULAR | Status: DC | PRN
  Filled 2018-11-01: qty 1

## 2018-11-01 MED ORDER — DIBUCAINE (PERIANAL) 1 % EX OINT: 1.0000 "application " | TOPICAL_OINTMENT | CUTANEOUS | Status: DC | PRN

## 2018-11-01 MED ORDER — ACETAMINOPHEN 325 MG PO TABS: 650.0000 mg | ORAL_TABLET | ORAL | Status: DC | PRN

## 2018-11-01 MED ORDER — LACTATED RINGERS IV SOLN: 500.0000 mL | INTRAVENOUS | Status: DC | PRN

## 2018-11-01 MED ORDER — WITCH HAZEL-GLYCERIN EX PADS: 1.0000 "application " | MEDICATED_PAD | CUTANEOUS | Status: DC | PRN

## 2018-11-01 MED ORDER — SOD CITRATE-CITRIC ACID 500-334 MG/5ML PO SOLN: 30.0000 mL | ORAL | Status: DC | PRN

## 2018-11-01 MED ORDER — OXYCODONE-ACETAMINOPHEN 5-325 MG PO TABS: 2.0000 | ORAL_TABLET | ORAL | Status: DC | PRN

## 2018-11-01 MED ORDER — COCONUT OIL OIL
1.0000 "application " | TOPICAL_OIL | Status: DC | PRN
  Administered 2018-11-02: 1 via TOPICAL

## 2018-11-01 MED ORDER — ONDANSETRON HCL 4 MG/2ML IJ SOLN: 4.0000 mg | INTRAMUSCULAR | Status: DC | PRN

## 2018-11-01 MED ORDER — ONDANSETRON HCL 4 MG PO TABS: 4.0000 mg | ORAL_TABLET | ORAL | Status: DC | PRN

## 2018-11-01 MED ORDER — IBUPROFEN 600 MG PO TABS
600.0000 mg | ORAL_TABLET | Freq: Four times a day (QID) | ORAL | Status: DC
  Administered 2018-11-01 – 2018-11-02 (×5): 600 mg via ORAL
  Filled 2018-11-01 (×5): qty 1

## 2018-11-01 MED ORDER — SENNOSIDES-DOCUSATE SODIUM 8.6-50 MG PO TABS
2.0000 | ORAL_TABLET | ORAL | Status: DC
  Administered 2018-11-01: 2 via ORAL
  Filled 2018-11-01: qty 2

## 2018-11-01 MED ORDER — LIDOCAINE HCL (PF) 1 % IJ SOLN: 30.0000 mL | INTRAMUSCULAR | Status: DC | PRN

## 2018-11-01 MED ORDER — ONDANSETRON HCL 4 MG/2ML IJ SOLN: 4.0000 mg | Freq: Four times a day (QID) | INTRAMUSCULAR | Status: DC | PRN

## 2018-11-01 MED ORDER — OXYCODONE-ACETAMINOPHEN 5-325 MG PO TABS: 1.0000 | ORAL_TABLET | ORAL | Status: DC | PRN

## 2018-11-01 MED ORDER — SIMETHICONE 80 MG PO CHEW: 80.0000 mg | CHEWABLE_TABLET | ORAL | Status: DC | PRN

## 2018-11-01 MED ORDER — OXYTOCIN 40 UNITS IN NORMAL SALINE INFUSION - SIMPLE MED: 2.5000 [IU]/h | INTRAVENOUS | Status: DC

## 2018-11-01 MED ORDER — ZOLPIDEM TARTRATE 5 MG PO TABS: 5.0000 mg | ORAL_TABLET | Freq: Every evening | ORAL | Status: DC | PRN

## 2018-11-01 NOTE — Discharge Summary (Signed)
OB Discharge Summary     Patient Name: Natasha Martinez DOB: [birth year] MRN: [id number]  Date of admission: 10/31/2018 Delivering MD: Arvilla Market   Date of discharge: 11/02/2018  Admitting diagnosis: 40 WKS, CTX Intrauterine pregnancy: [redacted]w[redacted]d     Secondary diagnosis:  Active Problems:   History of seizures as a child   Asthma in adult, mild intermittent, uncomplicated   Indication for care in labor or delivery  Additional problems: None     Discharge diagnosis: Term Pregnancy Delivered                                                                                                Post partum procedures: None  Augmentation: Pitocin  Complications: None  Hospital course:  Onset of Labor With Vaginal Delivery     23 y.o. yo G1P0 at [redacted]w[redacted]d was admitted in Active Labor on 10/31/2018. Patient had an uncomplicated labor course as follows:  Membrane Rupture Time/Date: 4:17 PM ,10/31/2018   Intrapartum Procedures: Episiotomy: None [1]                                         Lacerations:  Labial [10]  Patient had a delivery of a Viable infant. 11/01/2018  Information for the patient's newborn:  Amariyanna, Kassem [[id number]]  Delivery Method: Vag-Spont    Pateint had an uncomplicated postpartum course.  She is ambulating, tolerating a regular diet, passing flatus, and urinating well. Patient is discharged home in stable condition on 11/02/18.   Physical exam  Vitals:   11/01/18 1535 11/01/18 1943 11/02/18 0541 11/02/18 1351  BP: 113/65 98/62 108/76 107/69  Pulse: 70 67 61 63  Resp: 18 18  18   Temp: 99 F (37.2 C) 98.7 F (37.1 C) 98.2 F (36.8 C) 98 F (36.7 C)  TempSrc: Oral  Oral Oral  SpO2: 99% 100%  100%  Weight:      Height:       General: alert, cooperative and no distress Lochia: appropriate Uterine Fundus: firm Incision: N/A DVT Evaluation: No evidence of DVT seen on physical exam. Labs: Lab Results  Component Value Date   WBC 14.0 (H) 11/01/2018    HGB 10.3 (L) 11/01/2018   HCT 32.6 (L) 11/01/2018   MCV 86.0 11/01/2018   PLT 253 11/01/2018   CMP Latest Ref Rng & Units 09/15/2018  Glucose 70 - 99 mg/dL 87  BUN 6 - 20 mg/dL 6  Creatinine 2.08 - 0.22 mg/dL 3.36  Sodium 122 - 449 mmol/L 137  Potassium 3.5 - 5.1 mmol/L 3.6  Chloride 98 - 111 mmol/L 108  CO2 22 - 32 mmol/L 23  Calcium 8.9 - 10.3 mg/dL 7.5(P)  Total Protein 6.5 - 8.1 g/dL 6.0(L)  Total Bilirubin 0.3 - 1.2 mg/dL 0.0(F)  Alkaline Phos 38 - 126 U/L 80  AST 15 - 41 U/L 21  ALT 0 - 44 U/L 15    Discharge instruction: per After Visit Summary and "Baby and Me Booklet".  After visit  meds:  Allergies as of 11/02/2018   No Known Allergies     Medication List    STOP taking these medications   docusate sodium 100 MG capsule Commonly known as:  COLACE     TAKE these medications   acetaminophen 325 MG tablet Commonly known as:  TYLENOL Take 325 mg by mouth every 6 (six) hours as needed for mild pain.   albuterol 108 (90 Base) MCG/ACT inhaler Commonly known as:  PROVENTIL HFA;VENTOLIN HFA Inhale 2 puffs into the lungs 4 (four) times daily. What changed:    when to take this  reasons to take this   CitraNatal 90 DHA 90-1 & 300 MG Misc Take 1 tablet by mouth daily.   ferrous sulfate 325 (65 FE) MG tablet Take 1 tablet (325 mg total) by mouth every other day.       Diet: routine diet  Activity: Advance as tolerated. Pelvic rest for 6 weeks.   Outpatient follow up:4 weeks Follow up Appt:No future appointments. Follow up Visit:No follow-ups on file.  Postpartum contraception: Depo Provera  Newborn Data: Live born female  Birth Weight:  3320g APGAR: 8, 9  Newborn Delivery   Birth date/time:  11/01/2018 03:29:00 Delivery type:  Vaginal, Spontaneous     Baby Feeding: Breast Disposition:home with mother  Thressa ShellerHeather Nathaneil Feagans DNP, CNM  11/02/18  4:18 PM

## 2018-11-01 NOTE — Anesthesia Postprocedure Evaluation (Incomplete)
Anesthesia Post Note  Patient: Delicia Lamp  Procedure(s) Performed: AN AD HOC LABOR EPIDURAL     Anesthesia Type: Epidural    Last Vitals:  Vitals:   11/01/18 0614 11/01/18 0730  BP: 110/71 110/64  Pulse: 62 (!) 59  Resp: 18 16  Temp: 36.9 C 36.9 C  SpO2: 99% 99%    Last Pain:  Vitals:   11/01/18 0730  TempSrc: Oral  PainSc:    Pain Goal: Patients Stated Pain Goal: 0 (10/31/18 0620)                 Salome Arnt

## 2018-11-01 NOTE — Lactation Note (Signed)
This note was copied from a baby's chart. Lactation Consultation Note  Patient Name: Natasha Martinez [id number] Date: 11/01/2018 Reason for consult: Initial assessment;Primapara;Term Baby is 42 hours old.  Baby has fed 4 times.  Mom states baby latches with ease.  Instructed to feed with any feeding cue and call for assist prn.  Breastfeeding consultation services and support information given.  Maternal Data    Feeding Feeding Type: Breast Fed  LATCH Score                   Interventions    Lactation Tools Discussed/Used     Consult Status Consult Status: Follow-up Date: 11/02/18 Follow-up type: In-patient    Huston Foley 11/01/2018, 1:56 PM

## 2018-11-01 NOTE — Anesthesia Postprocedure Evaluation (Signed)
Anesthesia Post Note  Patient: Natasha Martinez  Procedure(s) Performed: AN AD HOC LABOR EPIDURAL     Patient location during evaluation: Mother Baby Anesthesia Type: Epidural Level of consciousness: awake and oriented Pain management: pain level controlled Vital Signs Assessment: post-procedure vital signs reviewed and stable Respiratory status: spontaneous breathing and respiratory function stable Cardiovascular status: blood pressure returned to baseline Postop Assessment: no headache Anesthetic complications: no    Last Vitals:  Vitals:   11/01/18 1200 11/01/18 1535  BP: 99/63 113/65  Pulse: 66 70  Resp: 18 18  Temp: 37 C 37.2 C  SpO2: 99% 99%    Last Pain:  Vitals:   11/01/18 1536  TempSrc:   PainSc: 3    Pain Goal: Patients Stated Pain Goal: 0 (10/31/18 0620)                 Gladys Damme

## 2018-11-02 LAB — RPR: RPR Ser Ql: NONREACTIVE

## 2018-11-02 NOTE — Progress Notes (Signed)
Post Partum Day 1 Subjective: no complaints, up ad lib, voiding, tolerating PO and + flatus  Objective: Blood pressure 108/76, pulse 61, temperature 98.2 F (36.8 C), temperature source Oral, resp. rate 18, height 5\' 3"  (1.6 m), weight 74.9 kg, last menstrual period 01/20/2018, SpO2 100 %, unknown if currently breastfeeding.  Physical Exam:  General: alert, cooperative, appears stated age and no distress Lochia: appropriate Uterine Fundus: firm Incision: NA DVT Evaluation: No evidence of DVT seen on physical exam.  Recent Labs    10/31/18 0738 11/01/18 0737  HGB 10.9* 10.3*  HCT 34.2* 32.6*    Assessment/Plan: Patient feeling well and would like to go home today Will wait to see if pediatrics is planning to discharge baby No concerns at this time from an obstetrics standpoint   LOS: 2 days   Natasha Martinez 11/02/2018, 8:50 AM

## 2018-11-03 ENCOUNTER — Inpatient Hospital Stay (HOSPITAL_COMMUNITY): Payer: Medicaid Other

## 2018-11-04 ENCOUNTER — Encounter: Payer: Self-pay | Admitting: Student

## 2018-11-07 ENCOUNTER — Inpatient Hospital Stay (HOSPITAL_COMMUNITY)
Admission: AD | Admit: 2018-11-07 | Discharge: 2018-11-09 | DRG: 776 | Disposition: A | Discharge status: Home / Self Care | Payer: Medicaid Other | Attending: Obstetrics and Gynecology | Admitting: Obstetrics and Gynecology

## 2018-11-07 ENCOUNTER — Encounter (HOSPITAL_COMMUNITY): Payer: Self-pay | Admitting: Pharmacy Technician

## 2018-11-07 ENCOUNTER — Other Ambulatory Visit: Payer: Self-pay

## 2018-11-07 DIAGNOSIS — R51 Headache: Secondary | ICD-10-CM | POA: Diagnosis present

## 2018-11-07 DIAGNOSIS — Z87891 Personal history of nicotine dependence: Secondary | ICD-10-CM | POA: Diagnosis not present

## 2018-11-07 DIAGNOSIS — O1415 Severe pre-eclampsia, complicating the puerperium: Secondary | ICD-10-CM | POA: Diagnosis not present

## 2018-11-07 DIAGNOSIS — O1495 Unspecified pre-eclampsia, complicating the puerperium: Secondary | ICD-10-CM

## 2018-11-07 LAB — COMPREHENSIVE METABOLIC PANEL
ALT: 20 U/L (ref 0–44)
AST: 24 U/L (ref 15–41)
Albumin: 3.3 g/dL — ABNORMAL LOW (ref 3.5–5.0)
Alkaline Phosphatase: 126 U/L (ref 38–126)
Anion gap: 7 (ref 5–15)
BUN: 5 mg/dL — ABNORMAL LOW (ref 6–20)
CO2: 30 mmol/L (ref 22–32)
Calcium: 9.8 mg/dL (ref 8.9–10.3)
Chloride: 106 mmol/L (ref 98–111)
Creatinine, Ser: 1.04 mg/dL — ABNORMAL HIGH (ref 0.44–1.00)
GFR calc Af Amer: >60 mL/min (ref >60)
GFR calc non Af Amer: >60 mL/min (ref >60)
Glucose, Bld: 89 mg/dL (ref 70–99)
Potassium: 3.9 mmol/L (ref 3.5–5.1)
Sodium: 143 mmol/L (ref 135–145)
Total Bilirubin: 0.6 mg/dL (ref 0.3–1.2)
Total Protein: 7.4 g/dL (ref 6.5–8.1)

## 2018-11-07 LAB — CBC WITH DIFFERENTIAL/PLATELET
Abs Immature Granulocytes: 0.06 10*3/uL (ref 0.00–0.07)
Basophils Absolute: 0 10*3/uL (ref 0.0–0.1)
Basophils Relative: 0 %
Eosinophils Absolute: 0 10*3/uL (ref 0.0–0.5)
Eosinophils Relative: 0 %
HCT: 37.1 % (ref 36.0–46.0)
Hemoglobin: 11.3 g/dL — ABNORMAL LOW (ref 12.0–15.0)
Immature Granulocytes: 1 %
Lymphocytes Relative: 34 %
Lymphs Abs: 2.3 10*3/uL (ref 0.7–4.0)
MCH: 26.1 pg (ref 26.0–34.0)
MCHC: 30.5 g/dL (ref 30.0–36.0)
MCV: 85.7 fL (ref 80.0–100.0)
Monocytes Absolute: 0.6 10*3/uL (ref 0.1–1.0)
Monocytes Relative: 9 %
Neutro Abs: 3.8 10*3/uL (ref 1.7–7.7)
Neutrophils Relative %: 56 %
Platelets: 304 10*3/uL (ref 150–400)
RBC: 4.33 MIL/uL (ref 3.87–5.11)
RDW: 16.6 % — ABNORMAL HIGH (ref 11.5–15.5)
WBC: 6.8 10*3/uL (ref 4.0–10.5)
nRBC: 0 % (ref 0.0–0.2)

## 2018-11-07 LAB — URINALYSIS, ROUTINE W REFLEX MICROSCOPIC
Bilirubin Urine: NEGATIVE
Glucose, UA: NEGATIVE mg/dL
Hgb urine dipstick: NEGATIVE
Ketones, ur: NEGATIVE mg/dL
Leukocytes,Ua: NEGATIVE
Nitrite: NEGATIVE
Protein, ur: NEGATIVE mg/dL
Specific Gravity, Urine: 1.008 (ref 1.005–1.030)
pH: 9 — ABNORMAL HIGH (ref 5.0–8.0)

## 2018-11-07 LAB — PROTEIN / CREATININE RATIO, URINE
Creatinine, Urine: 22.07 mg/dL
Protein Creatinine Ratio: 1.09 mg/mg{Cre} — ABNORMAL HIGH (ref 0.00–0.15)
Total Protein, Urine: 24 mg/dL

## 2018-11-07 MED ORDER — HYDRALAZINE HCL 20 MG/ML IJ SOLN: 10.0000 mg | INTRAMUSCULAR | Status: DC | PRN

## 2018-11-07 MED ORDER — MAGNESIUM SULFATE 2 GM/50ML IV SOLN: 2.0000 g | Freq: Once | INTRAVENOUS | Status: DC

## 2018-11-07 MED ORDER — MAGNESIUM SULFATE BOLUS VIA INFUSION
4.0000 g | Freq: Once | INTRAVENOUS | Status: AC
  Administered 2018-11-07: 4 g via INTRAVENOUS

## 2018-11-07 MED ORDER — LACTATED RINGERS IV SOLN: INTRAVENOUS | Status: DC

## 2018-11-07 MED ORDER — OXYCODONE-ACETAMINOPHEN 5-325 MG PO TABS: 1.0000 | ORAL_TABLET | Freq: Four times a day (QID) | ORAL | Status: DC | PRN

## 2018-11-07 MED ORDER — OXYCODONE-ACETAMINOPHEN 5-325 MG PO TABS: 2.0000 | ORAL_TABLET | Freq: Once | ORAL | Status: DC

## 2018-11-07 MED ORDER — MAGNESIUM SULFATE 4 GM/100ML IV SOLN: 4.0000 g | Freq: Once | INTRAVENOUS | Status: DC

## 2018-11-07 MED ORDER — MAGNESIUM SULFATE 40 G IN LACTATED RINGERS - SIMPLE
2.0000 g/h | INTRAVENOUS | Status: AC
  Administered 2018-11-07 – 2018-11-08 (×2): 2 g/h via INTRAVENOUS
  Filled 2018-11-07 (×2): qty 500

## 2018-11-07 MED ORDER — DOCUSATE SODIUM 100 MG PO CAPS
100.0000 mg | ORAL_CAPSULE | Freq: Every day | ORAL | Status: DC
  Administered 2018-11-08 – 2018-11-09 (×2): 100 mg via ORAL
  Filled 2018-11-07 (×2): qty 1

## 2018-11-07 MED ORDER — ACETAMINOPHEN 325 MG PO TABS
650.0000 mg | ORAL_TABLET | ORAL | Status: DC | PRN
  Administered 2018-11-08 – 2018-11-09 (×2): 650 mg via ORAL
  Filled 2018-11-07 (×2): qty 2

## 2018-11-07 MED ORDER — MAGNESIUM SULFATE 40 G IN LACTATED RINGERS - SIMPLE: 2.0000 g/h | Freq: Once | INTRAVENOUS | Status: DC

## 2018-11-07 MED ORDER — ZOLPIDEM TARTRATE 5 MG PO TABS: 5.0000 mg | ORAL_TABLET | Freq: Every evening | ORAL | Status: DC | PRN

## 2018-11-07 MED ORDER — LABETALOL HCL 5 MG/ML IV SOLN
INTRAVENOUS | Status: AC
  Administered 2018-11-07: 20 mg via INTRAVENOUS
  Filled 2018-11-07: qty 4

## 2018-11-07 MED ORDER — HYDROMORPHONE HCL 1 MG/ML IJ SOLN
1.0000 mg | Freq: Once | INTRAMUSCULAR | Status: AC
  Administered 2018-11-07: 1 mg via INTRAVENOUS
  Filled 2018-11-07: qty 1

## 2018-11-07 MED ORDER — LABETALOL HCL 5 MG/ML IV SOLN
20.0000 mg | INTRAVENOUS | Status: DC | PRN
  Administered 2018-11-07: 20 mg via INTRAVENOUS

## 2018-11-07 MED ORDER — PRENATAL MULTIVITAMIN CH
1.0000 | ORAL_TABLET | Freq: Every day | ORAL | Status: DC
  Administered 2018-11-08: 1 via ORAL
  Filled 2018-11-07: qty 1

## 2018-11-07 MED ORDER — LABETALOL HCL 5 MG/ML IV SOLN
40.0000 mg | INTRAVENOUS | Status: DC | PRN
  Administered 2018-11-07: 40 mg via INTRAVENOUS
  Filled 2018-11-07: qty 8

## 2018-11-07 MED ORDER — MAGNESIUM SULFATE BOLUS VIA INFUSION
2.0000 g | Freq: Once | INTRAVENOUS | Status: DC
  Filled 2018-11-07: qty 500

## 2018-11-07 MED ORDER — LACTATED RINGERS IV SOLN
INTRAVENOUS | Status: DC
  Administered 2018-11-07 – 2018-11-08 (×3): via INTRAVENOUS

## 2018-11-07 MED ORDER — LABETALOL HCL 5 MG/ML IV SOLN
80.0000 mg | INTRAVENOUS | Status: DC | PRN
  Administered 2018-11-07: 80 mg via INTRAVENOUS
  Filled 2018-11-07: qty 16

## 2018-11-07 MED ORDER — CALCIUM CARBONATE ANTACID 500 MG PO CHEW: 2.0000 | CHEWABLE_TABLET | ORAL | Status: DC | PRN

## 2018-11-07 NOTE — MAU Note (Signed)
Pt unable to void currently. Just went to restroom before coming back to mau room

## 2018-11-07 NOTE — H&P (Addendum)
History     CSN: [id number]  Arrival date and time: 11/07/18 1811   First Provider Initiated Contact with Patient 11/07/18 2010      Chief Complaint  Patient presents with  . Headache   Natasha Martinez is a 23 y.o. G1P1001 who is 6 days PP from NSVD. She is here today with a headache that started this morning. She has tried Topamax, tylenol and Excedrin, but none have worked. She states that this does not feel like her typical migraine either. It is stronger than a typical migraine and all in the front. She got no relief from her measures at home. In addition, she has severe range blood pressure here today. She was normotensive in labor and during her PP stay.   Headache   This is a new problem. The current episode started today. The problem occurs constantly. The problem has been unchanged. The pain is located in the frontal region. The pain does not radiate. The pain quality is not similar to prior headaches. The pain is at a severity of 10/10. Pertinent negatives include no abdominal pain, fever, nausea or vomiting. She has tried acetaminophen and Excedrin (topamax ) for the symptoms. The treatment provided no relief. Her past medical history is significant for migraine headaches.    OB History    Gravida  1   Para  1   Term  1   Preterm      AB      Living  1     SAB      TAB      Ectopic      Multiple  0   Live Births  1           Past Medical History:  Diagnosis Date  . Asthma    hasn't used inhaler in years  . Migraine   . Seizures (HCC)    as a child, last time when 89 yrs old    Past Surgical History:  Procedure Laterality Date  . Wisdom tooth removal      Family History  Problem Relation Age of Onset  . Healthy Mother   . Healthy Father     Social History   Tobacco Use  . Smoking status: Former Smoker    Types: Cigars    Last attempt to quit: 01/2018    Years since quitting: 0.7  . Smokeless tobacco: Never Used  Substance Use  Topics  . Alcohol use: No  . Drug use: No    Allergies: No Known Allergies  Medications Prior to Admission  Medication Sig Dispense Refill Last Dose  . acetaminophen (TYLENOL) 325 MG tablet Take 500 mg by mouth every 6 (six) hours as needed for mild pain.    11/07/2018 at Unknown time  . aspirin-acetaminophen-caffeine (EXCEDRIN MIGRAINE) 250-250-65 MG tablet Take 2 tablets by mouth every 6 (six) hours as needed for headache.    at 1500  . ferrous sulfate 325 (65 FE) MG tablet Take 1 tablet (325 mg total) by mouth every other day. 30 tablet 0 11/07/2018 at Unknown time  . Prenat w/o A-FeCbGl-DSS-FA-DHA (CITRANATAL 90 DHA) 90-1 & 300 MG MISC Take 1 tablet by mouth daily. 30 each 11 11/07/2018 at Unknown time  . topiramate (TOPAMAX) 15 MG capsule Take 15 mg by mouth 2 (two) times daily.    at 1745  . albuterol (PROVENTIL HFA;VENTOLIN HFA) 108 (90 BASE) MCG/ACT inhaler Inhale 2 puffs into the lungs 4 (four) times daily. (Patient taking differently:  Inhale 2 puffs into the lungs every 6 (six) hours as needed for wheezing or shortness of breath. ) 1 Inhaler 0 More than a month at Unknown time    Review of Systems  Constitutional: Negative for chills and fever.  Eyes: Negative for visual disturbance.  Respiratory: Negative for shortness of breath.   Cardiovascular: Negative for chest pain.  Gastrointestinal: Negative for abdominal pain, nausea and vomiting.  Neurological: Positive for headaches.   Physical Exam   Blood pressure (!) 164/84, pulse 61, temperature 98.5 F (36.9 C), resp. rate 16, height  (1.6 m), weight 68.9 kg, SpO2 100 %, currently breastfeeding.  Physical Exam  Nursing note and vitals reviewed. Constitutional: She is oriented to person, place, and time. She appears well-developed and well-nourished. She appears distressed (crying and visibly very uncomfortable ).  HENT:  Head: Normocephalic.  Cardiovascular: Normal rate.  Respiratory: Effort normal.  GI: Soft. There  is no abdominal tenderness.  Neurological: She is alert and oriented to person, place, and time. She has 2+ reflexes. She exhibits normal muscle tone (no clonus ).  Skin: Skin is warm and dry.  Psychiatric: She has a normal mood and affect.   Results for orders placed or performed during the hospital encounter of 11/07/18 (from the past 24 hour(s))  Comprehensive metabolic panel     Status: Abnormal   Collection Time: 11/07/18  7:03 PM  Result Value Ref Range   Sodium 143 135 - 145 mmol/L   Potassium 3.9 3.5 - 5.1 mmol/L   Chloride 106 98 - 111 mmol/L   CO2 30 22 - 32 mmol/L   Glucose, Bld 89 70 - 99 mg/dL   BUN 5 (L) 6 - 20 mg/dL   Creatinine, Ser 1.61 (H) 0.44 - 1.00 mg/dL   Calcium 9.8 8.9 - 09.6 mg/dL   Total Protein 7.4 6.5 - 8.1 g/dL   Albumin 3.3 (L) 3.5 - 5.0 g/dL   AST 24 15 - 41 U/L   ALT 20 0 - 44 U/L   Alkaline Phosphatase 126 38 - 126 U/L   Total Bilirubin 0.6 0.3 - 1.2 mg/dL   GFR calc non Af Amer >60 >60 mL/min   GFR calc Af Amer >60 >60 mL/min   Anion gap 7 5 - 15  CBC with Differential     Status: Abnormal   Collection Time: 11/07/18  7:03 PM  Result Value Ref Range   WBC 6.8 4.0 - 10.5 K/uL   RBC 4.33 3.87 - 5.11 MIL/uL   Hemoglobin 11.3 (L) 12.0 - 15.0 g/dL   HCT 04.5 40.9 - 81.1 %   MCV 85.7 80.0 - 100.0 fL   MCH 26.1 26.0 - 34.0 pg   MCHC 30.5 30.0 - 36.0 g/dL   RDW 91.4 (H) 78.2 - 95.6 %   Platelets 304 150 - 400 K/uL   nRBC 0.0 0.0 - 0.2 %   Neutrophils Relative % 56 %   Neutro Abs 3.8 1.7 - 7.7 K/uL   Lymphocytes Relative 34 %   Lymphs Abs 2.3 0.7 - 4.0 K/uL   Monocytes Relative 9 %   Monocytes Absolute 0.6 0.1 - 1.0 K/uL   Eosinophils Relative 0 %   Eosinophils Absolute 0.0 0.0 - 0.5 K/uL   Basophils Relative 0 %   Basophils Absolute 0.0 0.0 - 0.1 K/uL   Immature Granulocytes 1 %   Abs Immature Granulocytes 0.06 0.00 - 0.07 K/uL     MAU Course  Procedures  MDM  Labetalol protocol started Dilaudid for pain Magnesium ordered  Likely  pre-eclampsia with severe features due to headache and severe range blood pressure.  8:31 PM DW Dr. Emelda Fear, will admit to [facility] for magnesium and blood pressure management. Urine P:cr pending     Assessment and Plan   1. Hypertension in pregnancy, preeclampsia, severe, delivered/postpartum    Admit to [facility] DNP, CNM  11/07/18  8:19 PM

## 2018-11-07 NOTE — MAU Note (Signed)
RN requested IV pain med to help with pt's headache. PT crying in pain.

## 2018-11-07 NOTE — MAU Note (Addendum)
Headache since this am. Feels like "beating" in my face. Came to [facility] ED and sent here due to HTN. States has hx migraines. Took Excedrine migraine and topamax but nothing helped. Denies any other problems. Has Saline lock in L hand. Denies visual changes or epigastric changes. Hx seizures but none in yrs and no meds. Pt teary eyed and requesting pain med. SVD 10/31/18

## 2018-11-07 NOTE — Plan of Care (Signed)

## 2018-11-07 NOTE — MAU Provider Note (Addendum)
History     CSN: [id number]  Arrival date and time: 11/07/18 1811   First Provider Initiated Contact with Patient 11/07/18 2010      Chief Complaint  Patient presents with  . Headache   Natasha Martinez is a 23 y.o. G1P1001 who is 6 days PP from [facility]. She is here today with a headache that started this morning. She has tried Topamax, tylenol and Excedrin, but none have worked. She states that this does not feel like her typical migraine either. It is stronger than a typical migraine and all in the front. She got no relief from her measures at home. In addition, she has severe range blood pressure here today. She was normotensive in labor and during her PP stay.   Headache   This is a new problem. The current episode started today. The problem occurs constantly. The problem has been unchanged. The pain is located in the frontal region. The pain does not radiate. The pain quality is not similar to prior headaches. The pain is at a severity of 10/10. Pertinent negatives include no abdominal pain, fever, nausea or vomiting. She has tried acetaminophen and Excedrin (topamax ) for the symptoms. The treatment provided no relief. Her past medical history is significant for migraine headaches.    OB History    Gravida  1   Para  1   Term  1   Preterm      AB      Living  1     SAB      TAB      Ectopic      Multiple  0   Live Births  1           Past Medical History:  Diagnosis Date  . Asthma    hasn't used inhaler in years  . Migraine   . Seizures (HCC)    as a child, last time when 89 yrs old    Past Surgical History:  Procedure Laterality Date  . Wisdom tooth removal      Family History  Problem Relation Age of Onset  . Healthy Mother   . Healthy Father     Social History   Tobacco Use  . Smoking status: Former Smoker    Types: Cigars    Last attempt to quit: 01/2018    Years since quitting: 0.7  . Smokeless tobacco: Never Used  Substance Use Topics   . Alcohol use: No  . Drug use: No    Allergies: No Known Allergies  Medications Prior to Admission  Medication Sig Dispense Refill Last Dose  . acetaminophen (TYLENOL) 325 MG tablet Take 500 mg by mouth every 6 (six) hours as needed for mild pain.    11/07/2018 at Unknown time  . aspirin-acetaminophen-caffeine (EXCEDRIN MIGRAINE) 250-250-65 MG tablet Take 2 tablets by mouth every 6 (six) hours as needed for headache.    at 1500  . ferrous sulfate 325 (65 FE) MG tablet Take 1 tablet (325 mg total) by mouth every other day. 30 tablet 0 11/07/2018 at Unknown time  . Prenat w/o A-FeCbGl-DSS-FA-DHA (CITRANATAL 90 DHA) 90-1 & 300 MG MISC Take 1 tablet by mouth daily. 30 each 11 11/07/2018 at Unknown time  . topiramate (TOPAMAX) 15 MG capsule Take 15 mg by mouth 2 (two) times daily.    at 1745  . albuterol (PROVENTIL HFA;VENTOLIN HFA) 108 (90 BASE) MCG/ACT inhaler Inhale 2 puffs into the lungs 4 (four) times daily. (Patient taking differently: Inhale  2 puffs into the lungs every 6 (six) hours as needed for wheezing or shortness of breath. ) 1 Inhaler 0 More than a month at Unknown time    Review of Systems  Constitutional: Negative for chills and fever.  Eyes: Negative for visual disturbance.  Respiratory: Negative for shortness of breath.   Cardiovascular: Negative for chest pain.  Gastrointestinal: Negative for abdominal pain, nausea and vomiting.  Neurological: Positive for headaches.   Physical Exam   Blood pressure (!) 164/84, pulse 61, temperature 98.5 F (36.9 C), resp. rate 16, height  (1.6 m), weight 68.9 kg, SpO2 100 %, currently breastfeeding.  Physical Exam  Nursing note and vitals reviewed. Constitutional: She is oriented to person, place, and time. She appears well-developed and well-nourished. She appears distressed (crying and visibly very uncomfortable ).  HENT:  Head: Normocephalic.  Cardiovascular: Normal rate.  Respiratory: Effort normal.  GI: Soft. There is no  abdominal tenderness.  Neurological: She is alert and oriented to person, place, and time. She displays abnormal reflex. She exhibits normal muscle tone (no clonus ).  Skin: Skin is warm and dry.  Psychiatric: She has a normal mood and affect.   Results for orders placed or performed during the hospital encounter of 11/07/18 (from the past 24 hour(s))  Comprehensive metabolic panel     Status: Abnormal   Collection Time: 11/07/18  7:03 PM  Result Value Ref Range   Sodium 143 135 - 145 mmol/L   Potassium 3.9 3.5 - 5.1 mmol/L   Chloride 106 98 - 111 mmol/L   CO2 30 22 - 32 mmol/L   Glucose, Bld 89 70 - 99 mg/dL   BUN 5 (L) 6 - 20 mg/dL   Creatinine, Ser 1.61 (H) 0.44 - 1.00 mg/dL   Calcium 9.8 8.9 - 09.6 mg/dL   Total Protein 7.4 6.5 - 8.1 g/dL   Albumin 3.3 (L) 3.5 - 5.0 g/dL   AST 24 15 - 41 U/L   ALT 20 0 - 44 U/L   Alkaline Phosphatase 126 38 - 126 U/L   Total Bilirubin 0.6 0.3 - 1.2 mg/dL   GFR calc non Af Amer >60 >60 mL/min   GFR calc Af Amer >60 >60 mL/min   Anion gap 7 5 - 15  CBC with Differential     Status: Abnormal   Collection Time: 11/07/18  7:03 PM  Result Value Ref Range   WBC 6.8 4.0 - 10.5 K/uL   RBC 4.33 3.87 - 5.11 MIL/uL   Hemoglobin 11.3 (L) 12.0 - 15.0 g/dL   HCT 04.5 40.9 - 81.1 %   MCV 85.7 80.0 - 100.0 fL   MCH 26.1 26.0 - 34.0 pg   MCHC 30.5 30.0 - 36.0 g/dL   RDW 91.4 (H) 78.2 - 95.6 %   Platelets 304 150 - 400 K/uL   nRBC 0.0 0.0 - 0.2 %   Neutrophils Relative % 56 %   Neutro Abs 3.8 1.7 - 7.7 K/uL   Lymphocytes Relative 34 %   Lymphs Abs 2.3 0.7 - 4.0 K/uL   Monocytes Relative 9 %   Monocytes Absolute 0.6 0.1 - 1.0 K/uL   Eosinophils Relative 0 %   Eosinophils Absolute 0.0 0.0 - 0.5 K/uL   Basophils Relative 0 %   Basophils Absolute 0.0 0.0 - 0.1 K/uL   Immature Granulocytes 1 %   Abs Immature Granulocytes 0.06 0.00 - 0.07 K/uL     MAU Course  Procedures  MDM  Labetalol  protocol started Dilaudid for pain Magnesium ordered   Likely pre-eclampsia with severe features due to headache and severe range blood pressure.  8:31 PM DW Dr. Emelda Fear, will admit to [facility] for magnesium and blood pressure management. Urine P:cr pending     Assessment and Plan   1. Hypertension in pregnancy, preeclampsia, severe, delivered/postpartum    Admit to [facility] DNP, CNM  11/07/18  8:19 PM

## 2018-11-07 NOTE — Progress Notes (Signed)
Pt admitted to obsc room 116. Pt on 2gm of Mag. Will continue to monitor carefully.

## 2018-11-07 NOTE — ED Triage Notes (Signed)
Pt arrives via pov with complaints of migraine with photophobia. Took her migraine medications at home with no relief.

## 2018-11-08 DIAGNOSIS — O1495 Unspecified pre-eclampsia, complicating the puerperium: Secondary | ICD-10-CM

## 2018-11-08 NOTE — MAU Note (Signed)
Misty Stanley -[place] Craige Cotta CNM aware of pt's elevated B/Ps and pt crying with headache pain

## 2018-11-08 NOTE — Progress Notes (Signed)
Patient ID: Natasha Martinez, female   DOB: [birth year], 23 y.o.   MRN: [id number] HD # 1 PPD # 7 Admitted for PP SPEC  Pt reports feeling much better this morning. HA has resolved, Denies Visual changes or epigastric pain. Tolerating diet. Voiding without problems.  PE AF BP 110'120's/60-70's Lungs clear Heart RRR Abd soft + BS U-2 non tender Ext non tender  A/P Stable. Will continue with magnesium x 24 hrs. BP stable without meds. Will continue to monitor BP. If remains stable consider discharge tomorrow.

## 2018-11-08 NOTE — Lactation Note (Signed)
Lactation Consultation Note  Patient Name: Natasha Martinez [id number] Date: 11/08/2018 Reason for consult: Follow-up assessment  1041 - 1045 - I visited Ms. Lussier to discuss her feeding plan and to offer assistance. She states that her plan is to pump and supplement by bottle using her expressed breast milk (EBM) as well as formula.  A symphony pump was set up in the room. She had no concerns or questions about how to use her DEBP, stating that it's "going well." She states that her milk volume is comparable to what she was producing prior to readmission.   Mom declines assistance and follow up. She states that she will request assistance as needed.   Maternal Data Formula Feeding for Exclusion: No  Feeding    LATCH Score                   Interventions    Lactation Tools Discussed/Used     Consult Status Consult Status: PRN    Walker Shadow 11/08/2018, 11:46 AM

## 2018-11-09 LAB — COMPREHENSIVE METABOLIC PANEL
ALT: 17 U/L (ref 0–44)
AST: 22 U/L (ref 15–41)
Albumin: 2.8 g/dL — ABNORMAL LOW (ref 3.5–5.0)
Alkaline Phosphatase: 99 U/L (ref 38–126)
Anion gap: 9 (ref 5–15)
BUN: 6 mg/dL (ref 6–20)
CO2: 25 mmol/L (ref 22–32)
Calcium: 7.7 mg/dL — ABNORMAL LOW (ref 8.9–10.3)
Chloride: 104 mmol/L (ref 98–111)
Creatinine, Ser: 0.89 mg/dL (ref 0.44–1.00)
GFR calc Af Amer: >60 mL/min (ref >60)
GFR calc non Af Amer: >60 mL/min (ref >60)
Glucose, Bld: 90 mg/dL (ref 70–99)
Potassium: 3.5 mmol/L (ref 3.5–5.1)
Sodium: 138 mmol/L (ref 135–145)
Total Bilirubin: 0.6 mg/dL (ref 0.3–1.2)
Total Protein: 6.4 g/dL — ABNORMAL LOW (ref 6.5–8.1)

## 2018-11-09 LAB — CBC
HCT: 36.6 % (ref 36.0–46.0)
Hemoglobin: 11.5 g/dL — ABNORMAL LOW (ref 12.0–15.0)
MCH: 26.7 pg (ref 26.0–34.0)
MCHC: 31.4 g/dL (ref 30.0–36.0)
MCV: 84.9 fL (ref 80.0–100.0)
Platelets: 304 10*3/uL (ref 150–400)
RBC: 4.31 MIL/uL (ref 3.87–5.11)
RDW: 17.2 % — ABNORMAL HIGH (ref 11.5–15.5)
WBC: 5.6 10*3/uL (ref 4.0–10.5)
nRBC: 0 % (ref 0.0–0.2)

## 2018-11-09 MED ORDER — ENALAPRIL MALEATE 5 MG PO TABS: 5.0000 mg | ORAL_TABLET | Freq: Every day | ORAL | 3 refills | Status: DC

## 2018-11-09 MED ORDER — POLYETHYLENE GLYCOL 3350 17 G PO PACK
17.0000 g | PACK | Freq: Once | ORAL | Status: AC
  Administered 2018-11-09: 17 g via ORAL
  Filled 2018-11-09: qty 1

## 2018-11-09 MED ORDER — ASPIRIN EC 81 MG PO TBEC: 81.0000 mg | DELAYED_RELEASE_TABLET | Freq: Every day | ORAL | 2 refills | Status: DC

## 2018-11-09 MED ORDER — ENALAPRIL MALEATE 5 MG PO TABS
5.0000 mg | ORAL_TABLET | Freq: Every day | ORAL | Status: DC
  Administered 2018-11-09: 5 mg via ORAL
  Filled 2018-11-09: qty 1

## 2018-11-09 NOTE — Discharge Summary (Signed)
Physician Discharge Summary  Patient ID: Natasha Martinez MRN: [id number] DOB/AGE: Mar 05, 1996 23 y.o.  Admit date: 11/07/2018 Discharge date: 11/09/2018  Admission Diagnoses:  Discharge Diagnoses:  Principal Problem:   Preeclampsia in postpartum period Active Problems:   Postpartum care following vaginal delivery   Discharged Condition: stable  Hospital Course: Patient was admitted with postpartum severe preeclampsia, given magnesium sulfate for 24 hours. BP observed, noted to be maximum 140s/90s after magnesium sulfate.  No other concerning symptoms.  She was started on Enalapril 5 mg daily. She has BP cuff at home, asked to check daily and record values.  She will get phone call from the office on 4/22 to ensure her BP is within range and that no adjustments need to be made to her medication. She will also get MD virtual visit around 11/17/18.   Consults: None  Significant Diagnostic Studies:  CBC Latest Ref Rng & Units 11/09/2018 11/07/2018 11/01/2018  WBC 4.0 - 10.5 K/uL 5.6 6.8 14.0(H)  Hemoglobin 12.0 - 15.0 g/dL 11.5(L) 11.3(L) 10.3(L)  Hematocrit 36.0 - 46.0 % 36.6 37.1 32.6(L)  Platelets 150 - 400 K/uL 304 304 253   CMP Latest Ref Rng & Units 11/09/2018 11/07/2018 09/15/2018  Glucose 70 - 99 mg/dL 90 89 87  BUN 6 - 20 mg/dL 6 5(L) 6  Creatinine 6.48 - 1.00 mg/dL 4.72 0.72(T) 8.28  Sodium 135 - 145 mmol/L 138 143 137  Potassium 3.5 - 5.1 mmol/L 3.5 3.9 3.6  Chloride 98 - 111 mmol/L 104 106 108  CO2 22 - 32 mmol/L 25 30 23   Calcium 8.9 - 10.3 mg/dL 7.7(L) 9.8 8.6(L)  Total Protein 6.5 - 8.1 g/dL 6.4(L) 7.4 6.0(L)  Total Bilirubin 0.3 - 1.2 mg/dL 0.6 0.6 8.3(D)  Alkaline Phos 38 - 126 U/L 99 126 80  AST 15 - 41 U/L 22 24 21   ALT 0 - 44 U/L 17 20 15    Discharge Exam: Blood pressure 134/89, pulse (!) 55, temperature 98.1 F (36.7 C), temperature source Oral, resp. rate 16, height 5\' 3"  (1.6 m), weight 68.9 kg, SpO2 100 %, currently breastfeeding. General appearance: alert and  no distress Resp: clear to auscultation bilaterally Cardio: regular rate and rhythm, S1, S2 normal, no murmur, click, rub or gallop GI: soft, non-tender; bowel sounds normal; no masses,  no organomegaly Pelvic: deferred Extremities: extremities normal, atraumatic, no cyanosis or edema, Homans sign is negative, no sign of DVT and no edema, redness or tenderness in the calves or thighs  Discharge disposition: 01-Home or Self Care  Discharge Instructions    Discharge patient   Complete by:  As directed    Discharge disposition:  01-Home or Self Care   Discharge patient date:  11/09/2018     Allergies as of 11/09/2018   No Known Allergies     Medication List    STOP taking these medications   aspirin-acetaminophen-caffeine 250-250-65 MG tablet Commonly known as:  EXCEDRIN MIGRAINE     TAKE these medications   acetaminophen 325 MG tablet Commonly known as:  TYLENOL Take 500 mg by mouth every 6 (six) hours as needed for mild pain.   albuterol 108 (90 Base) MCG/ACT inhaler Commonly known as:  VENTOLIN HFA Inhale 2 puffs into the lungs 4 (four) times daily. What changed:    when to take this  reasons to take this   aspirin EC 81 MG tablet Take 1 tablet (81 mg total) by mouth daily.   CitraNatal 90 DHA 90-1 & 300 MG Misc Take  1 tablet by mouth daily.   enalapril 5 MG tablet Commonly known as:  VASOTEC Take 1 tablet (5 mg total) by mouth daily. Start taking on:  November 10, 2018   ferrous sulfate 325 (65 FE) MG tablet Take 1 tablet (325 mg total) by mouth every other day.   topiramate 15 MG capsule Commonly known as:  TOPAMAX Take 15 mg by mouth 2 (two) times daily.      Follow-up Information    [facility] up on 11/12/2018.   Specialty:  Obstetrics and Gynecology Why:  You will be called by office on 11/12/18 and 11/17/18 to check in. Contact information: [address], [address] [id number] [place]  [place] [id number] [phone]          Signed: Jaynie CollinsUgonna Ladawna Walgren, MD 11/09/2018, 11:03 AM

## 2018-11-11 ENCOUNTER — Inpatient Hospital Stay (HOSPITAL_COMMUNITY)
Admission: AD | Admit: 2018-11-11 | Discharge: 2018-11-11 | Disposition: A | Discharge status: Home / Self Care | Payer: Medicaid Other | Attending: Obstetrics & Gynecology | Admitting: Obstetrics & Gynecology

## 2018-11-11 ENCOUNTER — Encounter (HOSPITAL_COMMUNITY): Payer: Self-pay | Admitting: *Deleted

## 2018-11-11 ENCOUNTER — Other Ambulatory Visit: Payer: Self-pay

## 2018-11-11 DIAGNOSIS — O1495 Unspecified pre-eclampsia, complicating the puerperium: Secondary | ICD-10-CM | POA: Diagnosis not present

## 2018-11-11 DIAGNOSIS — O9089 Other complications of the puerperium, not elsewhere classified: Secondary | ICD-10-CM | POA: Diagnosis not present

## 2018-11-11 DIAGNOSIS — Z7982 Long term (current) use of aspirin: Secondary | ICD-10-CM | POA: Insufficient documentation

## 2018-11-11 DIAGNOSIS — Z79899 Other long term (current) drug therapy: Secondary | ICD-10-CM | POA: Insufficient documentation

## 2018-11-11 DIAGNOSIS — Z87891 Personal history of nicotine dependence: Secondary | ICD-10-CM | POA: Diagnosis not present

## 2018-11-11 DIAGNOSIS — O165 Unspecified maternal hypertension, complicating the puerperium: Secondary | ICD-10-CM | POA: Diagnosis not present

## 2018-11-11 DIAGNOSIS — G43909 Migraine, unspecified, not intractable, without status migrainosus: Secondary | ICD-10-CM | POA: Insufficient documentation

## 2018-11-11 DIAGNOSIS — J45909 Unspecified asthma, uncomplicated: Secondary | ICD-10-CM | POA: Diagnosis not present

## 2018-11-11 LAB — COMPREHENSIVE METABOLIC PANEL
ALT: 15 U/L (ref 0–44)
AST: 20 U/L (ref 15–41)
Albumin: 3.3 g/dL — ABNORMAL LOW (ref 3.5–5.0)
Alkaline Phosphatase: 92 U/L (ref 38–126)
Anion gap: 13 (ref 5–15)
BUN: 13 mg/dL (ref 6–20)
CO2: 22 mmol/L (ref 22–32)
Calcium: 9.6 mg/dL (ref 8.9–10.3)
Chloride: 104 mmol/L (ref 98–111)
Creatinine, Ser: 1.02 mg/dL — ABNORMAL HIGH (ref 0.44–1.00)
GFR calc Af Amer: >60 mL/min (ref >60)
GFR calc non Af Amer: >60 mL/min (ref >60)
Glucose, Bld: 94 mg/dL (ref 70–99)
Potassium: 3.9 mmol/L (ref 3.5–5.1)
Sodium: 139 mmol/L (ref 135–145)
Total Bilirubin: 0.5 mg/dL (ref 0.3–1.2)
Total Protein: 7 g/dL (ref 6.5–8.1)

## 2018-11-11 LAB — CBC
HCT: 37.5 % (ref 36.0–46.0)
Hemoglobin: 11.8 g/dL — ABNORMAL LOW (ref 12.0–15.0)
MCH: 26.9 pg (ref 26.0–34.0)
MCHC: 31.5 g/dL (ref 30.0–36.0)
MCV: 85.6 fL (ref 80.0–100.0)
Platelets: 313 10*3/uL (ref 150–400)
RBC: 4.38 MIL/uL (ref 3.87–5.11)
RDW: 16.7 % — ABNORMAL HIGH (ref 11.5–15.5)
WBC: 7.4 10*3/uL (ref 4.0–10.5)
nRBC: 0 % (ref 0.0–0.2)

## 2018-11-11 MED ORDER — NIFEDIPINE 10 MG PO CAPS
20.0000 mg | ORAL_CAPSULE | ORAL | Status: DC | PRN
  Administered 2018-11-11: 20 mg via ORAL
  Filled 2018-11-11: qty 2

## 2018-11-11 MED ORDER — LABETALOL HCL 5 MG/ML IV SOLN: 40.0000 mg | INTRAVENOUS | Status: DC | PRN

## 2018-11-11 MED ORDER — NIFEDIPINE 10 MG PO CAPS
10.0000 mg | ORAL_CAPSULE | ORAL | Status: DC | PRN
  Administered 2018-11-11: 10 mg via ORAL
  Filled 2018-11-11: qty 1

## 2018-11-11 MED ORDER — NIFEDIPINE 10 MG PO CAPS: 20.0000 mg | ORAL_CAPSULE | ORAL | Status: DC | PRN

## 2018-11-11 MED ORDER — NIFEDIPINE ER OSMOTIC RELEASE 30 MG PO TB24: 30.0000 mg | ORAL_TABLET | Freq: Every day | ORAL | 1 refills | Status: DC

## 2018-11-11 NOTE — MAU Provider Note (Signed)
History     CSN: [id number]  Arrival date and time: 11/11/18 [id number]   First Provider Initiated Contact with Patient 11/11/18 [phone]      Chief Complaint  Patient presents with  . Postpartum Complications  . Hypertension  . Headache   Natasha Martinez is a 23 y.o. G1P1001 at 10 days postpartum who presents for Postpartum Complications; Hypertension; and Headache.  She reports her headache started yesterday morning before taking her medicine at 0800.  She reports taking vasotec and aspirin daily.  She reports that upon arrival the headache was 10/10, but after dosing with procardia it is now 2/10.  She reports that her bp was elevated last night, but it was normal thorughout the day as she reports taking it every 4 hours. She denies issues with bleeding, other pain, and N/V.      OB History    Gravida  1   Para  1   Term  1   Preterm      AB      Living  1     SAB      TAB      Ectopic      Multiple  0   Live Births  1           Past Medical History:  Diagnosis Date  . Asthma    hasn't used inhaler in years  . Migraine   . Seizures (HCC)    as a child, last time when 89 yrs old    Past Surgical History:  Procedure Laterality Date  . Wisdom tooth removal      Family History  Problem Relation Age of Onset  . Healthy Mother   . Healthy Father     Social History   Tobacco Use  . Smoking status: Former Smoker    Types: Cigars    Last attempt to quit: 01/2018    Years since quitting: 0.8  . Smokeless tobacco: Never Used  Substance Use Topics  . Alcohol use: No  . Drug use: No    Allergies: No Known Allergies  Medications Prior to Admission  Medication Sig Dispense Refill Last Dose  . acetaminophen (TYLENOL) 325 MG tablet Take 500 mg by mouth every 6 (six) hours as needed for mild pain.    11/07/2018 at Unknown time  . albuterol (PROVENTIL HFA;VENTOLIN HFA) 108 (90 BASE) MCG/ACT inhaler Inhale 2 puffs into the lungs 4 (four) times daily. (Patient  taking differently: Inhale 2 puffs into the lungs every 6 (six) hours as needed for wheezing or shortness of breath. ) 1 Inhaler 0 More than a month at Unknown time  . aspirin EC 81 MG tablet Take 1 tablet (81 mg total) by mouth daily. 30 tablet 2   . enalapril (VASOTEC) 5 MG tablet Take 1 tablet (5 mg total) by mouth daily. 30 tablet 3   . ferrous sulfate 325 (65 FE) MG tablet Take 1 tablet (325 mg total) by mouth every other day. 30 tablet 0 11/07/2018 at Unknown time  . Prenat w/o A-FeCbGl-DSS-FA-DHA (CITRANATAL 90 DHA) 90-1 & 300 MG MISC Take 1 tablet by mouth daily. 30 each 11 11/07/2018 at Unknown time  . topiramate (TOPAMAX) 15 MG capsule Take 15 mg by mouth 2 (two) times daily.    at 1745    Review of Systems  Constitutional: Negative for chills and fever.  Respiratory: Negative for cough and shortness of breath.   Gastrointestinal: Negative for abdominal pain, constipation, diarrhea, nausea and  vomiting.  Genitourinary: Negative for dysuria, vaginal bleeding and vaginal discharge.  Neurological: Positive for light-headedness and headaches. Negative for dizziness.   Physical Exam   Blood pressure (!) 166/109, pulse 72, temperature 98.5 F (36.9 C), temperature source Oral, resp. rate 20, SpO2 99 %, currently breastfeeding. Vitals:   11/11/18 0830 11/11/18 0845 11/11/18 0900 11/11/18 0915  BP: (!) 85/41 (!) 86/42 (!) 95/51 (!) 99/52  Pulse: 87 95 81 93  Resp:    18  Temp:    98.1 F (36.7 C)  TempSrc:    Oral  SpO2: 100% 98%  97%    Physical Exam  Constitutional: She is oriented to person, place, and time. She appears well-developed and well-nourished. No distress.  HENT:  Head: Normocephalic and atraumatic.  Eyes: Conjunctivae are normal.  Neck: Normal range of motion.  Cardiovascular: Normal rate.  Respiratory: Effort normal.  GI: Soft.  Musculoskeletal: Normal range of motion.  Neurological: She is alert and oriented to person, place, and time.  Skin: Skin is warm and  dry.  Psychiatric: She has a normal mood and affect. Her behavior is normal.    MAU Course  Procedures   MDM Oral Antihypertensives Observation Medication Modification Assessment and Plan  10 days Postpartum Migraine Elevated BP S/P PP MgSO4  -Patient with great improvement in BP and HA since procardia dosing. -Discussed modifying medication to procardia 30mg  XL daily -Instructed to continue to take bp every 4 hours and report abnormally low or high results. -Discussed need for follow up, via televist, in one week for review of bps. -Patient without questions or concerns. -Will observe bp for 3-4 more cycles.  Follow Up (9:08 AM)  -Dr. Earlene Plater consulted and agrees with plan as above with suggestion that patient follow up for bp check on Friday. -Further instructed to observe patient until bp improves  -Patient resting in room, appears asleep. -Blood pressure noted to be 95/51. -Discharge orders placed. -Will send note to [facility] office for follow up as suggested. -Encouraged to call or return to MAU if symptoms worsen or with the onset of new symptoms. -Discharged to home in stable condition.  Cherre Robins MSN, CNM 11/11/2018, 8:18 AM

## 2018-11-11 NOTE — MAU Note (Signed)
Pt reports high BP and HA. States when she checked her BP at 5a, her BP was 160/95. Pt reports she took Tylenol for HA around 4am, has not helped. Reports the HA is worse when she sits or lies down. Pt is visibly upset and crying in triage.

## 2018-11-11 NOTE — Progress Notes (Signed)
Pt constantly moving while BP being taken, reports can't sit still.  Will retake BP in 15 minutes & report elevation to AP.

## 2018-11-12 ENCOUNTER — Telehealth: Payer: Self-pay | Admitting: Obstetrics & Gynecology

## 2018-11-12 NOTE — Telephone Encounter (Signed)
Attempted to call patient to get her scheduled to speak with a nurse to have her bp check and discuss medication. Patient has a bp cuff at home. No answer, left message with appointment time and date and informed that this will be a telephone visit.

## 2018-11-14 ENCOUNTER — Ambulatory Visit: Payer: Medicaid Other | Admitting: *Deleted

## 2018-11-14 ENCOUNTER — Other Ambulatory Visit: Payer: Self-pay

## 2018-11-14 DIAGNOSIS — Z0130 Encounter for examination of blood pressure without abnormal findings: Secondary | ICD-10-CM

## 2018-11-14 NOTE — Progress Notes (Signed)
Called pt to check pt's BP and her compliance with her medication.  Pt did not pick up.  Left voicemail advising pt that she was being contacted in regard to her appointment and to please contact the clinic.     Called pt a second time.  Pt did not pick up.  Left voicemail advising pt that she was being contacted in regards to her appointment and to please contact the clinic.

## 2018-11-19 ENCOUNTER — Telehealth: Payer: Self-pay | Admitting: Family Medicine

## 2018-11-19 NOTE — Telephone Encounter (Signed)
Attempted to call patient with her virtual postpartum visit. No answer, left appointment (5/11 @ 1035) information as well app information for her appointment. Advised to give the office a call if needing to reschedule.

## 2018-12-01 ENCOUNTER — Other Ambulatory Visit: Payer: Self-pay

## 2018-12-01 ENCOUNTER — Ambulatory Visit (INDEPENDENT_AMBULATORY_CARE_PROVIDER_SITE_OTHER): Payer: Medicaid Other

## 2018-12-01 NOTE — Progress Notes (Signed)
TELEHEALTH VIRTUAL POSTPARTUM VISIT ENCOUNTER NOTE  I connected with@ on 12/01/18 at 10:35 AM EDT by WebEx at home and verified that I am speaking with the correct person using two identifiers.   I discussed the limitations, risks, security and privacy concerns of performing an evaluation and management service by telephone and the availability of in person appointments. I also discussed with the patient that there may be a patient responsible charge related to this service. The patient expressed understanding and agreed to proceed.  Appointment Date: 12/01/2018  [facility]  Chief Complaint:  Chief Complaint  Patient presents with  . Postpartum Care    History of Present Illness: Natasha Martinez is a 23 y.o. African-American G1P1001 (No LMP recorded.), seen for the above chief complaint. Her past medical history is significant for migraines and preeclampsia.   She is s/p normal spontaneous vaginal delivery on 4/11 at 40 weeks 5 days; she was discharged to home on PPD#1. Pregnancy complicated by postpartum preeclampsia with readmission. Baby is doing well.  Complains of n/a  Vaginal bleeding or discharge: No  Mode of feeding infant: Bottle Intercourse: No  Contraception: oral contraceptives (estrogen/progesterone) PP depression s/s: No .  Any bowel or bladder issues: No  Pap smear: no abnormalities (date: 11/19)  Review of Systems: Positive for nothing. Her 12 point review of systems is negative or as noted in the History of Present Illness.  Patient Active Problem List   Diagnosis Date Noted  . Preeclampsia in postpartum period 11/07/2018  . Anemia affecting pregnancy in third trimester 09/16/2018  . Postpartum care following vaginal delivery 05/23/2018  . Asthma in adult, mild intermittent, uncomplicated 05/23/2018  . Chronic migraine w/o aura w/o status migrainosus, not intractable 08/04/2015  . History of seizures as a child 08/04/2015    Medications [name] had no medications administered during this visit. Current Outpatient Medications  Medication Sig Dispense Refill  . albuterol (PROVENTIL HFA;VENTOLIN HFA) 108 (90 BASE) MCG/ACT inhaler Inhale 2 puffs into the lungs 4 (four) times daily. (Patient taking differently: Inhale 2 puffs into the lungs every 6 (six) hours as needed for wheezing or shortness of breath. ) 1 Inhaler 0  . aspirin EC 81 MG tablet Take 1 tablet (81 mg total) by mouth daily. 30 tablet 2  . ferrous sulfate 325 (65 FE) MG tablet Take 1 tablet (325 mg total) by mouth every other day. 30 tablet 0  . NIFEdipine (PROCARDIA-XL/NIFEDICAL-XL) 30 MG 24 hr tablet Take 1 tablet (30 mg total) by mouth daily. 30 tablet 1  . Prenat w/o A-FeCbGl-DSS-FA-DHA (CITRANATAL 90 DHA) 90-1 & 300 MG MISC Take 1 tablet by mouth daily. 30 each 11  . enalapril (VASOTEC) 5 MG tablet Take 1 tablet (5 mg total) by mouth daily. 30 tablet 3  . topiramate (TOPAMAX) 15 MG capsule Take 15 mg by mouth 2 (two) times daily.     No current facility-administered medications for this visit.     Allergies Patient has no known allergies.  Physical Exam:  General:  Alert, oriented and cooperative.   Mental Status: Normal mood and affect perceived. Normal judgment and thought content.  Rest of physical exam deferred due to type of encounter  PP Depression Screening:   Edinburgh Postnatal Depression Scale - 12/01/18 1021      Edinburgh Postnatal Depression Scale:  In the Past 7 Days   I have been able to laugh and see the funny side of things.  0  I have looked forward with enjoyment to things.  0    I have blamed myself unnecessarily when things went wrong.  0    I have been anxious or worried for no good reason.  0    I have felt scared or panicky for no good reason.  0    Things have been getting on top of me.  0    I have been so unhappy that I have had difficulty sleeping.  0    I have felt sad or miserable.  0    I have been so unhappy that I  have been crying.  0    The thought of harming myself has occurred to me.  0    Edinburgh Postnatal Depression Scale Total  0       Assessment:Patient is a 23 y.o. G1P1001 who is 4 weeks postpartum from a normal spontaneous vaginal delivery with readmission for postpartum preeclampsia.  She is doing well.   Plan:  1. Postpartum care and examination -No complaints. Routine care -BP 116/87. Patient reports all BPs within normal range on procardia 30mg  XL -Discussed patient continuing BP medication and monitoring BPs until 6 weeks PP. At that time, can discontinue if BPs remain normal. Discussed need to establish care with PCP if BP return to elevated state without medication.  -Patient desires OCPs for contraception. Used in the past with success. Discussed not starting until 6 weeks PP and patient verbalized understanding of risks.    RTC 1 year or sooner if needed  I discussed the assessment and treatment plan with the patient. The patient was provided an opportunity to ask questions and all were answered. The patient agreed with the plan and demonstrated an understanding of the instructions.   The patient was advised to call back or seek an in-person evaluation/go to the ED for any concerning postpartum symptoms.  I provided 15 minutes of non-face-to-face time during this encounter.   Rolm Bookbinderaroline M Trevious Rampey, CNM [facility], [facility]

## 2018-12-11 ENCOUNTER — Other Ambulatory Visit: Payer: Self-pay

## 2018-12-11 MED ORDER — ALBUTEROL SULFATE HFA 108 (90 BASE) MCG/ACT IN AERS: 2.0000 | INHALATION_SPRAY | Freq: Four times a day (QID) | RESPIRATORY_TRACT | 1 refills | Status: DC | PRN

## 2018-12-11 MED ORDER — NORGESTIMATE-ETH ESTRADIOL 0.25-35 MG-MCG PO TABS: 1.0000 | ORAL_TABLET | Freq: Every day | ORAL | 11 refills | Status: DC

## 2019-01-31 ENCOUNTER — Other Ambulatory Visit: Payer: Self-pay | Admitting: Obstetrics & Gynecology

## 2019-03-12 ENCOUNTER — Telehealth: Payer: Self-pay | Admitting: Medical

## 2019-03-12 NOTE — Telephone Encounter (Signed)
Attempted to reach patient about her appointment. Left a voicemail message for to call the office.

## 2019-03-24 ENCOUNTER — Encounter (HOSPITAL_COMMUNITY): Payer: Self-pay

## 2019-03-24 ENCOUNTER — Inpatient Hospital Stay: Admission: RE | Admit: 2019-03-24 | Payer: Medicaid Other | Source: Ambulatory Visit

## 2019-03-24 ENCOUNTER — Ambulatory Visit (INDEPENDENT_AMBULATORY_CARE_PROVIDER_SITE_OTHER)
Admission: RE | Admit: 2019-03-24 | Discharge: 2019-03-24 | Disposition: A | Discharge status: Home / Self Care | Payer: Medicaid Other | Source: Ambulatory Visit

## 2019-03-24 ENCOUNTER — Telehealth: Payer: Medicaid Other

## 2019-03-24 DIAGNOSIS — M545 Low back pain, unspecified: Secondary | ICD-10-CM

## 2019-03-24 MED ORDER — NAPROXEN 375 MG PO TABS: 375.0000 mg | ORAL_TABLET | Freq: Two times a day (BID) | ORAL | 0 refills | Status: DC

## 2019-03-24 MED ORDER — CYCLOBENZAPRINE HCL 10 MG PO TABS: 10.0000 mg | ORAL_TABLET | Freq: Every day | ORAL | 0 refills | Status: DC

## 2019-03-24 NOTE — Discharge Instructions (Signed)
Continue conservative management of rest, ice, heat, and gentle stretches Take naproxen as needed for pain relief (may cause abdominal discomfort, ulcers, and GI bleeds avoid taking with other NSAIDs) Take cyclobenzaprine at nighttime for symptomatic relief. Avoid driving or operating heavy machinery while using medication. Follow up in person or with PCP if symptoms persist Follow up in person or go to the ER if you have any new or worsening symptoms (fever, chills, chest pain, abdominal pain, changes in bowel or bladder habits, pain radiating into lower legs, etc...)

## 2019-03-24 NOTE — ED Provider Notes (Signed)
[facility] Virtual Visit via Video Note:  Natasha Martinez  initiated request for Telemedicine visit with [facility] team. I connected with Natasha Martinez  on 03/24/2019 at 12:09 PM  for a synchronized telemedicine visit using a video enabled HIPPA compliant telemedicine application. I verified that I am speaking with Natasha Martinez  using two identifiers. Natasha HardingBrittany Marks Scalera, PA-C  was physically located in a [facility] site and Natasha Martinez was located at a different location.   The limitations of evaluation and management by telemedicine as well as the availability of in-person appointments were discussed. Patient was informed that she  may incur a bill ( including co-pay) for this virtual visit encounter. Natasha Martinez  expressed understanding and gave verbal consent to proceed with virtual visit.   [id number] 03/24/19 Arrival Time: 1059  CC: Back PAIN  SUBJECTIVE: History from: patient. Natasha Martinez is a 23 y.o. female complains of intermittent low back x 4 months, with recent flare up yesterday.  Denies a precipitating event or specific injury.  Localizes the pain to the low and upper back.  Describes the pain as constant and achy, and sharp in character.  Has tried OTC tylenol without relief.  Denies aggravating factors.  Denies similar symptoms in the past.  Denies fever, chills, nausea, vomiting, erythema, ecchymosis, effusion, weakness, numbness and tingling, saddle paresthesias, loss of bowel or bladder function.      ROS: As per HPI.  All other pertinent ROS negative.     Past Medical History:  Diagnosis Date  . Asthma    hasn't used inhaler in years  . Migraine   . Seizures (HCC)    as a child, last time when 89 yrs old   Past Surgical History:  Procedure Laterality Date  . Wisdom tooth removal     No Known Allergies No current facility-administered medications on file prior to encounter.    Current Outpatient Medications on File Prior to  Encounter  Medication Sig Dispense Refill  . [DISCONTINUED] albuterol (VENTOLIN HFA) 108 (90 Base) MCG/ACT inhaler Inhale 2 puffs into the lungs every 6 (six) hours as needed for wheezing or shortness of breath. 1 Inhaler 1  . [DISCONTINUED] aspirin EC 81 MG tablet Take 1 tablet (81 mg total) by mouth daily. 30 tablet 2  . [DISCONTINUED] enalapril (VASOTEC) 5 MG tablet Take 1 tablet (5 mg total) by mouth daily. 30 tablet 3  . [DISCONTINUED] ferrous sulfate 325 (65 FE) MG tablet Take 1 tablet (325 mg total) by mouth every other day. 30 tablet 0  . [DISCONTINUED] NIFEdipine (PROCARDIA-XL/NIFEDICAL-XL) 30 MG 24 hr tablet Take 1 tablet (30 mg total) by mouth daily. 30 tablet 1  . [DISCONTINUED] norgestimate-ethinyl estradiol (ORTHO-CYCLEN) 0.25-35 MG-MCG tablet Take 1 tablet by mouth daily. 1 Package 11    OBJECTIVE:  There were no vitals filed for this visit.  General appearance: alert; no distress Eyes: EOMI grossly HENT: normocephalic; atraumatic Neck: supple with FROM Lungs: normal respiratory effort; speaking in full sentences without difficulty Extremities: moves extremities without difficulty Skin: No obvious rashes Neurologic: No facial asymmetries Psychological: alert and cooperative; normal mood and affect   ASSESSMENT & PLAN:  1. Acute midline low back pain without sciatica     Meds ordered this encounter  Medications  . naproxen (NAPROSYN) 375 MG tablet    Sig: Take 1 tablet (375 mg total) by mouth 2 (two) times daily.    Dispense:  20 tablet    Refill:  0  Order Specific Question:   Supervising Provider    Answer:   Raylene Everts [[id number]]  . cyclobenzaprine (FLEXERIL) 10 MG tablet    Sig: Take 1 tablet (10 mg total) by mouth at bedtime.    Dispense:  12 tablet    Refill:  0    Order Specific Question:   Supervising Provider    Answer:   Raylene Everts [[id number]]    Continue conservative management of rest, ice, heat, and gentle stretches Take naproxen  as needed for pain relief (may cause abdominal discomfort, ulcers, and GI bleeds avoid taking with other NSAIDs) Take cyclobenzaprine at nighttime for symptomatic relief. Avoid driving or operating heavy machinery while using medication. Follow up in person or with PCP if symptoms persist Follow up in person or go to the ER if you have any new or worsening symptoms (fever, chills, chest pain, abdominal pain, changes in bowel or bladder habits, pain radiating into lower legs, etc...)   I discussed the assessment and treatment plan with the patient. The patient was provided an opportunity to ask questions and all were answered. The patient agreed with the plan and demonstrated an understanding of the instructions.   The patient was advised to call back or seek an in-person evaluation if the symptoms worsen or if the condition fails to improve as anticipated.  I provided 7 minutes of non-face-to-face time during this encounter.  Lestine Box, PA-C  03/24/2019 12:09 PM     Lestine Box, PA-C 03/24/19 1209

## 2019-03-25 ENCOUNTER — Ambulatory Visit: Payer: Medicaid Other | Admitting: Obstetrics & Gynecology

## 2019-04-07 ENCOUNTER — Telehealth: Payer: Self-pay | Admitting: Obstetrics & Gynecology

## 2019-04-07 NOTE — Telephone Encounter (Signed)
Called the patient to inform of the upcoming appointment. Left a detailed voicemail inform the patient if she has been diagnosed with covid, in close contact with someone who has had covid, or experienced any flu-like symptoms in the past 14 days please give our office a call to reschedule. Also notified due to Covid19 restriction no children or visitors are allowed. °

## 2019-04-08 ENCOUNTER — Ambulatory Visit: Payer: Medicaid Other | Admitting: Medical

## 2019-06-03 DIAGNOSIS — Z30013 Encounter for initial prescription of injectable contraceptive: Secondary | ICD-10-CM | POA: Diagnosis not present

## 2019-06-03 DIAGNOSIS — Z3202 Encounter for pregnancy test, result negative: Secondary | ICD-10-CM | POA: Diagnosis not present

## 2019-06-17 ENCOUNTER — Other Ambulatory Visit: Payer: Self-pay

## 2019-06-17 DIAGNOSIS — Z20828 Contact with and (suspected) exposure to other viral communicable diseases: Secondary | ICD-10-CM | POA: Diagnosis not present

## 2019-06-17 DIAGNOSIS — Z20822 Contact with and (suspected) exposure to covid-19: Secondary | ICD-10-CM

## 2019-06-19 LAB — NOVEL CORONAVIRUS, NAA: SARS-CoV-2, NAA: NOT DETECTED

## 2019-07-03 ENCOUNTER — Other Ambulatory Visit: Payer: Self-pay

## 2019-07-03 DIAGNOSIS — Z20828 Contact with and (suspected) exposure to other viral communicable diseases: Secondary | ICD-10-CM | POA: Diagnosis not present

## 2019-07-03 DIAGNOSIS — Z20822 Contact with and (suspected) exposure to covid-19: Secondary | ICD-10-CM

## 2019-07-04 LAB — NOVEL CORONAVIRUS, NAA: SARS-CoV-2, NAA: NOT DETECTED

## 2019-07-12 ENCOUNTER — Other Ambulatory Visit (INDEPENDENT_AMBULATORY_CARE_PROVIDER_SITE_OTHER): Payer: Self-pay

## 2019-07-12 ENCOUNTER — Telehealth: Payer: Medicaid Other | Admitting: Family

## 2019-07-12 DIAGNOSIS — M545 Low back pain, unspecified: Secondary | ICD-10-CM

## 2019-07-12 MED ORDER — NAPROXEN 500 MG PO TABS: 500.0000 mg | ORAL_TABLET | Freq: Two times a day (BID) | ORAL | 0 refills | Status: DC

## 2019-07-12 MED ORDER — BACLOFEN 10 MG PO TABS: 10.0000 mg | ORAL_TABLET | Freq: Three times a day (TID) | ORAL | 0 refills | Status: DC

## 2019-07-12 NOTE — Progress Notes (Signed)

## 2019-08-18 DIAGNOSIS — Z3042 Encounter for surveillance of injectable contraceptive: Secondary | ICD-10-CM | POA: Diagnosis not present

## 2019-11-18 ENCOUNTER — Ambulatory Visit (INDEPENDENT_AMBULATORY_CARE_PROVIDER_SITE_OTHER)
Admission: RE | Admit: 2019-11-18 | Discharge: 2019-11-18 | Disposition: A | Discharge status: Home / Self Care | Payer: Medicaid Other | Source: Ambulatory Visit

## 2019-11-18 DIAGNOSIS — R110 Nausea: Secondary | ICD-10-CM

## 2019-11-18 DIAGNOSIS — R197 Diarrhea, unspecified: Secondary | ICD-10-CM | POA: Diagnosis not present

## 2019-11-18 MED ORDER — ONDANSETRON HCL 4 MG PO TABS: 4.0000 mg | ORAL_TABLET | Freq: Four times a day (QID) | ORAL | 0 refills | Status: DC | PRN

## 2019-11-18 NOTE — Discharge Instructions (Signed)
Follow the diarrhea diet suggested.  Take the Zofran as needed for nausea.  Keep yourself hydrated.    Follow up with your primary care provider or come here to be seen in person if your symptoms are not improving.

## 2019-11-18 NOTE — ED Provider Notes (Signed)
Virtual Visit via Video Note:  Natasha Martinez  initiated request for Telemedicine visit with [facility] team. I connected with Natasha Martinez  on 11/18/2019 at 11:39 AM  for a synchronized telemedicine visit using a video enabled HIPPA compliant telemedicine application. I verified that I am speaking with Natasha Martinez  using two identifiers. Natasha Bail, NP  was physically located in a [facility]gent care site and Natasha Martinez was located at a different location.   The limitations of evaluation and management by telemedicine as well as the availability of in-person appointments were discussed. Patient was informed that she  may incur a bill ( including co-pay) for this virtual visit encounter. Natasha Martinez  expressed understanding and gave verbal consent to proceed with virtual visit.     History of Present Illness:Natasha Martinez  is a 24 y.o. female presents for evaluation of diarrhea, nausea, and epigastric abdominal pain since last night.  No injury.  No fever, chills, vomiting, rash, cough, shortness of breath.   She reports the epigastric abdominal pain is 9/10, sharp, intermittent, only occurs when having a bowel movement.  She is drinking fluids without difficulty.  Approximately 5 episodes of diarrhea since symptoms began.  She has two co-workers with similar symptoms.     No Known Allergies   Past Medical History:  Diagnosis Date  . Asthma    hasn't used inhaler in years  . Migraine   . Seizures (HCC)    as a child, last time when 24 yrs old     Social History   Tobacco Use  . Smoking status: Former Smoker    Types: Cigars    Quit date: 01/2018    Years since quitting: 1.8  . Smokeless tobacco: Never Used  Substance Use Topics  . Alcohol use: No  . Drug use: No   ROS: as stated in HPI.  All other systems reviewed and negative.      Observations/Objective: Physical Exam  VITALS: Patient denies fever. GENERAL: Alert, appears well and in no acute  distress. HEENT: Atraumatic. NECK: Normal movements of the head and neck. CARDIOPULMONARY: No increased WOB. Speaking in clear sentences. I:E ratio WNL.  MS: Moves all visible extremities without noticeable abnormality. PSYCH: Pleasant and cooperative, well-groomed. Speech normal rate and rhythm. Affect is appropriate. Insight and judgement are appropriate. Attention is focused, linear, and appropriate.  NEURO: CN grossly intact. Oriented as arrived to appointment on time with no prompting. Moves both UE equally.  SKIN: No obvious lesions, wounds, erythema, or cyanosis noted on face or hands.   Assessment and Plan:    ICD-10-CM   1. Diarrhea, unspecified type  R19.7   2. Nausea without vomiting  R11.0        Follow Up Instructions: Patient is well-appearing.  Treating nausea with Zofran.  Discussed BRAT diet for diarrhea.  Discussed that she will need to come for an in person visit if she is not able to stay hydrated at home, has worsening symptoms, develops a fever, or has other concerns.  Patient agrees to plan of care.    I discussed the assessment and treatment plan with the patient. The patient was provided an opportunity to ask questions and all were answered. The patient agreed with the plan and demonstrated an understanding of the instructions.   The patient was advised to call back or seek an in-person evaluation if the symptoms worsen or if the condition fails to improve as anticipated.  Sharion Balloon, NP  11/18/2019 11:39 AM         Sharion Balloon, NP 11/18/19 1139

## 2020-02-18 ENCOUNTER — Telehealth: Payer: Medicaid Other

## 2020-02-18 DIAGNOSIS — R070 Pain in throat: Secondary | ICD-10-CM | POA: Diagnosis not present

## 2020-02-18 DIAGNOSIS — R0602 Shortness of breath: Secondary | ICD-10-CM | POA: Diagnosis not present

## 2020-02-18 DIAGNOSIS — Z20822 Contact with and (suspected) exposure to covid-19: Secondary | ICD-10-CM | POA: Diagnosis not present

## 2020-02-19 DIAGNOSIS — R001 Bradycardia, unspecified: Secondary | ICD-10-CM | POA: Diagnosis not present

## 2020-06-16 IMAGING — US US MFM OB FOLLOW UP
1 series · 14 of 28 positions shown · non-contrast
Comparison: none

[Series 1: us mfm ob follow up · 29 acquisitions, 14 frames shown]
[im 2/29]
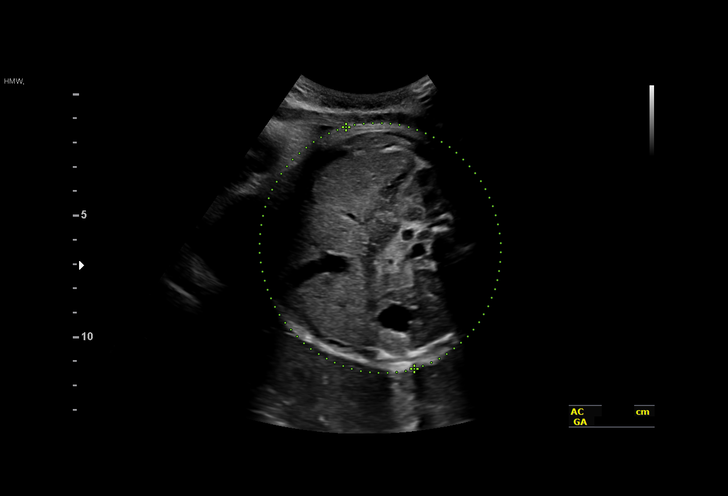
[im 4/29]
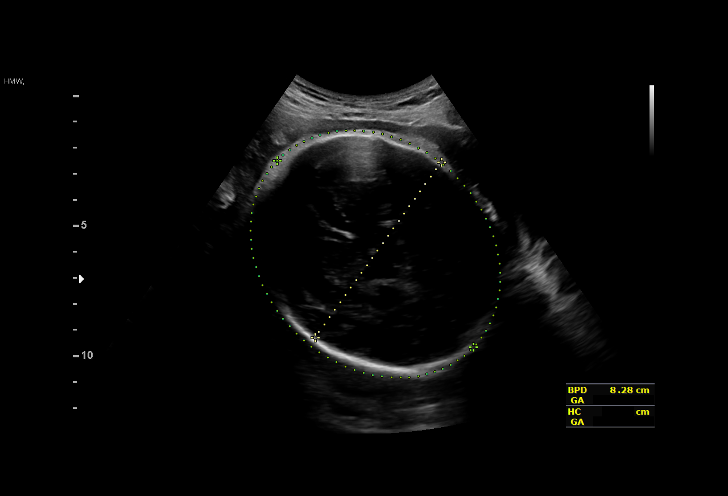
[im 6/29]
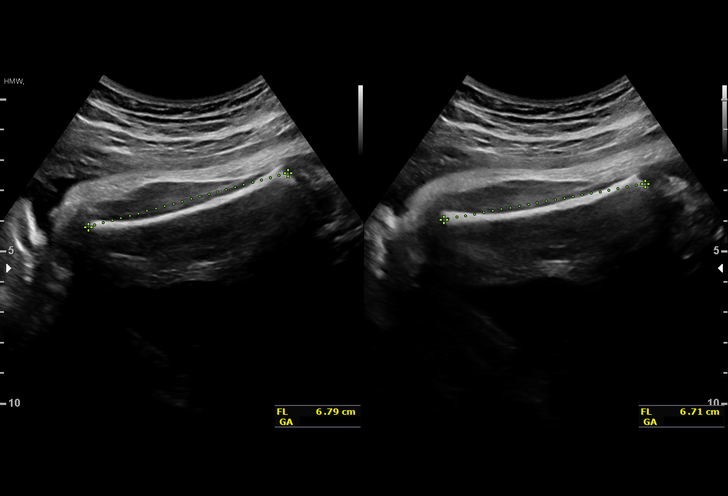
[im 8/29]
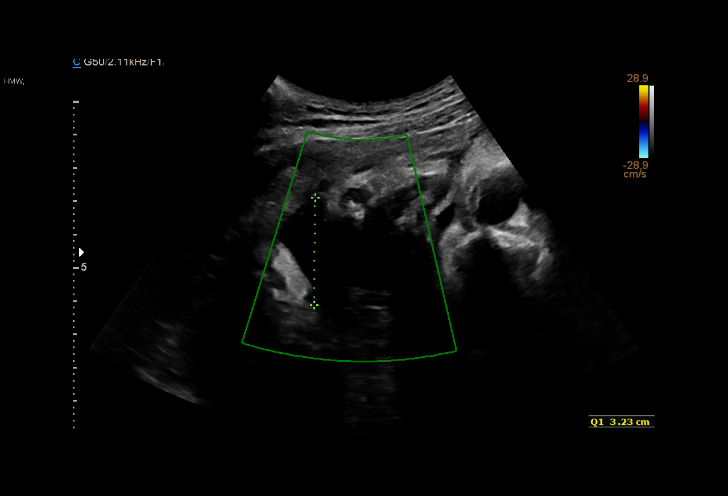
[im 10/29]
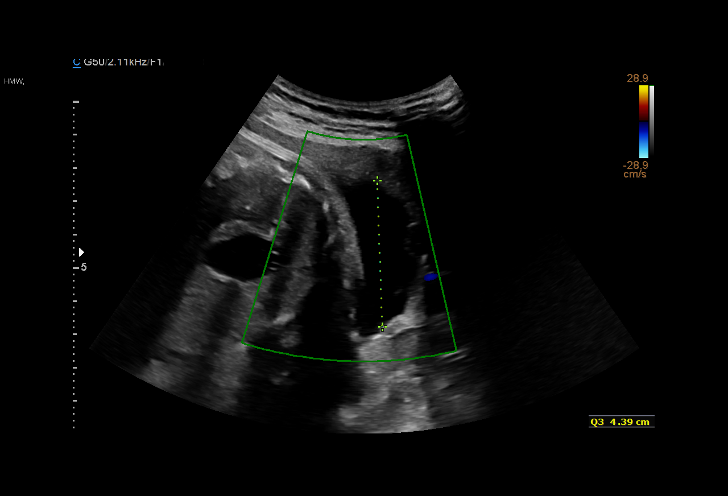
[im 12/29]
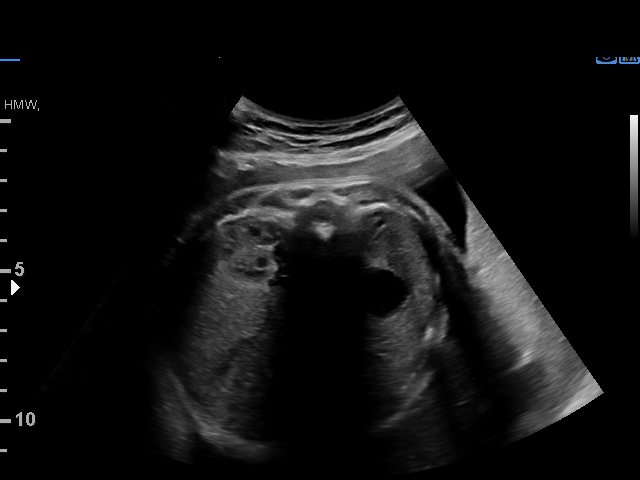
[im 14/29]
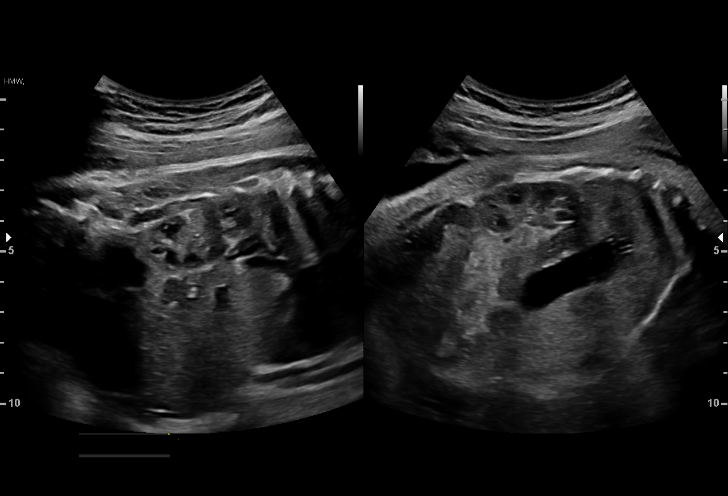
[im 16/29]
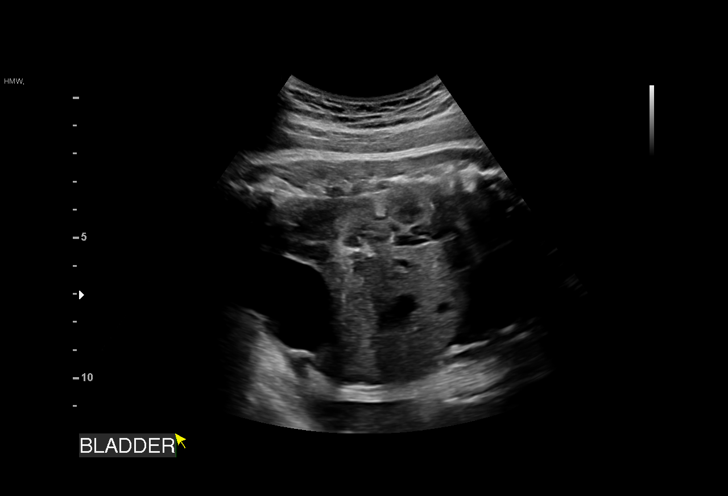
[im 18/29]
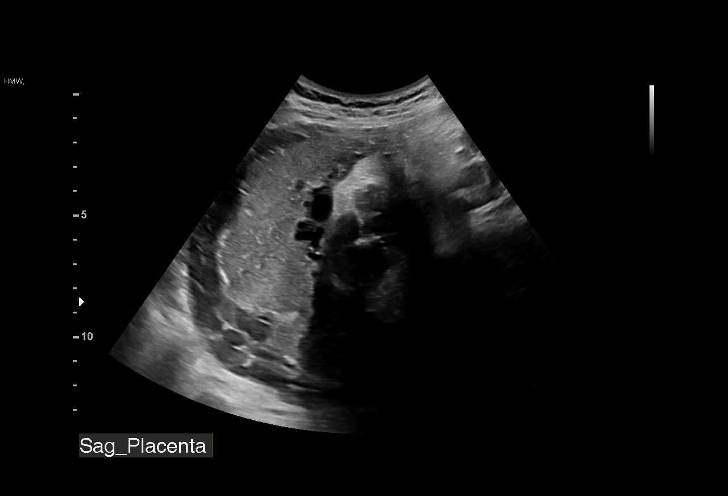
[im 20/29]
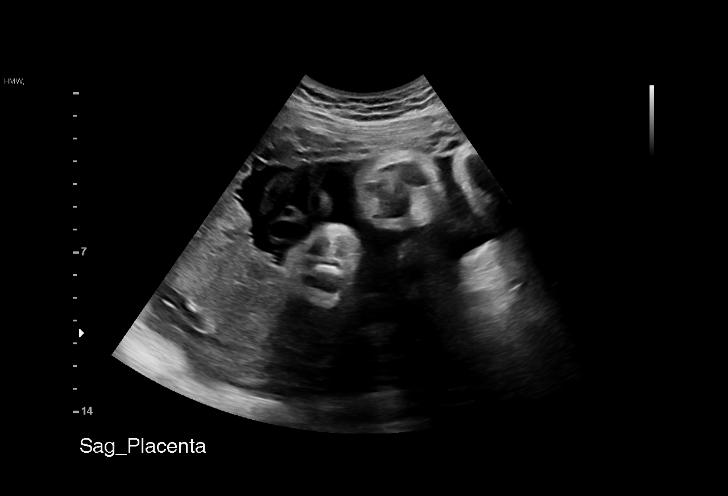
[im 22/29]
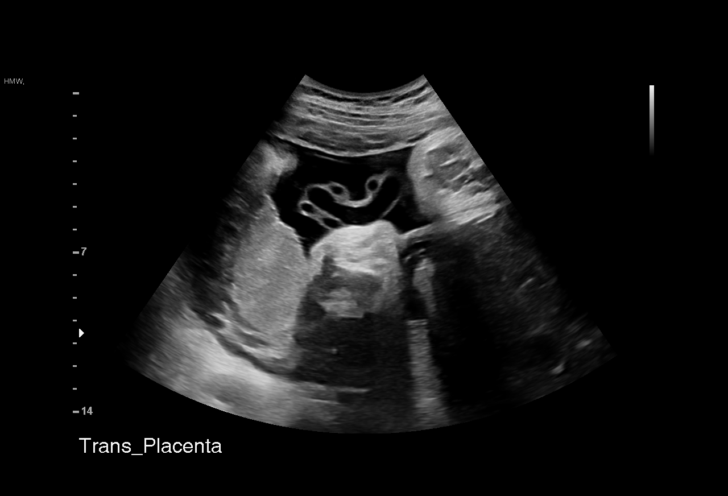
[im 24/29]
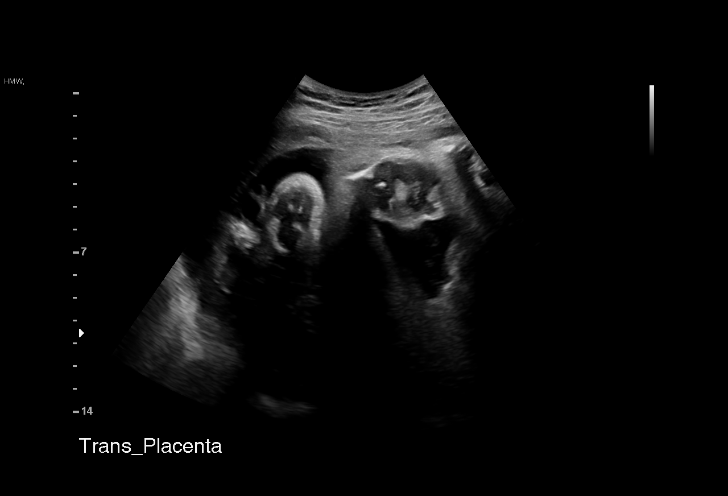
[im 26/29]
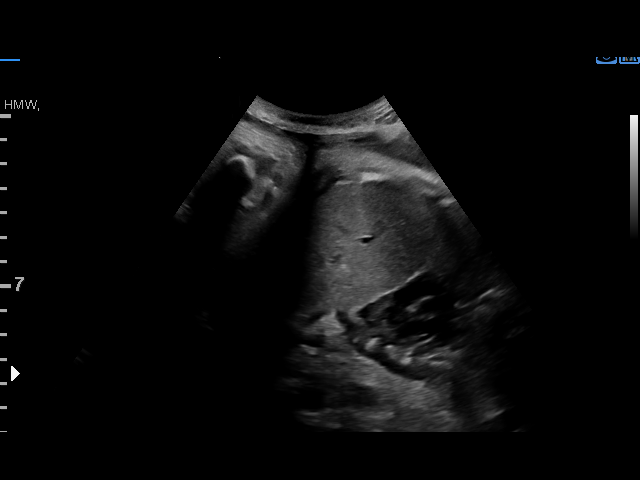
[im 29/29]
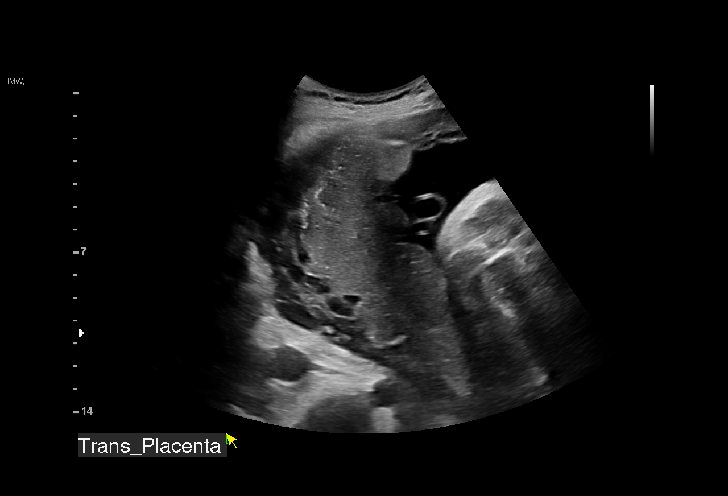

[14 of 28 positions shown; findings below may reference images not displayed]

CNM

 ----------------------------------------------------------------------

 ----------------------------------------------------------------------
Indications

  36 weeks gestation of pregnancy
  Seizure disorder (last seizure 6yo, no meds)
  Asthma                                         RUU.8U j32.080
  Uterine size-date discrepancy, third trimester
 ----------------------------------------------------------------------
Vital Signs

                                                Height:        5'3"
Fetal Evaluation

 Num Of Fetuses:          1
 Fetal Heart Rate(bpm):   168
 Cardiac Activity:        Observed
 Presentation:            Cephalic
 Placenta:                Posterior
 P. Cord Insertion:       Previously seen as normal

 Amniotic Fluid
 AFI FV:      Within normal limits

 AFI Sum(cm)     %Tile       Largest Pocket(cm)
 11.07           31

 RUQ(cm)       RLQ(cm)       LUQ(cm)        LLQ(cm)

Biometry

 BPD:      83.1  mm     G. Age:  33w 3d          2  %    CI:            77  %    70 - 86
                                                         FL/HC:       22.5  %    20.1 -
 HC:      299.9  mm     G. Age:  33w 2d        < 3  %    HC/AC:       0.96       0.93 -
 AC:      313.7  mm     G. Age:  35w 2d         30  %    FL/BPD:      81.2  %    71 - 87
 FL:       67.5  mm     G. Age:  34w 5d         10  %    FL/AC:       21.5  %    20 - 24
 HUM:        58  mm     G. Age:  33w 4d         16  %

 Est. FW:    2122   gm     5 lb 8 oz     24  %
OB History

 Gravidity:    1
Gestational Age

 LMP:           36w 3d        Date:  01/20/18                 EDD:   10/27/18
 U/S Today:     34w 1d                                        EDD:   11/12/18
 Best:          36w 3d     Det. By:  LMP  (01/20/18)          EDD:   10/27/18
Anatomy



 Other:  Parents do not wish to know sex of fetus. Heels visualized previously.
         Nasal bone visualized previously.
Cervix Uterus Adnexa

 Cervix
 Not visualized (advanced GA >16wks)

 Uterus
 No abnormality visualized.

 Left Ovary
 No adnexal mass visualized.

 Right Ovary
 No adnexal mass visualized.

 Cul De Sac
 No free fluid seen.

 Adnexa
 No abnormality visualized.
Impression

 Normal interval growth.
Recommendations

 Follow up growth as clinically indicated.

## 2020-07-04 ENCOUNTER — Telehealth: Payer: Medicaid Other | Admitting: Physician Assistant

## 2020-07-04 DIAGNOSIS — R197 Diarrhea, unspecified: Secondary | ICD-10-CM

## 2020-07-04 NOTE — Progress Notes (Signed)
We are sorry that you are not feeling well.  Here is how we plan to help!  Based on what you have shared with me it looks like you have Acute Infectious Diarrhea.  Most cases of acute diarrhea are due to infections with virus and bacteria and are self-limited conditions lasting less than 14 days.  For your symptoms you may take Imodium 2 mg tablets that are over the counter at your local pharmacy. Take two tablet now and then one after each loose stool up to 6 a day.  Antibiotics are not needed for most people with diarrhea.   HOME CARE  We recommend changing your diet to help with your symptoms for the next few days.  Drink plenty of fluids that contain water salt and sugar. Sports drinks such as Gatorade may help.   You may try broths, soups, bananas, applesauce, soft breads, mashed potatoes or crackers.   You are considered infectious for as long as the diarrhea continues. Hand washing or use of alcohol based hand sanitizers is recommend.  It is best to stay out of work or school until your symptoms stop.   GET HELP RIGHT AWAY  If you have dark yellow colored urine or do not pass urine frequently you should drink more fluids.    If your symptoms worsen   If you feel like you are going to pass out (faint)  You have a new problem  MAKE SURE YOU   Understand these instructions.  Will watch your condition.  Will get help right away if you are not doing well or get worse.  Your e-visit answers were reviewed by a board certified advanced clinical practitioner to complete your personal care plan.  Depending on the condition, your plan could have included both over the counter or prescription medications.  If there is a problem please reply  once you have received a response from your provider.  Your safety is important to [place].  If you have drug allergies check your prescription carefully.    You can use MyChart to ask questions about today's visit, request a non-urgent call  back, or ask for a work or school excuse for 24 hours related to this e-Visit. If it has been greater than 24 hours you will need to follow up with your provider, or enter a new e-Visit to address those concerns.   You will get an e-mail in the next two days asking about your experience.  I hope that your e-visit has been valuable and will speed your recovery. Thank you for using e-visits.  Greater than 5 minutes, yet less than 10 minutes of time have been spent researching, coordinating and implementing care for this patient today.

## 2020-07-11 IMAGING — US US FETAL BPP W/NONSTRESS
1 series · 13 of 14 positions shown · non-contrast
Comparison: none

[Series 1: us fetal bpp w/nonstress · 14 acquisitions, 13 frames shown]
[im 1/14]
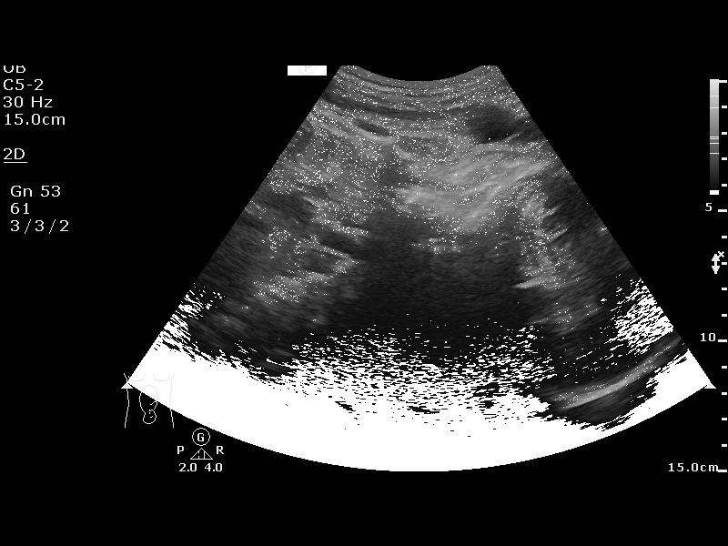
[im 2/14]
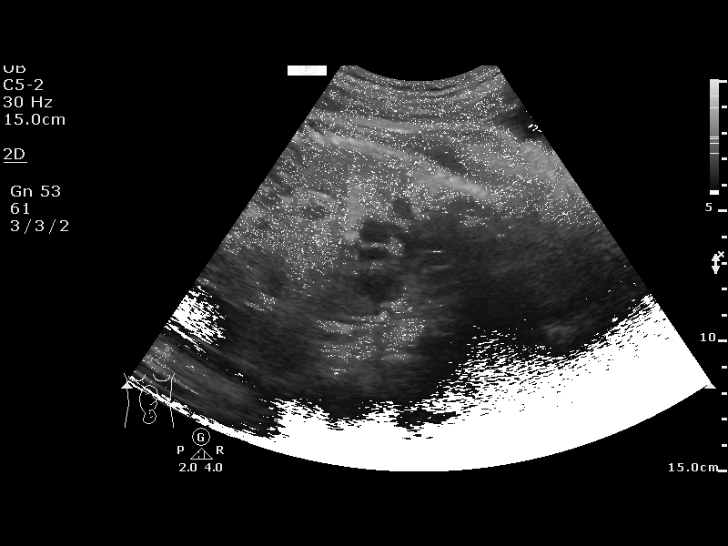
[im 3/14]
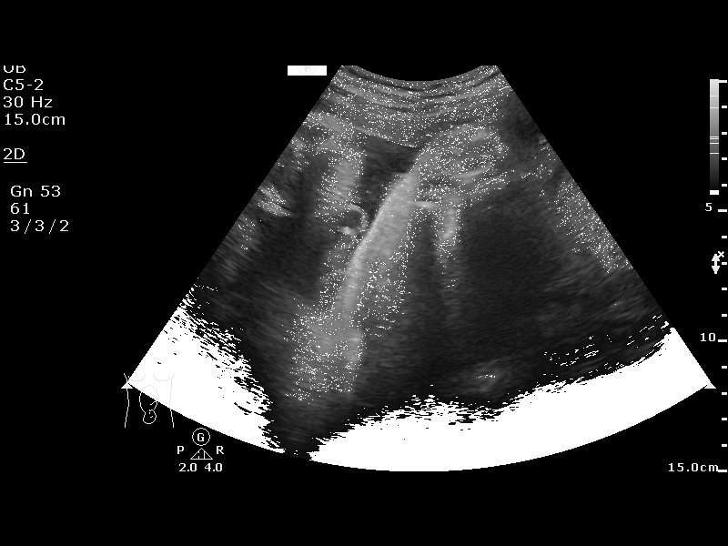
[im 4/14]
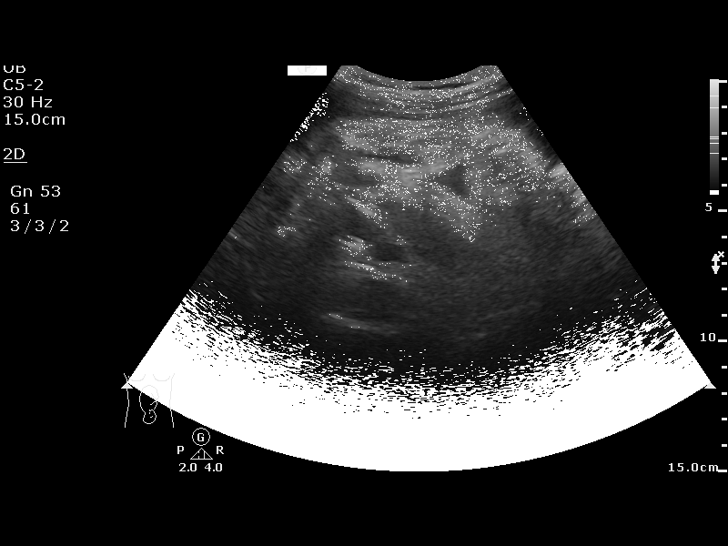
[im 5/14]
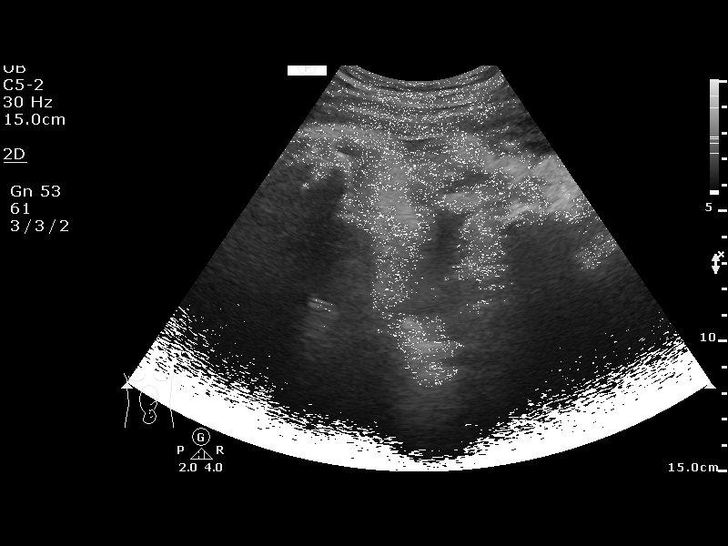
[im 6/14]
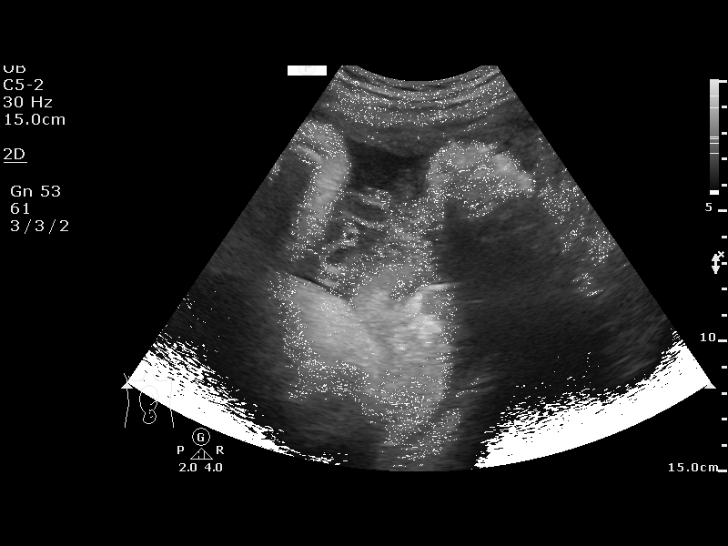
[im 8/14]
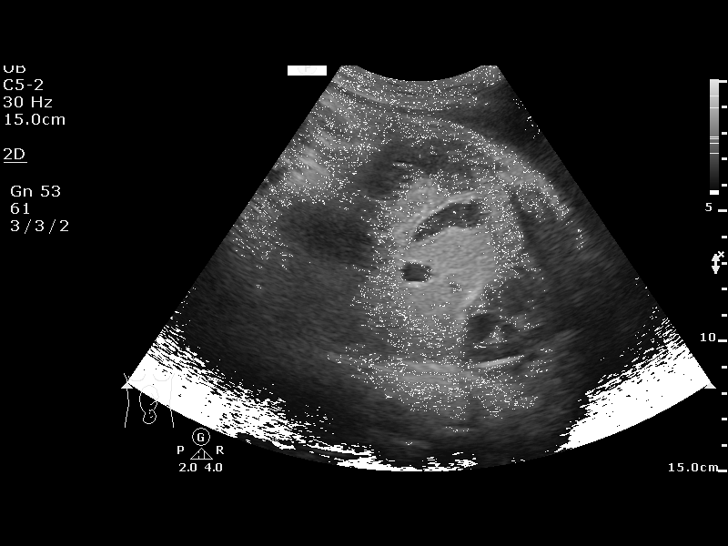
[im 9/14]
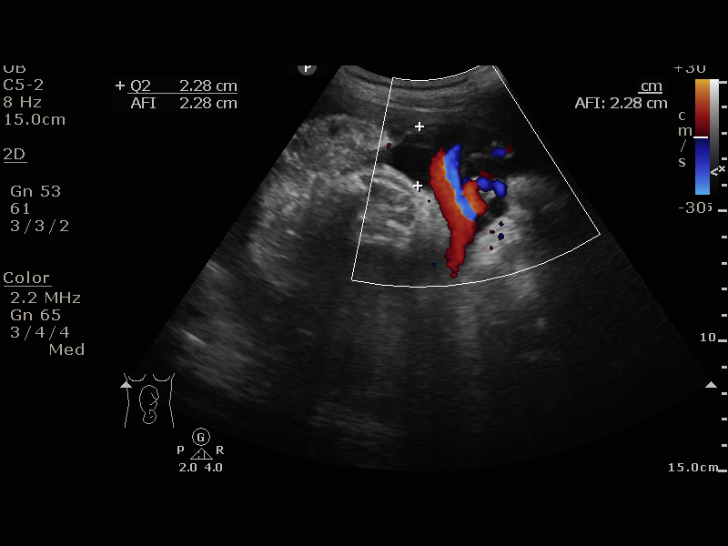
[im 10/14]
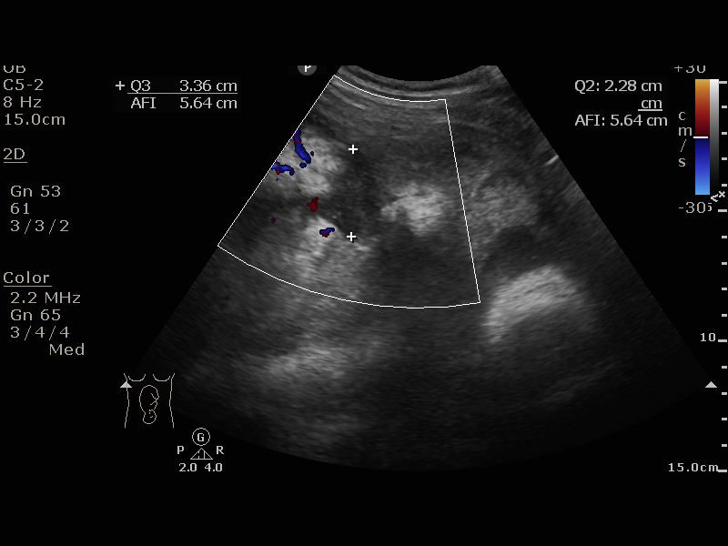
[im 11/14]
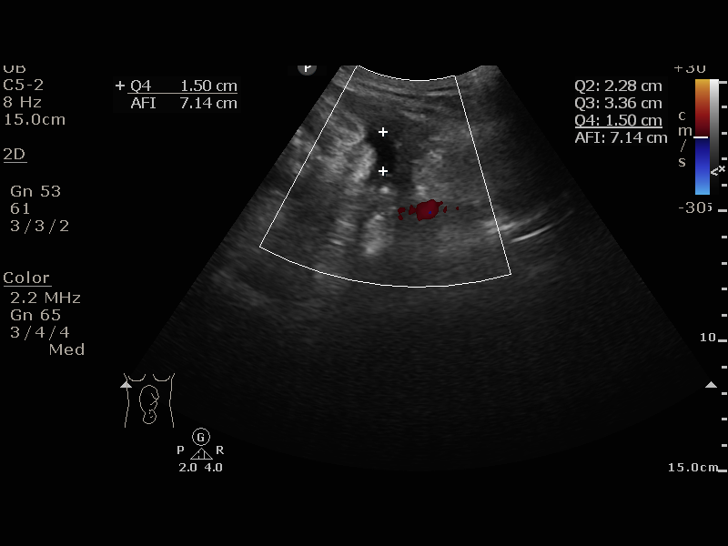
[im 12/14]
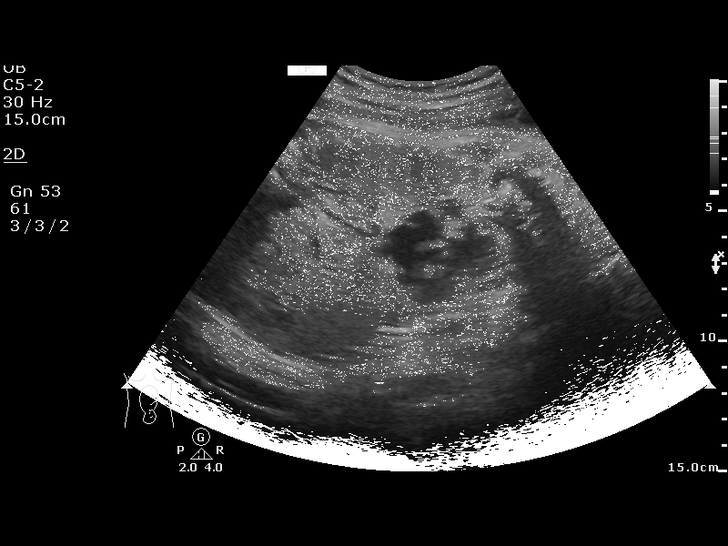
[im 13/14]
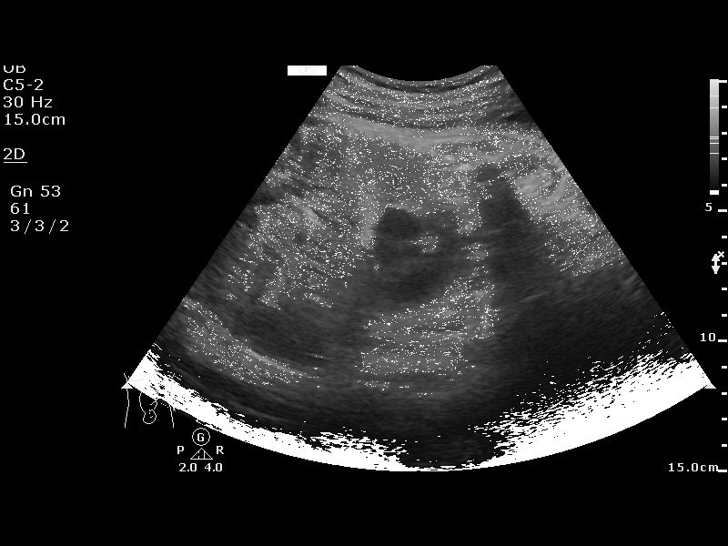
[im 14/14]
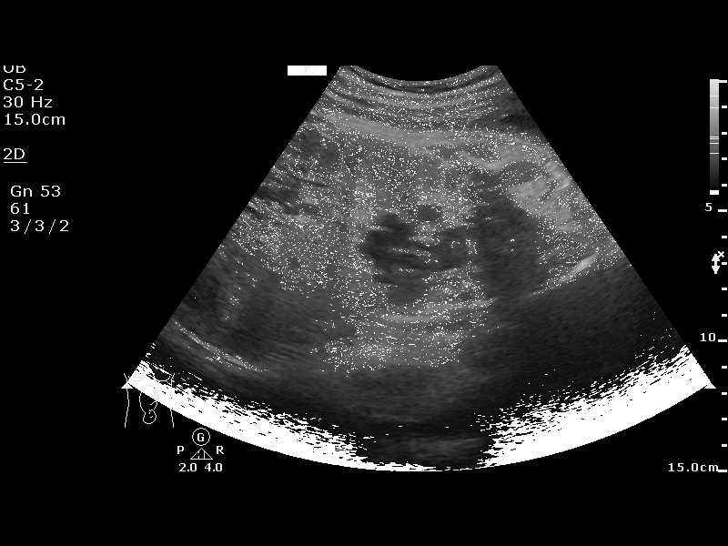

[13 of 14 positions shown; findings below may reference images not displayed]

OB/Gyn Clinic
                                                             Women[facility]
                                                             [REDACTED]

 ----------------------------------------------------------------------

 ----------------------------------------------------------------------
Service(s) Provided

 ----------------------------------------------------------------------
Indications

  40 weeks gestation of pregnancy
  Postdate pregnancy (40-42 weeks)
 ----------------------------------------------------------------------
Vital Signs

                                                Height:        5'3"
Fetal Evaluation

 Num Of Fetuses:          1
 Preg. Location:          Intrauterine
 Cardiac Activity:        Observed
 Presentation:            Cephalic

 Amniotic Fluid
 AFI FV:      Within normal limits

 AFI Sum(cm)     %Tile       Largest Pocket(cm)
 7.14            5

 RUQ(cm)       RLQ(cm)       LUQ(cm)        LLQ(cm)
 0
Biophysical Evaluation

 Amniotic F.V:   Pocket => 2 cm two         F. Tone:         Observed
                 planes
 F. Movement:    Observed                   N.S.T:           Reactive
 F. Breathing:   Observed                   Score:           [DATE]
OB History

 Gravidity:    1
Gestational Age

 LMP:           40w 0d        Date:  01/20/18                 EDD:    10/27/18
 Best:          40w 0d     Det. By:  LMP  (01/20/18)          EDD:    10/27/18
Impression

---------------------------------------------------------------------- Recommendations

 F/u as indicated. Postdates induction scheduled.
                  Jean Gaston Chouikha, DO

## 2020-07-15 ENCOUNTER — Telehealth: Payer: Medicaid Other | Admitting: Nurse Practitioner

## 2020-07-15 DIAGNOSIS — J4521 Mild intermittent asthma with (acute) exacerbation: Secondary | ICD-10-CM | POA: Diagnosis not present

## 2020-07-15 MED ORDER — ALBUTEROL SULFATE HFA 108 (90 BASE) MCG/ACT IN AERS: 2.0000 | INHALATION_SPRAY | Freq: Four times a day (QID) | RESPIRATORY_TRACT | 0 refills | Status: DC | PRN

## 2020-07-15 NOTE — Progress Notes (Signed)
Visit for Asthma  Based on what you have shared with me, it looks like you may have a flare up of your asthma.  Asthma is a chronic (ongoing) lung disease which results in airway obstruction, inflammation and hyper-responsiveness.   Asthma symptoms vary from person to person, with common symptoms including nighttime awakening and decreased ability to participate in normal activities as a result of shortness of breath. It is often triggered by changes in weather, changes in the season, changes in air temperature, or inside (home, school, daycare or work) allergens such as animal dander, mold, mildew, woodstoves or cockroaches.   It can also be triggered by hormonal changes, extreme emotion, physical exertion or an upper respiratory tract illness.     It is important to identify the trigger, and then eliminate or avoid the trigger if possible.   If you have been prescribed medications to be taken on a regular basis, it is important to follow the asthma action plan and to follow guidelines to adjust medication in response to increasing symptoms of decreased peak expiratory flow rate  Treatment: I have prescribed: Albuterol (Proventil HFA; Ventolin HFA) 108 (90 Base) MCG/ACT Inhaler 2 puffs into the lungs every six hours as needed for wheezing or shortness of breath  HOME CARE . Only take medications as instructed by your medical team. . Consider wearing a mask or scarf to improve breathing air temperature have been shown to decrease irritation and decrease exacerbations . Get rest. . Taking a steamy shower or using a humidifier may help nasal congestion sand ease sore throat pain. You can place a towel over your head and breathe in the steam from hot water coming from a faucet. . Using a saline nasal spray works much the same way.  . Cough drops, hare candies and sore throat lozenges may  ease your cough.  . Avoid close contacts especially the very you and the elderly . Cover your mouth if you cough or sneeze . Always remember to wash your hands.    GET HELP RIGHT AWAY IF: . You develop worsening symptoms; breathlessness at rest, drowsy, confused or agitated, unable to speak in full sentences . You have coughing fits . You develop a severe headache or visual changes . You develop shortness of breath, difficulty breathing or start having chest pain . Your symptoms persist after you have completed your treatment plan . If your symptoms do not improve within 10 days  MAKE SURE YOU . Understand these instructions. . Will watch your condition. . Will get help right away if you are not doing well or get worse.   Your e-visit answers were reviewed by a board certified advanced clinical practitioner to complete your personal care plan, Depending upon the condition, your plan could have included both over the counter or prescription medications.  Please review your pharmacy choice. Your safety is important to us. If you have drug allergies check your prescription carefully. You can use MyChart to ask questions about today's visit, request a non-urgent call back, or ask for a work or school excuse for 24 hours related to this e-Visit. If it has been greater than 24 hours you will need to follow up with your provider, or enter a new e-Visit to address those concerns.  You will get an e-mail in the next two days asking about your experience. I hope that your e-visit has been valuable and will speed your recovery. Thank you for using e-visits.  5-10 minutes spent reviewing and documenting   in chart.

## 2020-08-14 ENCOUNTER — Other Ambulatory Visit: Payer: Self-pay

## 2020-08-14 ENCOUNTER — Telehealth: Payer: Medicaid Other | Admitting: Family

## 2020-08-14 ENCOUNTER — Other Ambulatory Visit (INDEPENDENT_AMBULATORY_CARE_PROVIDER_SITE_OTHER): Payer: Self-pay

## 2020-08-14 DIAGNOSIS — U071 COVID-19: Secondary | ICD-10-CM

## 2020-08-14 MED ORDER — PREDNISONE 20 MG PO TABS: 40.0000 mg | ORAL_TABLET | Freq: Every day | ORAL | 0 refills | Status: DC

## 2020-08-14 MED ORDER — PROMETHAZINE-DM 6.25-15 MG/5ML PO SYRP: 5.0000 mL | ORAL_SOLUTION | Freq: Four times a day (QID) | ORAL | 0 refills | Status: DC | PRN

## 2020-08-14 NOTE — Progress Notes (Signed)
E-Visit for Corona Virus Screening  We are sorry you are not feeling well. We are here to help!  You have tested positive for COVID-19, meaning that you were infected with the novel coronavirus and could give the virus to others.  It is vitally important that you stay home so you do not spread it to others.      Please continue isolation at home, for at least 10 days since the start of your symptoms and until you have had 24 hours with no fever (without taking a fever reducer) and with improving of symptoms.  If you have no symptoms but tested positive (or all symptoms resolve after 5 days and you have no fever) you can leave your house but continue to wear a mask around others for an additional 5 days. If you have a fever,continue to stay home until you have had 24 hours of no fever. Most cases improve 5-10 days from onset but we have seen a small number of patients who have gotten worse after the 10 days.  Please be sure to watch for worsening symptoms and remain taking the proper precautions.   Go to the nearest hospital ED for assessment if fever/cough/breathlessness are severe or illness seems like a threat to life.    The following symptoms may appear 2-14 days after exposure: . Fever . Cough . Shortness of breath or difficulty breathing . Chills . Repeated shaking with chills . Muscle pain . Headache . Sore throat . New loss of taste or smell . Fatigue . Congestion or runny nose . Nausea or vomiting . Diarrhea  You have been enrolled in MyChart Home Monitoring for COVID-19. Daily you will receive a questionnaire within the MyChart website. Our COVID-19 response team will be monitoring your responses daily.  You can use medication such as A prescription cough medication called Phenergan DM 6.25 mg/15 mg. You make take one teaspoon / 5 ml every 4-6 hours as needed for cough  Prednisone was also sent to the pharmacy.   You may also take acetaminophen (Tylenol) as needed for  fever.  HOME CARE: . Only take medications as instructed by your medical team. . Drink plenty of fluids and get plenty of rest. . A steam or ultrasonic humidifier can help if you have congestion.   GET HELP RIGHT AWAY IF YOU HAVE EMERGENCY WARNING SIGNS.  Call 911 or proceed to your closest emergency facility if: . You develop worsening high fever. . Trouble breathing . Bluish lips or face . Persistent pain or pressure in the chest . New confusion . Inability to wake or stay awake . You cough up blood. . Your symptoms become more severe . Inability to hold down food or fluids  This list is not all possible symptoms. Contact your medical provider for any symptoms that are severe or concerning to you.    Your e-visit answers were reviewed by a board certified advanced clinical practitioner to complete your personal care plan.  Depending on the condition, your plan could have included both over the counter or prescription medications.  If there is a problem please reply once you have received a response from your provider.  Your safety is important to us.  If you have drug allergies check your prescription carefully.    You can use MyChart to ask questions about today's visit, request a non-urgent call back, or ask for a work or school excuse for 24 hours related to this e-Visit. If it has been greater   than 24 hours you will need to follow up with your provider, or enter a new e-Visit to address those concerns. You will get an e-mail in the next two days asking about your experience.  I hope that your e-visit has been valuable and will speed your recovery. Thank you for using e-visits.     Greater than 5 minutes, yet less than 10 minutes of time have been spent researching, coordinating, and implementing care for this patient today.  Thank you for the details you included in the comment boxes. Those details are very helpful in determining the best course of treatment for you and help  us to provide the best care.  

## 2020-10-13 ENCOUNTER — Other Ambulatory Visit: Payer: Self-pay

## 2020-10-13 ENCOUNTER — Telehealth: Payer: Medicaid Other | Admitting: Emergency Medicine

## 2020-10-13 DIAGNOSIS — J302 Other seasonal allergic rhinitis: Secondary | ICD-10-CM | POA: Diagnosis not present

## 2020-10-13 MED ORDER — TETRAHYDROZOLINE HCL 0.05 % OP SOLN: OPHTHALMIC | 0 refills | Status: DC

## 2020-10-13 MED ORDER — LEVOCETIRIZINE DIHYDROCHLORIDE 5 MG PO TABS: 5.0000 mg | ORAL_TABLET | Freq: Every evening | ORAL | 0 refills | Status: DC

## 2020-10-13 MED ORDER — FLUTICASONE PROPIONATE 50 MCG/ACT NA SUSP: 2.0000 | Freq: Every day | NASAL | 0 refills | Status: DC

## 2020-10-13 NOTE — Progress Notes (Signed)
E visit for Allergic Rhinitis We are sorry that you are not feeling well.  Here is how we plan to help!  Based on what you have shared with me it looks like you have Allergic Rhinitis.  Rhinitis is when a reaction occurs that causes nasal congestion, runny nose, sneezing, and itching.  Most types of rhinitis are caused by an inflammation and are associated with symptoms in the eyes ears or throat. There are several types of rhinitis.  The most common are acute rhinitis, which is usually caused by a viral illness, allergic or seasonal rhinitis, and nonallergic or year-round rhinitis.  Nasal allergies occur certain times of the year.  Allergic rhinitis is caused when allergens in the air trigger the release of histamine in the body.  Histamine causes itching, swelling, and fluid to build up in the fragile linings of the nasal passages, sinuses and eyelids.  An itchy nose and clear discharge are common.  I recommend the following over the counter treatments, which have been sent to your pharmacy as a prescription as they are occasionally cheaper through insurance: Xyzal 5 mg take 1 tablet daily  I also would recommend a nasal spray: Flonase 2 sprays into each nostril once daily  You may also benefit from eye drops such as: Visine 1-2 drops each eye twice daily as needed  HOME CARE:   You can use an over-the-counter saline nasal spray as needed  Avoid areas where there is heavy dust, mites, or molds  Stay indoors on windy days during the pollen season  Keep windows closed in home, at least in bedroom; use air conditioner.  Use high-efficiency house air filter  Keep windows closed in car, turn AC on re-circulate  Avoid playing out with dog during pollen season  GET HELP RIGHT AWAY IF:   If your symptoms do not improve within 10 days  You become short of breath  You develop yellow or green discharge from your nose for over 3 days  You have coughing fits  MAKE SURE  YOU:   Understand these instructions  Will watch your condition  Will get help right away if you are not doing well or get worse  Thank you for choosing an e-visit. Your e-visit answers were reviewed by a board certified advanced clinical practitioner to complete your personal care plan. Depending upon the condition, your plan could have included both over the counter or prescription medications. Please review your pharmacy choice. Be sure that the pharmacy you have chosen is open so that you can pick up your prescription now.  If there is a problem you may message your provider in MyChart to have the prescription routed to another pharmacy. Your safety is important to [place]. If you have drug allergies check your prescription carefully.  For the next 24 hours, you can use MyChart to ask questions about today's visit, request a non-urgent call back, or ask for a work or school excuse from your e-visit provider. You will get an email in the next two days asking about your experience. I hope that your e-visit has been valuable and will speed your recovery.    Approximately 5 minutes was spent documenting and reviewing patient's chart.

## 2020-10-18 ENCOUNTER — Telehealth: Payer: Medicaid Other | Admitting: Emergency Medicine

## 2020-10-18 DIAGNOSIS — R112 Nausea with vomiting, unspecified: Secondary | ICD-10-CM

## 2020-10-18 MED ORDER — ONDANSETRON 4 MG PO TBDP: 4.0000 mg | ORAL_TABLET | Freq: Three times a day (TID) | ORAL | 0 refills | Status: DC | PRN

## 2020-10-18 NOTE — Progress Notes (Signed)
We are sorry that you are not feeling well. Here is how we plan to help!  Based on what you have shared with me it looks like you have a Virus that is irritating your GI tract.  Vomiting is the forceful emptying of a portion of the stomach's content through the mouth.  Although nausea and vomiting can make you feel miserable, it's important to remember that these are not diseases, but rather symptoms of an underlying illness.  When we treat short term symptoms, we always caution that any symptoms that persist should be fully evaluated in a medical office.  I've been seeing quite a few patients over the past few days with the same symptoms.  I recommend the treatment below for the vomiting.  Generally, with the diarrhea, I recommend letting that run it's course.  Better to get it out of you provided that you are staying hydrated, which the nausea medicine will help with.  I have prescribed a medication that will help alleviate your symptoms and allow you to stay hydrated:  Zofran 4 mg 1 tablet every 8 hours as needed for nausea and vomiting  HOME CARE:  Drink clear liquids.  This is very important! Dehydration (the lack of fluid) can lead to a serious complication.  Start off with 1 tablespoon every 5 minutes for 8 hours.  You may begin eating bland foods after 8 hours without vomiting.  Start with saltine crackers, white bread, rice, mashed potatoes, applesauce.  After 48 hours on a bland diet, you may resume a normal diet.  Try to go to sleep.  Sleep often empties the stomach and relieves the need to vomit.  GET HELP RIGHT AWAY IF:   Your symptoms do not improve or worsen within 2 days after treatment.  You have a fever for over 3 days.  You cannot keep down fluids after trying the medication.  MAKE SURE YOU:   Understand these instructions.  Will watch your condition.  Will get help right away if you are not doing well or get worse.   Thank you for choosing an e-visit. Your  e-visit answers were reviewed by a board certified advanced clinical practitioner to complete your personal care plan. Depending upon the condition, your plan could have included both over the counter or prescription medications. Please review your pharmacy choice. Be sure that the pharmacy you have chosen is open so that you can pick up your prescription now.  If there is a problem you may message your provider in MyChart to have the prescription routed to another pharmacy. Your safety is important to [place]. If you have drug allergies check your prescription carefully.  For the next 24 hours, you can use MyChart to ask questions about today's visit, request a non-urgent call back, or ask for a work or school excuse from your e-visit provider. You will get an e-mail in the next two days asking about your experience. I hope that your e-visit has been valuable and will speed your recovery.   Approximately 5 minutes was used in reviewing the patient's chart, questionnaire, prescribing medications, and documentation.

## 2020-10-21 ENCOUNTER — Telehealth: Payer: Medicaid Other | Admitting: Emergency Medicine

## 2020-10-21 DIAGNOSIS — H6012 Cellulitis of left external ear: Secondary | ICD-10-CM

## 2020-10-21 MED ORDER — MUPIROCIN 2 % EX OINT: TOPICAL_OINTMENT | CUTANEOUS | 0 refills | Status: DC

## 2020-10-21 MED ORDER — CEPHALEXIN 500 MG PO CAPS: 500.0000 mg | ORAL_CAPSULE | Freq: Four times a day (QID) | ORAL | 0 refills | Status: AC

## 2020-10-21 NOTE — Progress Notes (Signed)
E Visit for Cellulitis  We are sorry that you are not feeling well. Here is how we plan to help!  Based on what you shared with me it looks like you have cellulitis.  Cellulitis looks like areas of skin redness, swelling, and warmth; it develops as a result of bacteria entering under the skin. Little red spots and/or bleeding can be seen in skin, and tiny surface sacs containing fluid can occur. Fever can be present. Cellulitis is almost always on one side of a body, and the lower limbs are the most common site of involvement.   I have prescribed:  Keflex 500mg  take one by mouth four times a day for 5 days and mupirocin 2% ointment 3 times daily to sores for 5 days.  HOME CARE:  . Take your medications as ordered and take all of them, even if the skin irritation appears to be healing.   GET HELP RIGHT AWAY IF:  . Symptoms that don't begin to go away within 48 hours. . Severe redness persists or worsens . If the area turns color, spreads or swells. . If it blisters and opens, develops yellow-brown crust or bleeds. . You develop a fever or chills. . If the pain increases or becomes unbearable.  . Are unable to keep fluids and food down.  MAKE SURE YOU    Understand these instructions.  Will watch your condition.  Will get help right away if you are not doing well or get worse.  Thank you for choosing an e-visit. Your e-visit answers were reviewed by a board certified advanced clinical practitioner to complete your personal care plan. Depending upon the condition, your plan could have included both over the counter or prescription medications. Please review your pharmacy choice. Make sure the pharmacy is open so you can pick up prescription now. If there is a problem, you may contact your provider through and have the prescription routed to another pharmacy. Your safety is important to [employer]. If you have drug allergies check your prescription carefully.  For the next 24  hours you can use MyChart to ask questions about today's visit, request a non-urgent call back, or ask for a work or school excuse. You will get an email in the next two days asking about your experience. I hope that your e-visit has been valuable and will speed your recovery.  Approximately 5 minutes was spent documenting and reviewing patient's chart.

## 2020-11-16 ENCOUNTER — Telehealth: Payer: Medicaid Other | Admitting: Family

## 2020-11-16 DIAGNOSIS — B373 Candidiasis of vulva and vagina: Secondary | ICD-10-CM

## 2020-11-16 DIAGNOSIS — B3731 Acute candidiasis of vulva and vagina: Secondary | ICD-10-CM

## 2020-11-16 MED ORDER — FLUCONAZOLE 150 MG PO TABS
150.0000 mg | ORAL_TABLET | ORAL | 0 refills | Status: DC | PRN
Start: 2020-11-16 — End: 2021-02-21

## 2020-11-16 NOTE — Progress Notes (Signed)

## 2020-12-04 ENCOUNTER — Telehealth: Payer: Medicaid Other | Admitting: Physician Assistant

## 2020-12-04 DIAGNOSIS — J310 Chronic rhinitis: Secondary | ICD-10-CM | POA: Diagnosis not present

## 2020-12-04 DIAGNOSIS — J4520 Mild intermittent asthma, uncomplicated: Secondary | ICD-10-CM

## 2020-12-04 MED ORDER — FLUTICASONE PROPIONATE 50 MCG/ACT NA SUSP: 2.0000 | Freq: Every day | NASAL | 0 refills | Status: DC

## 2020-12-04 MED ORDER — ALBUTEROL SULFATE HFA 108 (90 BASE) MCG/ACT IN AERS: 2.0000 | INHALATION_SPRAY | Freq: Four times a day (QID) | RESPIRATORY_TRACT | 0 refills | Status: DC | PRN

## 2020-12-04 NOTE — Progress Notes (Signed)
I have spent 5 minutes in review of e-visit questionnaire, review and updating patient chart, medical decision making and response to patient.   Maisey Deandrade Cody Darlyne Schmiesing, PA-C    

## 2020-12-04 NOTE — Progress Notes (Signed)
E visit for Allergic Rhinitis We are sorry that you are not feeling well.  Here is how we plan to help!  Based on what you have shared with me it looks like you have Allergic Rhinitis.  Rhinitis is when a reaction occurs that causes nasal congestion, runny nose, sneezing, and itching.  Most types of rhinitis are caused by an inflammation and are associated with symptoms in the eyes ears or throat. There are several types of rhinitis.  The most common are acute rhinitis, which is usually caused by a viral illness, allergic or seasonal rhinitis, and nonallergic or year-round rhinitis.  Nasal allergies occur certain times of the year.  Allergic rhinitis is caused when allergens in the air trigger the release of histamine in the body.  Histamine causes itching, swelling, and fluid to build up in the fragile linings of the nasal passages, sinuses and eyelids.  An itchy nose and clear discharge are common.  I recommend the following over the counter treatments: You should take a daily dose of antihistamine and Xyzal 5 mg take 1 tablet daily  I also would recommend a nasal spray: Flonase 2 sprays into each nostril once daily. A prescription has been sent to the pharmacy.   You may also benefit from eye drops such as: Systane 1-2 driops each eye twice daily as needed (OTC)  I am also sending in a albuterol inhaler to use as directed if needed for chest tightness.   HOME CARE:   You can use an over-the-counter saline nasal spray as needed  Avoid areas where there is heavy dust, mites, or molds  Stay indoors on windy days during the pollen season  Keep windows closed in home, at least in bedroom; use air conditioner.  Use high-efficiency house air filter  Keep windows closed in car, turn AC on re-circulate  Avoid playing out with dog during pollen season  GET HELP RIGHT AWAY IF:   If your symptoms do not improve within 10 days  You become short of breath  You develop yellow or green  discharge from your nose for over 3 days  You have coughing fits  MAKE SURE YOU:   Understand these instructions  Will watch your condition  Will get help right away if you are not doing well or get worse  Thank you for choosing an e-visit. Your e-visit answers were reviewed by a board certified advanced clinical practitioner to complete your personal care plan. Depending upon the condition, your plan could have included both over the counter or prescription medications. Please review your pharmacy choice. Be sure that the pharmacy you have chosen is open so that you can pick up your prescription now.  If there is a problem you may message your provider in MyChart to have the prescription routed to another pharmacy. Your safety is important to [place]. If you have drug allergies check your prescription carefully.  For the next 24 hours, you can use MyChart to ask questions about today's visit, request a non-urgent call back, or ask for a work or school excuse from your e-visit provider. You will get an email in the next two days asking about your experience. I hope that your e-visit has been valuable and will speed your recovery.

## 2020-12-05 ENCOUNTER — Telehealth: Payer: Medicaid Other | Admitting: Emergency Medicine

## 2020-12-05 DIAGNOSIS — J069 Acute upper respiratory infection, unspecified: Secondary | ICD-10-CM

## 2020-12-05 MED ORDER — IPRATROPIUM BROMIDE 0.03 % NA SOLN: 2.0000 | Freq: Two times a day (BID) | NASAL | 0 refills | Status: DC

## 2020-12-05 NOTE — Progress Notes (Signed)
We are sorry you are not feeling well.  Here is how we plan to help!  Based on what you have shared with me, it looks like you may have a viral upper respiratory infection.  Upper respiratory infections are caused by a large number of viruses; however, rhinovirus is the most common cause.   Symptoms vary from person to person, with common symptoms including sore throat, cough, fatigue or lack of energy and feeling of general discomfort.  A low-grade fever of up to 100.4 may present, but is often uncommon.  Symptoms vary however, and are closely related to a person's age or underlying illnesses.  The most common symptoms associated with an upper respiratory infection are nasal discharge or congestion, cough, sneezing, headache and pressure in the ears and face.  These symptoms usually persist for about 3 to 10 days, but can last up to 2 weeks.  It is important to know that upper respiratory infections do not cause serious illness or complications in most cases.    Upper respiratory infections can be transmitted from person to person, with the most common method of transmission being a person's hands.  The virus is able to live on the skin and can infect other persons for up to 2 hours after direct contact.  Also, these can be transmitted when someone coughs or sneezes; thus, it is important to cover the mouth to reduce this risk.  To keep the spread of the illness at bay, good hand hygiene is very important.  This is an infection that is most likely caused by a virus. There are no specific treatments other than to help you with the symptoms until the infection runs its course.  We are sorry you are not feeling well.  Here is how we plan to help!   For nasal congestion, you may use an oral decongestants such as Mucinex D or if you have glaucoma or high blood pressure use plain Mucinex.  Saline nasal spray or nasal drops can help and can safely be used as often as needed for congestion.  For your congestion,  I have prescribed Ipratropium Bromide nasal spray 0.03% two sprays in each nostril 2-3 times a day  You can try this instead of the Flonase you were prescribed yesterday to see if this helps better.   If you do not have a history of heart disease, hypertension, diabetes or thyroid disease, prostate/bladder issues or glaucoma, you may also use Sudafed to treat nasal congestion.  It is highly recommended that you consult with a pharmacist or your primary care physician to ensure this medication is safe for you to take.     If you have a cough, you may use cough suppressants such as Delsym and Robitussin.  If you have glaucoma or high blood pressure, you can also use Coricidin HBP.    If you have a sore or scratchy throat, use a saltwater gargle-  to  teaspoon of salt dissolved in a 4-ounce to 8-ounce glass of warm water.  Gargle the solution for approximately 15-30 seconds and then spit.  It is important not to swallow the solution.  You can also use throat lozenges/cough drops and Chloraseptic spray to help with throat pain or discomfort.  Warm or cold liquids can also be helpful in relieving throat pain.  For headache, pain or general discomfort, you can use Ibuprofen or Tylenol as directed.   Some authorities believe that zinc sprays or the use of Echinacea may shorten the course  of your symptoms.   HOME CARE . Only take medications as instructed by your medical team. . Be sure to drink plenty of fluids. Water is fine as well as fruit juices, sodas and electrolyte beverages. You may want to stay away from caffeine or alcohol. If you are nauseated, try taking small sips of liquids. How do you know if you are getting enough fluid? Your urine should be a pale yellow or almost colorless. . Get rest. . Taking a steamy shower or using a humidifier may help nasal congestion and ease sore throat pain. You can place a towel over your head and breathe in the steam from hot water coming from a  faucet. . Using a saline nasal spray works much the same way. . Cough drops, hard candies and sore throat lozenges may ease your cough. . Avoid close contacts especially the very young and the elderly . Cover your mouth if you cough or sneeze . Always remember to wash your hands.   GET HELP RIGHT AWAY IF: . You develop worsening fever. . If your symptoms do not improve within 10 days . You develop yellow or green discharge from your nose over 3 days. . You have coughing fits . You develop a severe head ache or visual changes. . You develop shortness of breath, difficulty breathing or start having chest pain . Your symptoms persist after you have completed your treatment plan  MAKE SURE YOU   Understand these instructions.  Will watch your condition.  Will get help right away if you are not doing well or get worse.  Your e-visit answers were reviewed by a board certified advanced clinical practitioner to complete your personal care plan. Depending upon the condition, your plan could have included both over the counter or prescription medications. Please review your pharmacy choice. If there is a problem, you may call our nursing hot line at and have the prescription routed to another pharmacy. Your safety is important to [place]. If you have drug allergies check your prescription carefully.   You can use MyChart to ask questions about today's visit, request a non-urgent call back, or ask for a work or school excuse for 24 hours related to this e-Visit. If it has been greater than 24 hours you will need to follow up with your provider, or enter a new e-Visit to address those concerns. You will get an e-mail in the next two days asking about your experience.  I hope that your e-visit has been valuable and will speed your recovery. Thank you for using e-visits.     Approximately 5 minutes was spent documenting and reviewing patient's chart.

## 2021-02-19 ENCOUNTER — Encounter (HOSPITAL_BASED_OUTPATIENT_CLINIC_OR_DEPARTMENT_OTHER): Payer: Self-pay | Admitting: Obstetrics and Gynecology

## 2021-02-19 ENCOUNTER — Emergency Department (HOSPITAL_BASED_OUTPATIENT_CLINIC_OR_DEPARTMENT_OTHER)
Admission: EM | Admit: 2021-02-19 | Discharge: 2021-02-19 | Disposition: A | Discharge status: Home / Self Care | Payer: Medicaid Other | Attending: Emergency Medicine | Admitting: Emergency Medicine

## 2021-02-19 ENCOUNTER — Other Ambulatory Visit: Payer: Self-pay

## 2021-02-19 DIAGNOSIS — Z20822 Contact with and (suspected) exposure to covid-19: Secondary | ICD-10-CM | POA: Diagnosis not present

## 2021-02-19 DIAGNOSIS — J029 Acute pharyngitis, unspecified: Secondary | ICD-10-CM | POA: Diagnosis not present

## 2021-02-19 DIAGNOSIS — Z7951 Long term (current) use of inhaled steroids: Secondary | ICD-10-CM | POA: Diagnosis not present

## 2021-02-19 DIAGNOSIS — Z28310 Unvaccinated for covid-19: Secondary | ICD-10-CM | POA: Diagnosis not present

## 2021-02-19 DIAGNOSIS — J4520 Mild intermittent asthma, uncomplicated: Secondary | ICD-10-CM | POA: Insufficient documentation

## 2021-02-19 DIAGNOSIS — Z87891 Personal history of nicotine dependence: Secondary | ICD-10-CM | POA: Diagnosis not present

## 2021-02-19 DIAGNOSIS — J028 Acute pharyngitis due to other specified organisms: Secondary | ICD-10-CM

## 2021-02-19 DIAGNOSIS — B9789 Other viral agents as the cause of diseases classified elsewhere: Secondary | ICD-10-CM

## 2021-02-19 LAB — RESP PANEL BY RT-PCR (FLU A&B, COVID) ARPGX2
Influenza A by PCR: NEGATIVE
Influenza B by PCR: NEGATIVE
SARS Coronavirus 2 by RT PCR: NEGATIVE

## 2021-02-19 LAB — GROUP A STREP BY PCR: Group A Strep by PCR: NOT DETECTED

## 2021-02-19 NOTE — Discharge Instructions (Addendum)
You were seen today for sore throat.  Your COVID, flu, and strep testing are all negative.  In all likelihood this is viral versus allergic.  Take ibuprofen as needed for pain.  You may gargle warm salt water.

## 2021-02-19 NOTE — ED Provider Notes (Signed)
[facility] EMERGENCY DEPT Provider Note   CSN: [id number] Arrival date & time: 02/19/21  2058     History Chief Complaint  Patient presents with   Sore Throat    Natasha Martinez is a 25 y.o. female.  HPI     This is a 24 year old female with a history of asthma, migraines, seizures who presents with sore throat.  Patient reports sore throat since Thursday.  She states it is worse when she swallows.  She has been able to swallow and is tolerating p.o. intake.  No fevers.  No cough.  No known sick contacts or COVID exposures.  She is not vaccinated against COVID-19.  She denies any other upper respiratory symptoms including shortness of breath, nasal congestion, ear pain.  She has taken Tylenol with some relief.  Past Medical History:  Diagnosis Date   Asthma    hasn't used inhaler in years   Migraine    Seizures (HCC)    as a child, last time when 71 yrs old    Patient Active Problem List   Diagnosis Date Noted   Preeclampsia in postpartum period 11/07/2018   Anemia affecting pregnancy in third trimester 09/16/2018   Postpartum care following vaginal delivery 05/23/2018   Asthma in adult, mild intermittent, uncomplicated 05/23/2018   Chronic migraine w/o aura w/o status migrainosus, not intractable 08/04/2015   History of seizures as a child 08/04/2015    Past Surgical History:  Procedure Laterality Date   Wisdom tooth removal       OB History     Gravida  1   Para  1   Term  1   Preterm      AB      Living  1      SAB      IAB      Ectopic      Multiple  0   Live Births  1           Family History  Problem Relation Age of [facility]   Healthy Mother    Healthy Father     Social History   Tobacco Use   Smoking status: Former    Types: Cigars    Quit date: 01/2018    Years since quitting: 3.0   Smokeless tobacco: Never  Vaping Use   Vaping Use: Never used  Substance Use Topics   Alcohol use: No   Drug use: Yes     Frequency: 3.0 times per week    Types: Marijuana    Home Medications Prior to Admission medications   Medication Sig Start Date End Date Taking? Authorizing Provider  albuterol (VENTOLIN HFA) 108 (90 Base) MCG/ACT inhaler Inhale 2 puffs into the lungs every 6 (six) hours as needed for wheezing or shortness of breath. 12/04/20   Waldon Merl, PA-C  fluconazole (DIFLUCAN) 150 MG tablet Take 1 tablet (150 mg total) by mouth every three (3) days as needed. 11/16/20   Junie Spencer, FNP  fluticasone (FLONASE) 50 MCG/ACT nasal spray Place 2 sprays into both nostrils daily. 12/04/20   Waldon Merl, PA-C  ipratropium (ATROVENT) 0.03 % nasal spray Place 2 sprays into both nostrils every 12 (twelve) hours. 12/05/20   Lurene Shadow, PA-C  levocetirizine (XYZAL) 5 MG tablet Take 1 tablet (5 mg total) by mouth every evening. 10/13/20   Lurene Shadow, PA-C  ondansetron (ZOFRAN ODT) 4 MG disintegrating tablet Take 1 tablet (4 mg total) by mouth every 8 (  eight) hours as needed for nausea or vomiting. 10/18/20   Roxy Horseman, PA-C  tetrahydrozoline (VISINE) 0.05 % ophthalmic solution 1-2 drops per eye up to 4 times daily as needed 10/13/20   Lurene Shadow, PA-C  enalapril (VASOTEC) 5 MG tablet Take 1 tablet (5 mg total) by mouth daily. 11/10/18 03/24/19  Anyanwu, Jethro Bastos, MD  ferrous sulfate 325 (65 FE) MG tablet Take 1 tablet (325 mg total) by mouth every other day. 09/15/18 03/24/19  Calvert Cantor, CNM  NIFEdipine (PROCARDIA-XL/NIFEDICAL-XL) 30 MG 24 hr tablet Take 1 tablet (30 mg total) by mouth daily. 11/11/18 03/24/19  Gerrit Heck, CNM  norgestimate-ethinyl estradiol (ORTHO-CYCLEN) 0.25-35 MG-MCG tablet Take 1 tablet by mouth daily. 12/11/18 03/24/19  Rolm Bookbinder, CNM    Allergies    Patient has no known allergies.  Review of Systems   Review of Systems  Constitutional:  Negative for fever.  HENT:  Positive for sore throat. Negative for trouble swallowing.   Respiratory:  Negative  for shortness of breath.   Gastrointestinal:  Negative for diarrhea, nausea and vomiting.  Genitourinary:  Negative for dysuria.  All other systems reviewed and are negative.  Physical Exam Updated Vital Signs BP (!) 127/93 (BP Location: Right Arm)   Pulse 98   Temp 99 F (37.2 C) (Oral)   Resp 18   Ht 1.626 m (5\' 4" )   Wt 59 kg   LMP  (LMP Unknown)   SpO2 100%   BMI 22.31 kg/m   Physical Exam Vitals and nursing note reviewed.  Constitutional:      Appearance: She is well-developed.  HENT:     Head: Normocephalic and atraumatic.     Mouth/Throat:     Comments: Slight posterior oropharyngeal erythema, uvula midline, bilateral tonsils 1+, no exudate noted, normal phonation, no stridor Eyes:     Pupils: Pupils are equal, round, and reactive to light.  Cardiovascular:     Rate and Rhythm: Normal rate and regular rhythm.     Heart sounds: Normal heart sounds.  Pulmonary:     Effort: Pulmonary effort is normal. No respiratory distress.     Breath sounds: No wheezing.  Abdominal:     General: Bowel sounds are normal.     Palpations: Abdomen is soft.  Musculoskeletal:     Cervical back: Neck supple.  Skin:    General: Skin is warm and dry.  Neurological:     Mental Status: She is alert and oriented to person, place, and time.  Psychiatric:        Mood and Affect: Mood normal.    ED Results / Procedures / Treatments   Labs (all labs ordered are listed, but only abnormal results are displayed) Labs Reviewed  GROUP A STREP BY PCR  RESP PANEL BY RT-PCR (FLU A&B, COVID) [id number]    EKG None  Radiology No results found.  Procedures Procedures   Medications Ordered in ED Medications - No data to display  ED Course  I have reviewed the triage vital signs and the nursing notes.  Pertinent labs & imaging results that were available during my care of the patient were reviewed by me and considered in my medical decision making (see chart for details).    MDM  Rules/Calculators/A&P                           Patient presents with sore throat.  She is overall nontoxic and vital signs  are reassuring.  She has some mild posterior oropharyngeal erythema but no tonsillar exudate, asymmetry.  Highly suspect viral versus allergic component.  Low suspicion for strep or deep space since infection such as peritonsillar abscess.  Strep and COVID screening sent.  These are all negative.  This highly reassuring.  Advised patient to use Motrin and gargle warm salt water for symptom relief.  After history, exam, and medical workup I feel the patient has been appropriately medically screened and is safe for discharge home. Pertinent diagnoses were discussed with the patient. Patient was given return precautions.  Final Clinical Impression(s) / ED Diagnoses Final diagnoses:  Sore throat (viral)    Rx / DC Orders ED Discharge Orders     None        Shon Baton, MD 02/19/21 2334

## 2021-02-19 NOTE — ED Notes (Signed)
Pt verbalizes understanding of discharge instructions. Opportunity for questioning and answers were provided. Armand removed by staff, pt discharged from ED to home. Educated on sore throat remedies.

## 2021-02-19 NOTE — ED Triage Notes (Signed)
Patient repots sore throat since Friday. Patient denies sick contacts. Patient denies cough, ShOB, N/V/D. Patient reports discomfort when swallowing

## 2021-02-20 ENCOUNTER — Telehealth: Payer: Self-pay

## 2021-02-20 NOTE — Telephone Encounter (Signed)
Transition Care Management Follow-up Telephone Call Date of discharge and from where: 02/19/2021-[facility] How have you been since you were released from the hospital? Patient stated she is feeling better  Any questions or concerns? No  Items Reviewed: Did the pt receive and understand the discharge instructions provided? Yes  Medications obtained and verified?  No medications given at discharge  Other? No  Any new allergies since your discharge? No  Dietary orders reviewed? N/A Do you have support at home? Yes   Home Care and Equipment/Supplies: Were home health services ordered? not applicable If so, what is the name of the agency? N/A  Has the agency set up a time to come to the patient's home? not applicable Were any new equipment or medical supplies ordered?  No What is the name of the medical supply agency? N/A Were you able to get the supplies/equipment? not applicable Do you have any questions related to the use of the equipment or supplies? No  Functional Questionnaire: (I = Independent and D = Dependent) ADLs: I  Bathing/Dressing- I  Meal Prep- I  Eating- I  Maintaining continence- I  Transferring/Ambulation- I  Managing Meds- I  Follow up appointments reviewed:  PCP Hospital f/u appt confirmed? No   Specialist Hospital f/u appt confirmed? No   Are transportation arrangements needed? No  If their condition worsens, is the pt aware to call PCP or go to the Emergency Dept.? Yes Was the patient provided with contact information for the PCP's office or ED? Yes Was to pt encouraged to call back with questions or concerns? Yes

## 2021-02-21 ENCOUNTER — Telehealth: Payer: Medicaid Other | Admitting: Physician Assistant

## 2021-02-21 ENCOUNTER — Telehealth: Payer: Medicaid Other | Admitting: Nurse Practitioner

## 2021-02-21 DIAGNOSIS — J4521 Mild intermittent asthma with (acute) exacerbation: Secondary | ICD-10-CM

## 2021-02-21 DIAGNOSIS — T7840XA Allergy, unspecified, initial encounter: Secondary | ICD-10-CM

## 2021-02-21 NOTE — Addendum Note (Signed)
Addended by: Margaretann Loveless on: 02/21/2021 11:55 AM   Modules accepted: Orders

## 2021-02-21 NOTE — Progress Notes (Signed)
Based on what you shared with me, I feel your condition warrants further evaluation and I recommend that you be seen in a face to face visit at local Urgent Care or ER giving constellation of symptoms concerning for severe reaction. You need evaluation ASAP.    NOTE: There will be NO CHARGE for this eVisit   If you are having a true medical emergency please call 911.      For an urgent face to face visit, [facility] has six urgent care centers for your convenience:     [facility] at [facility] Directions [phone] [address] [place], [place] [address]    [facility] Center [facility]) Get Driving Directions [phone] [address] [place], [place] [address]  [facility] Health Urgent Care Center [facility]) Get Driving Directions [phone] [address] [place],  [place]  [address]  [facility] Health Urgent Care at [facility] Get Driving Directions [phone] [address], [address] [place], [place] [address]   [facility] Urgent Care at [facility] Get Driving Directions  [phone] [address] [place], [place] [address]   [facility] Health Urgent Care at [facility] Directions [phone] [address], [address] [place], [place] [address]  Your MyChart E-visit questionnaire answers were reviewed by a board certified advanced clinical practitioner to complete your personal care plan based on your specific symptoms.  Thank you for using e-Visits.

## 2021-02-22 MED ORDER — PREDNISONE 20 MG PO TABS: 40.0000 mg | ORAL_TABLET | Freq: Every day | ORAL | 0 refills | Status: AC

## 2021-02-22 MED ORDER — ALBUTEROL SULFATE HFA 108 (90 BASE) MCG/ACT IN AERS: 2.0000 | INHALATION_SPRAY | Freq: Four times a day (QID) | RESPIRATORY_TRACT | 0 refills | Status: AC | PRN

## 2021-02-22 NOTE — Progress Notes (Signed)
Visit for Asthma  Based on what you have shared with me, it looks like you may have a flare up of your asthma.  Asthma is a chronic (ongoing) lung disease which results in airway obstruction, inflammation and hyper-responsiveness.   Asthma symptoms vary from person to person, with common symptoms including nighttime awakening and decreased ability to participate in normal activities as a result of shortness of breath. It is often triggered by changes in weather, changes in the season, changes in air temperature, or inside (home, school, daycare or work) allergens such as animal dander, mold, mildew, woodstoves or cockroaches.   It can also be triggered by hormonal changes, extreme emotion, physical exertion or an upper respiratory tract illness.     It is important to identify the trigger, and then eliminate or avoid the trigger if possible.   If you have been prescribed medications to be taken on a regular basis, it is important to follow the asthma action plan and to follow guidelines to adjust medication in response to increasing symptoms of decreased peak expiratory flow rate  Treatment: I have prescribed: Albuterol (Proventil HFA; Ventolin HFA) 108 (90 Base) MCG/ACT Inhaler 2 puffs into the lungs every six hours as needed for wheezing or shortness of breath and Prednisone 40mg by mouth per day for 5 - 7 days  HOME CARE . Only take medications as instructed by your medical team. . Consider wearing a mask or scarf to improve breathing air temperature have been shown to decrease irritation and decrease exacerbations . Get rest. . Taking a steamy shower or using a humidifier may help nasal congestion sand ease sore throat pain. You can place a towel over your head and breathe in the steam from hot water coming from a faucet. . Using a saline nasal spray works much the same way.  . Cough  drops, hare candies and sore throat lozenges may ease your cough.  . Avoid close contacts especially the very you and the elderly . Cover your mouth if you cough or sneeze . Always remember to wash your hands.    GET HELP RIGHT AWAY IF: . You develop worsening symptoms; breathlessness at rest, drowsy, confused or agitated, unable to speak in full sentences . You have coughing fits . You develop a severe headache or visual changes . You develop shortness of breath, difficulty breathing or start having chest pain . Your symptoms persist after you have completed your treatment plan . If your symptoms do not improve within 10 days  MAKE SURE YOU . Understand these instructions. . Will watch your condition. . Will get help right away if you are not doing well or get worse.   Your e-visit answers were reviewed by a board certified advanced clinical practitioner to complete your personal care plan, Depending upon the condition, your plan could have included both over the counter or prescription medications.  Please review your pharmacy choice. Your safety is important to us. If you have drug allergies check your prescription carefully. You can use MyChart to ask questions about today's visit, request a non-urgent call back, or ask for a work or school excuse for 24 hours related to this e-Visit. If it has been greater than 24 hours you will need to follow up with your provider, or enter a new e-Visit to address those concerns.  You will get an e-mail in the next two days asking about your experience. I hope that your e-visit has been valuable and will speed your recovery.   Thank you for using e-visits.  5-10 minutes spent reviewing and documenting in chart.  

## 2021-05-15 ENCOUNTER — Telehealth: Payer: Medicaid Other | Admitting: Physician Assistant

## 2021-05-15 ENCOUNTER — Other Ambulatory Visit: Payer: Self-pay

## 2021-05-15 DIAGNOSIS — L2084 Intrinsic (allergic) eczema: Secondary | ICD-10-CM

## 2021-05-15 MED ORDER — TRIAMCINOLONE ACETONIDE 0.1 % EX CREA: 1.0000 "application " | TOPICAL_CREAM | Freq: Two times a day (BID) | CUTANEOUS | 0 refills | Status: DC

## 2021-05-15 NOTE — Progress Notes (Signed)
E-Visit for Eczema  We are sorry that you are not feeling well. Here is how we plan to help! Based on what you shared with me it looks like you have eczema (atopic dermatitis).  Although the cause of eczema is not completely understood, genetics appear to play a strong role, and people with a family history of eczema are at increased risk of developing the condition. In most people with eczema, there is a genetic abnormality in the outermost layer of the skin, called the epidermis   Most people with eczema develop their first symptoms as children, before the age of 76. Intense itching of the skin, patches of redness, small bumps, and skin flaking are common. Scratching can further inflame the skin and worsen the itching. The itchiness may be more noticeable at nighttime.  Eczema commonly affects the back of the neck, the elbow creases, and the backs of the knees. Other affected areas may include the face, wrists, and forearms. The skin may become thickened and darkened, or even scarred, from repeated scratching. Eliminating factors that aggravate your eczema symptoms can help to control the symptoms. Possible triggers may include: ? Cold or dry environments ? Sweating ? Emotional stress or anxiety ? Rapid temperature changes ? Exposure to certain chemicals or cleaning solutions, including soaps and detergents, perfumes and cosmetics, wool or synthetic fibers, dust, sand, and cigarette smoke Keeping your skin hydrated Emollients -- Emollients are creams and ointments that moisturize the skin and prevent it from drying out. The best emollients for people with eczema are thick creams (such as Eucerin, Cetaphil, and Nutraderm) or ointments (such as petroleum jelly, Aquaphor, and Vaseline), which contain little to no water. Emollients are most effective when applied immediately after bathing. Emollients can be applied twice a day or more often if needed. Lotions contain more water than creams and  ointments and are less effective for moisturizing the skin. Bathing -- It is not clear if showers or baths are better for keeping the skin hydrated. Lukewarm baths or showers can hydrate and cool the skin, temporarily relieving itching from eczema. An unscented, mild soap or non-soap cleanser (such as Cetaphil) should be used sparingly. Apply an emollient immediately after bathing or showering to prevent your skin from drying out as a result of water evaporation. Emollient bath additives (products you add to the bath water) have not been found to help relieve symptoms. Hot or long baths (more than 10 to 15 minutes) and showers should be avoided since they can dry out the skin.  Based on what you shared with me you may have eczema.   Triamcinalone ointment (or cream). Apply to the effected areas twice per day. Please apply a very small amount to eyelids if needed using EXTREME caution to not get any steroid cream in the eye as this can cause blindness.   I recommend dilute bleach baths for people with eczema. These baths help to decrease the number of bacteria on the skin that can cause infections or worsen symptoms. To prepare a bleach bath, one-fourth to one-half cup of bleach is placed in a full bathtub (about 40 gallons) of water. Bleach baths are usually taken for 5 to 10 minutes twice per week and should be followed by application of an emollient (listed above). I recommend you take Benadryl 25mg  - 50mg  every 4 hours to control the symptoms (including itching) but if they last over 24 hours it is best that you see an office based provider for follow up.  HOME CARE: Take lukewarm showers or baths Apply creams and ointments to prevent the skin from drying (Eucerin, Cetaphil, Nutraderm, petroleum jelly, Aquaphor or Vaseline) - these products contain less water than other lotions and are more effective for moisturizing the skin Limit exposure to cold or dry environments, sweating, emotional stress  and anxiety, rapid temperature changes and exposure to chemicals and cleaning products, soaps and detergents, perfumes, cosmetics, wool and synthetic fibers, dust, sand and cigarette- factors which can aggravate eczema symptoms.  Use a hydrocortisone cream once or twice a day Take an antihistamine like Benadryl for widespread rashes that itch.  The adult dosage of Benadryl is 25-50 mg by mouth 4 times daily. Caution: This type of medication may cause sleepiness.  Do not drink alcohol, drive, or operate dangerous machinery while taking antihistamines.  Do not take these medications if you have prostate enlargement.  Read the package instructions thoroughly on all medications that you take.  GET HELP RIGHT AWAY IF: Symptoms that don't go away after treatment. Severe itching that persists. You develop a fever. Your skin begins to drain. You have a sore throat. You become short of breath.  MAKE SURE YOU   Understand these instructions. Will watch your condition. Will get help right away if you are not doing well or get worse.    Thank you for choosing an e-visit.  Your e-visit answers were reviewed by a board certified advanced clinical practitioner to complete your personal care plan. Depending upon the condition, your plan could have included both over the counter or prescription medications.  Please review your pharmacy choice. Make sure the pharmacy is open so you can pick up prescription now. If there is a problem, you may contact your provider through [employer] and have the prescription routed to another pharmacy.  Your safety is important to [place]. If you have drug allergies check your prescription carefully.   For the next 24 hours you can use MyChart to ask questions about today's visit, request a non-urgent call back, or ask for a work or school excuse. You will get an email in the next two days asking about your experience. I hope that your e-visit has been valuable and will  speed your recovery.  I provided 6 minutes of non face-to-face time during this encounter for chart review and documentation.

## 2021-06-09 ENCOUNTER — Other Ambulatory Visit: Payer: Self-pay

## 2021-06-09 ENCOUNTER — Encounter: Payer: Self-pay | Admitting: Family Medicine

## 2021-06-09 ENCOUNTER — Ambulatory Visit: Payer: Medicaid Other | Admitting: Family Medicine

## 2021-06-09 VITALS — BP 109/73 | HR 78 | Temp 97.6°F | Resp 16 | Ht 63.0 in | Wt 136.4 lb

## 2021-06-09 DIAGNOSIS — L989 Disorder of the skin and subcutaneous tissue, unspecified: Secondary | ICD-10-CM | POA: Diagnosis not present

## 2021-06-09 DIAGNOSIS — M25572 Pain in left ankle and joints of left foot: Secondary | ICD-10-CM

## 2021-06-09 DIAGNOSIS — Z7689 Persons encountering health services in other specified circumstances: Secondary | ICD-10-CM

## 2021-06-09 NOTE — Progress Notes (Signed)
Patient is here for establish care. Patient has pain of her left side that she says is coming from a lump on her finger. Patient says she hurt more when the weather is cold.

## 2021-06-09 NOTE — Progress Notes (Signed)
New Patient Office Visit  Subjective:  Patient ID: Natasha Martinez, female    DOB: 1995/08/04  Age: 25 y.o. MRN: [id number]  CC:  Chief Complaint  Patient presents with   Establish Care    HPI Natasha Martinez presents for to establish care. Patient reports that she has left hand pain. She reports a know on her hand that is increasing in size. She denies known trauma or injury. She reports intermittent numbness along with the pain in her hand. She is left hand dominant. She also reports some left foot pain. She denies known trauma or injury. Sx are intermittent.   Past Medical History:  Diagnosis Date   Asthma    hasn't used inhaler in years   Migraine    Seizures (HCC)    as a child, last time when 77 yrs old    Past Surgical History:  Procedure Laterality Date   Wisdom tooth removal      Family History  Problem Relation Age of Onset   Healthy Mother    Healthy Father     Social History   Socioeconomic History   Marital status: Single    Spouse name: Not on file   Number of children: Not on file   Years of education: Not on file   Highest education level: Not on file  Occupational History   Not on file  Tobacco Use   Smoking status: Former    Types: Cigars    Quit date: 01/2018    Years since quitting: 3.3   Smokeless tobacco: Never  Vaping Use   Vaping Use: Never used  Substance and Sexual Activity   Alcohol use: No   Drug use: Yes    Frequency: 3.0 times per week    Types: Marijuana   Sexual activity: Yes    Birth control/protection: None  Other Topics Concern   Not on file  Social History Narrative   Not on file   Social Determinants of Health   Financial Resource Strain: Not on file  Food Insecurity: Not on file  Transportation Needs: Not on file  Physical Activity: Not on file  Stress: Not on file  Social Connections: Not on file  Intimate Partner Violence: Not on file    ROS Review of Systems  All other systems reviewed and are  negative.  Objective:   Today's Vitals: BP 109/73   Pulse 78   Temp 97.6 F (36.4 C) (Temporal)   Resp 16   Ht 5\' 3"  (1.6 m)   Wt 136 lb 6.4 oz (61.9 kg)   SpO2 97%   BMI 24.16 kg/m   Physical Exam Vitals and nursing note reviewed.  Constitutional:      General: She is not in acute distress. Cardiovascular:     Rate and Rhythm: Normal rate and regular rhythm.  Pulmonary:     Effort: Pulmonary effort is normal.     Breath sounds: Normal breath sounds.  Musculoskeletal:     Right hand: Normal.     Left hand: Normal range of motion. Normal strength.     Comments: Solitary lesion noted on dorsolateral aspect of left hand near wrist. Lesion is soft and appears well circumscribed and slightly mobile with ttp.   Neurological:     General: No focal deficit present.     Mental Status: She is alert and oriented to person, place, and time.    Assessment & Plan:   1. Hand lesion Referral to hand surgeon for further eval/mgt -  Ambulatory referral to Hand Surgery  2. Pain of joint of left ankle and foot Discussed wearing appropriate footwear. Tylenol/nsaids prn. Will monitor  3. Encounter to establish care     Outpatient Encounter Medications as of 06/09/2021  Medication Sig   albuterol (VENTOLIN HFA) 108 (90 Base) MCG/ACT inhaler Inhale 2 puffs into the lungs every 6 (six) hours as needed for wheezing or shortness of breath.   fluticasone (FLONASE) 50 MCG/ACT nasal spray Place 2 sprays into both nostrils daily.   ipratropium (ATROVENT) 0.03 % nasal spray Place 2 sprays into both nostrils every 12 (twelve) hours.   triamcinolone cream (KENALOG) 0.1 % Apply 1 application topically 2 (two) times daily.   levocetirizine (XYZAL) 5 MG tablet Take 1 tablet (5 mg total) by mouth every evening.   [DISCONTINUED] enalapril (VASOTEC) 5 MG tablet Take 1 tablet (5 mg total) by mouth daily.   [DISCONTINUED] ferrous sulfate 325 (65 FE) MG tablet Take 1 tablet (325 mg total) by mouth every  other day.   [DISCONTINUED] NIFEdipine (PROCARDIA-XL/NIFEDICAL-XL) 30 MG 24 hr tablet Take 1 tablet (30 mg total) by mouth daily.   [DISCONTINUED] norgestimate-ethinyl estradiol (ORTHO-CYCLEN) 0.25-35 MG-MCG tablet Take 1 tablet by mouth daily.   No facility-administered encounter medications on file as of 06/09/2021.    Follow-up: Return in about 3 months (around 09/09/2021) for physical.   Tommie Raymond, MD

## 2021-06-12 ENCOUNTER — Other Ambulatory Visit: Payer: Self-pay

## 2021-06-12 ENCOUNTER — Encounter: Payer: Self-pay | Admitting: Orthopedic Surgery

## 2021-06-12 ENCOUNTER — Ambulatory Visit: Payer: Medicaid Other | Admitting: Orthopedic Surgery

## 2021-06-12 DIAGNOSIS — M67432 Ganglion, left wrist: Secondary | ICD-10-CM | POA: Insufficient documentation

## 2021-06-12 NOTE — H&P (View-Only) (Signed)
Office Visit Note   Patient: Natasha Martinez           Date of Birth: 03-29-1996           MRN: [id number] Visit Date: 06/12/2021              Requested by: Georganna Skeans, MD [address] [place],  [place] [address] PCP: Georganna Skeans, MD   Assessment & Plan: Visit Diagnoses:  1. Ganglion cyst of dorsum of left wrist     Plan: We discussed the diagnosis, prognosis, and both conservative and operative treatment options for ganglion cysts.  After our discussion, the patient has elected to proceed with surgical excision.  We reviewed the benefits of surgery and the potential risks including, but not limited to, recurrence of the cyst, infection, damage to nearby nerves and blood vessels, delayed wound healing.    All patient concerns and questions were addressed.  A surgical date will be confirmed with the patient.    Follow-Up Instructions: No follow-ups on file.   Orders:  No orders of the defined types were placed in this encounter.  No orders of the defined types were placed in this encounter.     Procedures: No procedures performed   Clinical Data: No additional findings.   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  It's been present for around a month.  It has increased and decreased in size.  She notes that it is occasionally bothersome.   She notes some mild decreased ROM when it gets cold.  She denies any numbness or tingling in the hand.  She has no other lumps or bumps.    Review of Systems   Objective: Vital Signs: BP 127/85 (BP Location: Left Arm, Patient Position: Sitting)   Pulse 76   Ht 5\' 3"  (1.6 m)   Wt 136 lb 8 oz (61.9 kg)   BMI 24.18 kg/m   Physical  Exam Constitutional:      Appearance: Normal appearance.  Cardiovascular:     Rate and Rhythm: Normal rate.     Pulses: Normal pulses.  Pulmonary:     Effort: Pulmonary effort is normal.  Skin:    General: Skin is warm and dry.     Capillary Refill: Capillary refill takes less than 2 seconds.  Neurological:     Mental Status: She is alert.    Right Hand Exam   Tenderness  The patient is experiencing no tenderness.   Range of Motion  The patient has normal right wrist ROM.   Other  Erythema: absent Sensation: normal Pulse: present  Comments:  Approx 2.5 x 2.5 round, well circumscribed, mobile, firm mass at dorsal radial wrist.  Mass is nontender.  Mass transilluminates.       Specialty Comments:  No specialty comments available.  Imaging: No results found.   PMFS History: Patient Active Problem List   Diagnosis Date Noted   Ganglion cyst of dorsum of left wrist 06/12/2021   Preeclampsia in postpartum period 11/07/2018   Anemia affecting pregnancy in third trimester 09/16/2018   Postpartum care following vaginal delivery 05/23/2018   Asthma in adult, mild intermittent, uncomplicated 05/23/2018  Chronic migraine w/o aura w/o status migrainosus, not intractable 08/04/2015   History of seizures as a child 08/04/2015   Past Medical History:  Diagnosis Date   Asthma    hasn't used inhaler in years   Migraine    Seizures (HCC)    as a child, last time when 36 yrs old    Family History  Problem Relation Age of Onset   Healthy Mother    Healthy Father     Past Surgical History:  Procedure Laterality Date   Wisdom tooth removal     Social History   Occupational History   Not on file  Tobacco Use   Smoking status: Former    Types: Cigars    Quit date: 01/2018    Years since quitting: 3.3   Smokeless tobacco: Never  Vaping Use   Vaping Use: Never used  Substance and Sexual Activity   Alcohol use: No   Drug use: Yes    Frequency: 3.0 times per week     Types: Marijuana   Sexual activity: Yes    Birth control/protection: None

## 2021-06-12 NOTE — Progress Notes (Signed)
Office Visit Note   Patient: Natasha Martinez           Date of Birth: 1995/10/06           MRN: [id number] Visit Date: 06/12/2021              Requested by: Georganna Skeans, MD [address] [place],  [place] [address] PCP: Georganna Skeans, MD   Assessment & Plan: Visit Diagnoses:  1. Ganglion cyst of dorsum of left wrist     Plan: We discussed the diagnosis, prognosis, and both conservative and operative treatment options for ganglion cysts.  After our discussion, the patient has elected to proceed with surgical excision.  We reviewed the benefits of surgery and the potential risks including, but not limited to, recurrence of the cyst, infection, damage to nearby nerves and blood vessels, delayed wound healing.    All patient concerns and questions were addressed.  A surgical date will be confirmed with the patient.    Follow-Up Instructions: No follow-ups on file.   Orders:  No orders of the defined types were placed in this encounter.  No orders of the defined types were placed in this encounter.     Procedures: No procedures performed   Clinical Data: No additional findings.   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  It's been present for around a month.  It has increased and decreased in size.  She notes that it is occasionally bothersome.   She notes some mild decreased ROM when it gets cold.  She denies any numbness or tingling in the hand.  She has no other lumps or bumps.    Review of Systems   Objective: Vital Signs: BP 127/85 (BP Location: Left Arm, Patient Position: Sitting)   Pulse 76   Ht 5\' 3"  (1.6 m)   Wt 136 lb 8 oz (61.9 kg)   BMI 24.18 kg/m   Physical  Exam Constitutional:      Appearance: Normal appearance.  Cardiovascular:     Rate and Rhythm: Normal rate.     Pulses: Normal pulses.  Pulmonary:     Effort: Pulmonary effort is normal.  Skin:    General: Skin is warm and dry.     Capillary Refill: Capillary refill takes less than 2 seconds.  Neurological:     Mental Status: She is alert.    Right Hand Exam   Tenderness  The patient is experiencing no tenderness.   Range of Motion  The patient has normal right wrist ROM.   Other  Erythema: absent Sensation: normal Pulse: present  Comments:  Approx 2.5 x 2.5 round, well circumscribed, mobile, firm mass at dorsal radial wrist.  Mass is nontender.  Mass transilluminates.       Specialty Comments:  No specialty comments available.  Imaging: No results found.   PMFS History: Patient Active Problem List   Diagnosis Date Noted   Ganglion cyst of dorsum of left wrist 06/12/2021   Preeclampsia in postpartum period 11/07/2018   Anemia affecting pregnancy in third trimester 09/16/2018   Postpartum care following vaginal delivery 05/23/2018   Asthma in adult, mild intermittent, uncomplicated 05/23/2018  Chronic migraine w/o aura w/o status migrainosus, not intractable 08/04/2015   History of seizures as a child 08/04/2015   Past Medical History:  Diagnosis Date   Asthma    hasn't used inhaler in years   Migraine    Seizures (HCC)    as a child, last time when 36 yrs old    Family History  Problem Relation Age of Onset   Healthy Mother    Healthy Father     Past Surgical History:  Procedure Laterality Date   Wisdom tooth removal     Social History   Occupational History   Not on file  Tobacco Use   Smoking status: Former    Types: Cigars    Quit date: 01/2018    Years since quitting: 3.3   Smokeless tobacco: Never  Vaping Use   Vaping Use: Never used  Substance and Sexual Activity   Alcohol use: No   Drug use: Yes    Frequency: 3.0 times per week     Types: Marijuana   Sexual activity: Yes    Birth control/protection: None

## 2021-06-26 ENCOUNTER — Other Ambulatory Visit: Payer: Self-pay

## 2021-06-26 ENCOUNTER — Encounter (HOSPITAL_BASED_OUTPATIENT_CLINIC_OR_DEPARTMENT_OTHER): Payer: Self-pay | Admitting: Orthopedic Surgery

## 2021-07-01 ENCOUNTER — Encounter: Payer: Medicaid Other | Admitting: Nurse Practitioner

## 2021-07-01 DIAGNOSIS — M67432 Ganglion, left wrist: Secondary | ICD-10-CM

## 2021-07-01 NOTE — Progress Notes (Signed)
Virtual Visit Consent   Natasha Martinez, you are scheduled for a virtual visit with a [facility] provider today.     Just as with appointments in the office, your consent must be obtained to participate.  Your consent will be active for this visit and any virtual visit you may have with one of our providers in the next 365 days.     If you have a MyChart account, a copy of this consent can be sent to you electronically.  All virtual visits are billed to your insurance company just like a traditional visit in the office.    As this is a virtual visit, video technology does not allow for your provider to perform a traditional examination.  This may limit your provider's ability to fully assess your condition.  If your provider identifies any concerns that need to be evaluated in person or the need to arrange testing (such as labs, EKG, etc.), we will make arrangements to do so.     Although advances in technology are sophisticated, we cannot ensure that it will always work on either your end or our end.  If the connection with a video visit is poor, the visit may have to be switched to a telephone visit.  With either a video or telephone visit, we are not always able to ensure that we have a secure connection.     I need to obtain your verbal consent now.   Are you willing to proceed with your visit today?    Natasha Martinez has provided verbal consent on 07/01/2021 for a virtual visit (video or telephone).   Claiborne Rigg, NP   Date: 07/01/2021 9:50 AM   Virtual Visit via Video Note   I, Claiborne Rigg, connected with  [name]  ([id number], 1996/06/28) on 07/01/21 at  9:45 AM EST by a video-enabled telemedicine application and verified that I am speaking with the correct person using two identifiers.  Location: Patient: Virtual Visit Location Patient: Home Provider: Virtual Visit Location Provider: Home Office   I discussed the limitations of evaluation and management by  telemedicine and the availability of in person appointments. The patient expressed understanding and agreed to proceed.    History of Present Illness: Natasha Martinez is a 25 y.o. who identifies as a female who was assigned female at birth, and is being seen today for ganglion cyst.  HPI Patient has a ganglion cyst of dorsum of the left wrist.  Scheduled for surgical excision on Monday afternoon however today she reports the cyst may have ruptured as it appears smaller and she is wondering if she still needs to have the surgery.  I have instructed her to call the orthopedist office after hours and leave a message regarding what if any further evaluation is needed.  Area is swollen and painful however she declines any analgesics today.    Problems:  Patient Active Problem List   Diagnosis Date Noted   Ganglion cyst of dorsum of left wrist 06/12/2021   Preeclampsia in postpartum period 11/07/2018   Anemia affecting pregnancy in third trimester 09/16/2018   Postpartum care following vaginal delivery 05/23/2018   Asthma in adult, mild intermittent, uncomplicated 05/23/2018   Chronic migraine w/o aura w/o status migrainosus, not intractable 08/04/2015   History of seizures as a child 08/04/2015    Allergies: No Known Allergies Medications:  Current Outpatient Medications:    albuterol (VENTOLIN HFA) 108 (90 Base) MCG/ACT inhaler, Inhale 2 puffs into the lungs every  6 (six) hours as needed for wheezing or shortness of breath., Disp: 8 g, Rfl: 0   fluticasone (FLONASE) 50 MCG/ACT nasal spray, Place 2 sprays into both nostrils daily., Disp: 16 g, Rfl: 0   ipratropium (ATROVENT) 0.03 % nasal spray, Place 2 sprays into both nostrils every 12 (twelve) hours., Disp: 30 mL, Rfl: 0   triamcinolone cream (KENALOG) 0.1 %, Apply 1 application topically 2 (two) times daily., Disp: 30 g, Rfl: 0  Observations/Objective: Patient is well-developed, well-nourished in no acute distress.  Resting comfortably  at  home.  Head is normocephalic, atraumatic.  No labored breathing.  Speech is clear and coherent with logical content.  Patient is alert and oriented at baseline.    Assessment and Plan: 1. Ganglion cyst of dorsum of left wrist Call Ortho care after hours and leave message regarding current medical symptoms  Follow Up Instructions: I discussed the assessment and treatment plan with the patient. The patient was provided an opportunity to ask questions and all were answered. The patient agreed with the plan and demonstrated an understanding of the instructions.  A copy of instructions were sent to the patient via MyChart unless otherwise noted below.    The patient was advised to call back or seek an in-person evaluation if the symptoms worsen or if the condition fails to improve as anticipated.  Time:  I spent 2 minutes with the patient via telehealth technology discussing the above problems/concerns.    Claiborne Rigg, NP

## 2021-07-03 ENCOUNTER — Ambulatory Visit: Payer: Medicaid Other | Admitting: Family

## 2021-07-03 ENCOUNTER — Encounter (HOSPITAL_BASED_OUTPATIENT_CLINIC_OR_DEPARTMENT_OTHER): Payer: Self-pay | Admitting: Orthopedic Surgery

## 2021-07-03 ENCOUNTER — Ambulatory Visit (HOSPITAL_BASED_OUTPATIENT_CLINIC_OR_DEPARTMENT_OTHER): Payer: Medicaid Other | Admitting: Anesthesiology

## 2021-07-03 ENCOUNTER — Ambulatory Visit (HOSPITAL_BASED_OUTPATIENT_CLINIC_OR_DEPARTMENT_OTHER)
Admission: RE | Admit: 2021-07-03 | Discharge: 2021-07-03 | Disposition: A | Discharge status: Home / Self Care | Payer: Medicaid Other | Attending: Orthopedic Surgery | Admitting: Orthopedic Surgery

## 2021-07-03 ENCOUNTER — Encounter (HOSPITAL_BASED_OUTPATIENT_CLINIC_OR_DEPARTMENT_OTHER)
Admission: RE | Disposition: A | Discharge status: Home / Self Care | Payer: Self-pay | Source: Home / Self Care | Attending: Orthopedic Surgery

## 2021-07-03 ENCOUNTER — Telehealth: Payer: Self-pay | Admitting: Orthopedic Surgery

## 2021-07-03 ENCOUNTER — Other Ambulatory Visit: Payer: Self-pay

## 2021-07-03 DIAGNOSIS — Z87891 Personal history of nicotine dependence: Secondary | ICD-10-CM | POA: Diagnosis not present

## 2021-07-03 DIAGNOSIS — G43909 Migraine, unspecified, not intractable, without status migrainosus: Secondary | ICD-10-CM | POA: Diagnosis not present

## 2021-07-03 DIAGNOSIS — J4520 Mild intermittent asthma, uncomplicated: Secondary | ICD-10-CM | POA: Diagnosis not present

## 2021-07-03 DIAGNOSIS — M67432 Ganglion, left wrist: Secondary | ICD-10-CM | POA: Insufficient documentation

## 2021-07-03 DIAGNOSIS — I10 Essential (primary) hypertension: Secondary | ICD-10-CM | POA: Diagnosis not present

## 2021-07-03 HISTORY — PX: GANGLION CYST EXCISION: SHX1691

## 2021-07-03 LAB — POCT PREGNANCY, URINE: Preg Test, Ur: NEGATIVE

## 2021-07-03 SURGERY — EXCISION, GANGLION CYST, WRIST
Anesthesia: General | Site: Wrist | Laterality: Left

## 2021-07-03 MED ORDER — OXYCODONE HCL 5 MG PO TABS
5.0000 mg | ORAL_TABLET | Freq: Once | ORAL | Status: AC | PRN
  Administered 2021-07-03: 5 mg via ORAL

## 2021-07-03 MED ORDER — OXYCODONE HCL 5 MG PO TABS
ORAL_TABLET | ORAL | Status: AC
  Filled 2021-07-03: qty 1

## 2021-07-03 MED ORDER — PROMETHAZINE HCL 25 MG/ML IJ SOLN: 6.2500 mg | INTRAMUSCULAR | Status: DC | PRN

## 2021-07-03 MED ORDER — HYDROMORPHONE HCL 1 MG/ML IJ SOLN
INTRAMUSCULAR | Status: AC
  Filled 2021-07-03: qty 0.5

## 2021-07-03 MED ORDER — MIDAZOLAM HCL 5 MG/5ML IJ SOLN
INTRAMUSCULAR | Status: DC | PRN
Start: 2021-07-03 — End: 2021-07-03
  Administered 2021-07-03: 2 mg via INTRAVENOUS

## 2021-07-03 MED ORDER — 0.9 % SODIUM CHLORIDE (POUR BTL) OPTIME
TOPICAL | Status: DC | PRN
  Administered 2021-07-03: 75 mL

## 2021-07-03 MED ORDER — FENTANYL CITRATE (PF) 100 MCG/2ML IJ SOLN
INTRAMUSCULAR | Status: DC | PRN
  Administered 2021-07-03 (×4): 50 ug via INTRAVENOUS

## 2021-07-03 MED ORDER — ONDANSETRON 4 MG PO TBDP
ORAL_TABLET | ORAL | Status: AC
  Filled 2021-07-03: qty 1

## 2021-07-03 MED ORDER — LACTATED RINGERS IV SOLN: INTRAVENOUS | Status: DC

## 2021-07-03 MED ORDER — PROPOFOL 10 MG/ML IV BOLUS
INTRAVENOUS | Status: AC
  Filled 2021-07-03: qty 20

## 2021-07-03 MED ORDER — LIDOCAINE 2% (20 MG/ML) 5 ML SYRINGE
INTRAMUSCULAR | Status: AC
  Filled 2021-07-03: qty 5

## 2021-07-03 MED ORDER — ONDANSETRON HCL 4 MG/2ML IJ SOLN
INTRAMUSCULAR | Status: DC | PRN
  Administered 2021-07-03: 4 mg via INTRAVENOUS

## 2021-07-03 MED ORDER — ONDANSETRON 4 MG PO TBDP
4.0000 mg | ORAL_TABLET | Freq: Once | ORAL | Status: AC
  Administered 2021-07-03: 4 mg via ORAL

## 2021-07-03 MED ORDER — LIDOCAINE 2% (20 MG/ML) 5 ML SYRINGE
INTRAMUSCULAR | Status: DC | PRN
Start: 2021-07-03 — End: 2021-07-03
  Administered 2021-07-03: 60 mg via INTRAVENOUS

## 2021-07-03 MED ORDER — HYDROMORPHONE HCL 1 MG/ML IJ SOLN
0.2500 mg | INTRAMUSCULAR | Status: DC | PRN
  Administered 2021-07-03 (×2): 0.5 mg via INTRAVENOUS

## 2021-07-03 MED ORDER — DEXAMETHASONE SODIUM PHOSPHATE 4 MG/ML IJ SOLN
INTRAMUSCULAR | Status: DC | PRN
  Administered 2021-07-03: 8 mg via INTRAVENOUS

## 2021-07-03 MED ORDER — MIDAZOLAM HCL 2 MG/2ML IJ SOLN
INTRAMUSCULAR | Status: AC
  Filled 2021-07-03: qty 2

## 2021-07-03 MED ORDER — MEPERIDINE HCL 25 MG/ML IJ SOLN: 6.2500 mg | INTRAMUSCULAR | Status: DC | PRN

## 2021-07-03 MED ORDER — PROPOFOL 10 MG/ML IV BOLUS
INTRAVENOUS | Status: DC | PRN
  Administered 2021-07-03: 200 mg via INTRAVENOUS

## 2021-07-03 MED ORDER — FENTANYL CITRATE (PF) 100 MCG/2ML IJ SOLN
INTRAMUSCULAR | Status: AC
  Filled 2021-07-03: qty 2

## 2021-07-03 MED ORDER — DEXAMETHASONE SODIUM PHOSPHATE 10 MG/ML IJ SOLN
INTRAMUSCULAR | Status: AC
  Filled 2021-07-03: qty 1

## 2021-07-03 MED ORDER — OXYCODONE HCL 5 MG/5ML PO SOLN: 5.0000 mg | Freq: Once | ORAL | Status: AC | PRN

## 2021-07-03 MED ORDER — BUPIVACAINE HCL (PF) 0.25 % IJ SOLN
INTRAMUSCULAR | Status: DC | PRN
  Administered 2021-07-03: 6 mL

## 2021-07-03 MED ORDER — AMISULPRIDE (ANTIEMETIC) 5 MG/2ML IV SOLN: 10.0000 mg | Freq: Once | INTRAVENOUS | Status: DC | PRN

## 2021-07-03 MED ORDER — ONDANSETRON HCL 4 MG/2ML IJ SOLN
INTRAMUSCULAR | Status: AC
  Filled 2021-07-03: qty 2

## 2021-07-03 SURGICAL SUPPLY — 45 items
APL PRP STRL LF DISP 70% ISPRP (MISCELLANEOUS) ×1
BLADE SURG 15 STRL LF DISP TIS (BLADE) ×1 IMPLANT
BLADE SURG 15 STRL SS (BLADE) ×2
BNDG CMPR 9X4 STRL LF SNTH (GAUZE/BANDAGES/DRESSINGS) ×1
BNDG ELASTIC 3X5.8 VLCR STR LF (GAUZE/BANDAGES/DRESSINGS) ×2 IMPLANT
BNDG ESMARK 4X9 LF (GAUZE/BANDAGES/DRESSINGS) ×2 IMPLANT
BNDG GAUZE ELAST 4 BULKY (GAUZE/BANDAGES/DRESSINGS) ×2 IMPLANT
BNDG PLASTER X FAST 3X3 WHT LF (CAST SUPPLIES) ×10 IMPLANT
BNDG PLSTR 9X3 FST ST WHT (CAST SUPPLIES)
CHLORAPREP W/TINT 26 (MISCELLANEOUS) ×2 IMPLANT
CORD BIPOLAR FORCEPS 12FT (ELECTRODE) ×2 IMPLANT
COVER BACK TABLE 60X90IN (DRAPES) ×2 IMPLANT
COVER MAYO STAND STRL (DRAPES) ×2 IMPLANT
CUFF TOURN SGL QUICK 18X4 (TOURNIQUET CUFF) IMPLANT
CUFF TOURN SGL QUICK 24 (TOURNIQUET CUFF)
CUFF TRNQT CYL 24X4X16.5-23 (TOURNIQUET CUFF) IMPLANT
DRAPE EXTREMITY T 121X128X90 (DISPOSABLE) ×2 IMPLANT
DRAPE SURG 17X23 STRL (DRAPES) ×1 IMPLANT
DRAPE U-SHAPE 47X51 STRL (DRAPES) ×1 IMPLANT
GAUZE SPONGE 4X4 12PLY STRL (GAUZE/BANDAGES/DRESSINGS) ×2 IMPLANT
GAUZE XEROFORM 1X8 LF (GAUZE/BANDAGES/DRESSINGS) ×1 IMPLANT
GLOVE SURG ENC MOIS LTX SZ7 (GLOVE) ×2 IMPLANT
GLOVE SURG POLYISO LF SZ7 (GLOVE) ×1 IMPLANT
GLOVE SURG UNDER POLY LF SZ7 (GLOVE) ×4 IMPLANT
GOWN STRL REUS W/ TWL LRG LVL3 (GOWN DISPOSABLE) ×1 IMPLANT
GOWN STRL REUS W/ TWL XL LVL3 (GOWN DISPOSABLE) IMPLANT
GOWN STRL REUS W/TWL LRG LVL3 (GOWN DISPOSABLE) ×2
GOWN STRL REUS W/TWL XL LVL3 (GOWN DISPOSABLE) ×3 IMPLANT
NDL HYPO 25X1 1.5 SAFETY (NEEDLE) IMPLANT
NEEDLE HYPO 25X1 1.5 SAFETY (NEEDLE) ×2 IMPLANT
NS IRRIG 1000ML POUR BTL (IV SOLUTION) ×2 IMPLANT
PACK BASIN DAY SURGERY FS (CUSTOM PROCEDURE TRAY) ×2 IMPLANT
PAD CAST 3X4 CTTN HI CHSV (CAST SUPPLIES) ×1 IMPLANT
PADDING CAST COTTON 3X4 STRL (CAST SUPPLIES)
SLEEVE SCD COMPRESS KNEE MED (STOCKING) IMPLANT
SUCTION FRAZIER HANDLE 10FR (MISCELLANEOUS)
SUCTION TUBE FRAZIER 10FR DISP (MISCELLANEOUS) ×1 IMPLANT
SUT ETHILON 4 0 PS 2 18 (SUTURE) ×2 IMPLANT
SUT MNCRL AB 3-0 PS2 18 (SUTURE) ×2 IMPLANT
SUT VICRYL 4-0 PS2 18IN ABS (SUTURE) IMPLANT
SYR BULB EAR ULCER 3OZ GRN STR (SYRINGE) ×2 IMPLANT
SYR CONTROL 10ML LL (SYRINGE) ×1 IMPLANT
TOWEL GREEN STERILE FF (TOWEL DISPOSABLE) ×3 IMPLANT
TUBE CONNECTING 20X1/4 (TUBING) ×1 IMPLANT
UNDERPAD 30X36 HEAVY ABSORB (UNDERPADS AND DIAPERS) ×2 IMPLANT

## 2021-07-03 NOTE — Brief Op Note (Signed)
07/03/2021  3:21 PM  PATIENT:  Natasha Martinez  25 y.o. female  PRE-OPERATIVE DIAGNOSIS:  Dorsal Ganglion Left Wrist  POST-OPERATIVE DIAGNOSIS:  Dorsal Ganglion Left Wrist  PROCEDURE:  Procedure(s): EXCISION DORSAL GANGLION LEFT WRIST (Left)  SURGEON:  Surgeon(s) and Role:    * Marlyne Beards, MD - Primary  PHYSICIAN ASSISTANT:   ASSISTANTS: none   ANESTHESIA:   general  EBL:  1 mL   BLOOD ADMINISTERED:none  DRAINS: none   LOCAL MEDICATIONS USED:  NONE  SPECIMEN:  No Specimen  DISPOSITION OF SPECIMEN:  N/A  COUNTS:  YES  TOURNIQUET:   Total Tourniquet Time Documented: Forearm (Left) - 26 minutes Total: Forearm (Left) - 26 minutes   DICTATION: .Reubin Milan Dictation  PLAN OF CARE: Discharge to home after PACU  PATIENT DISPOSITION:  PACU - hemodynamically stable.   Delay start of Pharmacological VTE agent (>24hrs) due to surgical blood loss or risk of bleeding: not applicable

## 2021-07-03 NOTE — Op Note (Signed)
   Date of Surgery: 07/03/2021  INDICATIONS: Ms. Gowin is a 25 y.o.-year-old female with left dorsal ganglion cyst.  Risks, benefits, and alternatives to surgery were again discussed with the patient wishing to proceed with surgery.  Informed consent was signed after our discussion.   PREOPERATIVE DIAGNOSIS: 1. Left dorsal ganglion cyst  POSTOPERATIVE DIAGNOSIS: Same.  PROCEDURE: 1. Excision of left dorsal ganglion cyst   SURGEON: Audria Nine, M.D.  ASSIST:   ANESTHESIA:  general  IV FLUIDS AND URINE: See anesthesia.  ESTIMATED BLOOD LOSS: 5 mL.  IMPLANTS: * No implants in log *   DRAINS: None  COMPLICATIONS: see description of procedure.  DESCRIPTION OF PROCEDURE: The patient was met in the preoperative holding area where the surgical site was marked and the consent form was verified.  The patient was then taken to the operating room and transferred to the operating table.  All bony prominences were well padded.  A tourniquet was applied to the left forearm.  The operative extremity was prepped and draped in the usual and sterile fashion.  A formal time-out was performed to confirm that this was the correct patient, surgery, side, and site.   Following timeout, the limb was exsanguinated with an Esmarch bandage and the tourniquet inflated to 250 mmHg.  A transverse incision was made directly over the mass.  The mass was quite small when we were discussing the surgery in the preoperative area.  In my office it was noted to be 2-1/2 x 2-1/2 cm but today it was maybe 1 x 1 cm.  The skin subtenons tissue was divided.  Blunt dissection was used to identify the area of the mass.  Blunt dissection with a tiny scissor identified a firm mass in the area of her complaint.  On further examination it appeared to have a stalk that traced to the radiocarpal joint.  It may be that the ganglion cyst decompressed spontaneously between her visit and today.  This identified mass was traced to the  radiocarpal joint and excised in its entirely.  The presumed origin from the radiocarpal capsule was then excised.  The wound was then thoroughly irrigated with sterile saline.  The tourniquet was deflated hemostasis was achieved with direct pressure.  The wound was closed using 4-0 nylon sutures in a horizontal mattress fashion  The wound was dressed with Xeroform, folded Kerlix sponge, and an Ace wrap.  Patient was then extubated uneventfully.  He was transferred to postoperative bed.  Counts were correct times within the procedure.  The patient was then taken the PACU in stable condition.  POSTOPERATIVE PLAN: Patient will be discharged home with appropriate pain medication start instructions.  See in the office in 10 to 14 days for her first postop visit.  Audria Nine, MD 3:28 PM

## 2021-07-03 NOTE — Anesthesia Postprocedure Evaluation (Signed)
Anesthesia Post Note  Patient: Natasha Martinez  Procedure(s) Performed: EXCISION DORSAL GANGLION LEFT WRIST (Left: Wrist)     Patient location during evaluation: PACU Anesthesia Type: General Level of consciousness: awake and alert Pain management: pain level controlled Vital Signs Assessment: post-procedure vital signs reviewed and stable Respiratory status: spontaneous breathing, nonlabored ventilation and respiratory function stable Cardiovascular status: blood pressure returned to baseline and stable Postop Assessment: no apparent nausea or vomiting Anesthetic complications: no   No notable events documented.  Last Vitals:  Vitals:   07/03/21 1525 07/03/21 1530  BP:  121/84  Pulse: 62 74  Resp: 15 17  Temp: 36.5 C   SpO2: 100% 100%    Last Pain:  Vitals:   07/03/21 1530  TempSrc:   PainSc: Asleep                 Lowella Curb

## 2021-07-03 NOTE — Transfer of Care (Signed)
Immediate Anesthesia Transfer of Care Note  Patient: Natasha Martinez  Procedure(s) Performed: EXCISION DORSAL GANGLION LEFT WRIST (Left: Wrist)  Patient Location: PACU  Anesthesia Type:General  Level of Consciousness: drowsy and patient cooperative  Airway & Oxygen Therapy: Patient Spontanous Breathing and Patient connected to face mask oxygen  Post-op Assessment: Report given to RN and Post -op Vital signs reviewed and stable  Post vital signs: Reviewed and stable  Last Vitals:  Vitals Value Taken Time  BP 132/93 07/03/21 1523  Temp    Pulse 62 07/03/21 1524  Resp 15 07/03/21 1524  SpO2 100 % 07/03/21 1524  Vitals shown include unvalidated device data.  Last Pain:  Vitals:   07/03/21 1227  TempSrc: Oral  PainSc: 2       Patients Stated Pain Goal: 2 (07/03/21 1227)  Complications: No notable events documented.

## 2021-07-03 NOTE — Discharge Instructions (Addendum)
Natasha Martinez, M.D. Hand Surgery  POST-OPERATIVE DISCHARGE INSTRUCTIONS   PRESCRIPTIONS: You have been given a prescription to be taken as directed for post-operative pain control.  You may also take over the counter ibuprofen/aleve and tylenol for pain. Take this as directed on the packaging. Do not exceed 3000 mg tylenol/acetaminophen in 24 hours.  Ibuprofen 600-800 mg (3-4) tablets by mouth every 6 hours as needed for pain.  OR Aleve 2 tablets by mouth every 12 hours (twice daily) as needed for pain.  AND/OR Tylenol 1000 mg (2 tablets) every 8 hours as needed for pain.  Please use your pain medication carefully, as refills are limited and you may not be provided with one.  As stated above, please use over the counter pain medicine - it will also be helpful with decreasing your swelling.    ANESTHESIA: After your surgery, post-surgical discomfort or pain is likely. This discomfort can last several days to a few weeks. At certain times of the day your discomfort may be more intense.   Did you receive a nerve block?  A nerve block can provide pain relief for one hour to two days after your surgery. As long as the nerve block is working, you will experience little or no sensation in the area the surgeon operated on.  As the nerve block wears off, you will begin to experience pain or discomfort. It is very important that you begin taking your prescribed pain medication before the nerve block fully wears off. Treating your pain at the first sign of the block wearing off will ensure your pain is better controlled and more tolerable when full-sensation returns. Do not wait until the pain is intolerable, as the medicine will be less effective. It is better to treat pain in advance than to try and catch up.   General Anesthesia:  If you did not receive a nerve block during your surgery, you will need to start taking your pain medication shortly after your surgery and should continue to do so  as prescribed by your surgeon.     ICE AND ELEVATION: You may use ice for the first 48-72 hours, but it is not critical.   Motion of your fingers is very important s to decrease the swelling.  Elevation, as much as possible for the next 48 hours, is critical for decreasing swelling as well as for pain relief. Elevation means when you are seated or lying down, you hand should be at or above your heart. When walking, the hand needs to be at or above the level of your elbow.  If the bandage gets too tight, it may need to be loosened. Please contact our office and we will instruct you in how to do this.    SURGICAL BANDAGES:  Keep your dressing and/or splint clean and dry at all times.  You can remove your dressing 4 days from now and change with a dry dressing or Band-Aids as needed thereafter. You may place a plastic bag over your bandage to shower, but be careful, do not get your bandages wet.  After the bandages have been removed, it is OK to get the stitches wet in a shower or with hand washing. Do Not soak or submerge the wound yet. Please do not use lotions or creams on the stitches.      HAND THERAPY:  You may not need any. If you do, we will begin this at your follow up visit in the clinic.    ACTIVITY AND  WORK: You are encouraged to move any fingers which are not in the bandage.  Light use of the fingers is allowed to assist the other hand with daily hygiene and eating, but strong gripping or lifting is often uncomfortable and should be avoided.  You might miss a variable period of time from work and hopefully this issue has been discussed prior to surgery. You may not do any heavy work with your affected hand for about 2 weeks.    [facility] [address] [place],  [place]  [address] [phone]   *You had 5 mg of Oxycodone at 4:30 pm  Post Anesthesia Home Care Instructions  Activity: Get plenty of rest for the remainder of the day. A responsible  individual must stay with you for 24 hours following the procedure.  For the next 24 hours, DO NOT: -Drive a car -Advertising copywriter -Drink alcoholic beverages -Take any medication unless instructed by your physician -Make any legal decisions or sign important papers.  Meals: Start with liquid foods such as gelatin or soup. Progress to regular foods as tolerated. Avoid greasy, spicy, heavy foods. If nausea and/or vomiting occur, drink only clear liquids until the nausea and/or vomiting subsides. Call your physician if vomiting continues.  Special Instructions/Symptoms: Your throat may feel dry or sore from the anesthesia or the breathing tube placed in your throat during surgery. If this causes discomfort, gargle with warm salt water. The discomfort should disappear within 24 hours.  If you had a scopolamine patch placed behind your ear for the management of post- operative nausea and/or vomiting:  1. The medication in the patch is effective for 72 hours, after which it should be removed.  Wrap patch in a tissue and discard in the trash. Wash hands thoroughly with soap and water. 2. You may remove the patch earlier than 72 hours if you experience unpleasant side effects which may include dry mouth, dizziness or visual disturbances. 3. Avoid touching the patch. Wash your hands with soap and water after contact with the patch.

## 2021-07-03 NOTE — Interval H&P Note (Signed)
History and Physical Interval Note:  07/03/2021 12:21 PM  Natasha Martinez  has presented today for surgery, with the diagnosis of Dorsal Ganglion Left Wrist.  The various methods of treatment have been discussed with the patient and family. After consideration of risks, benefits and other options for treatment, the patient has consented to  Procedure(s): EXCISION DORSAL GANGLION LEFT WRIST (Left) as a surgical intervention.  The patient's history has been reviewed, patient examined, no change in status, stable for surgery.  I have reviewed the patient's chart and labs.  Questions were answered to the patient's satisfaction.     Margarine Grosshans Zane Samson

## 2021-07-03 NOTE — Anesthesia Preprocedure Evaluation (Signed)
Anesthesia Evaluation  Patient identified by MRN, date of birth, ID band Patient awake    Reviewed: Allergy & Precautions, H&P , NPO status , Patient's Chart, lab work & pertinent test results  History of Anesthesia Complications Negative for: history of anesthetic complications  Airway Mallampati: II  TM Distance: >3 FB Neck ROM: full    Dental no notable dental hx.    Pulmonary asthma (last inhaler use >1 year ago) , former smoker,    Pulmonary exam normal        Cardiovascular hypertension, Normal cardiovascular exam Rhythm:regular Rate:Normal     Neuro/Psych  Headaches, Seizures - (childhood, no meds), Well Controlled,  negative psych ROS   GI/Hepatic negative GI ROS, Neg liver ROS,   Endo/Other  negative endocrine ROS  Renal/GU negative Renal ROS  negative genitourinary   Musculoskeletal   Abdominal   Peds  Hematology negative hematology ROS (+)   Anesthesia Other Findings   Reproductive/Obstetrics                             Anesthesia Physical  Anesthesia Plan  ASA: II  Anesthesia Plan: General   Post-op Pain Management:    Induction: Intravenous  PONV Risk Score and Plan: 3 and Ondansetron, Dexamethasone, Midazolam and Treatment may vary due to age or medical condition  Airway Management Planned: LMA  Additional Equipment:   Intra-op Plan:   Post-operative Plan: Extubation in OR  Informed Consent: I have reviewed the patients History and Physical, chart, labs and discussed the procedure including the risks, benefits and alternatives for the proposed anesthesia with the patient or authorized representative who has indicated his/her understanding and acceptance.     Dental advisory given  Plan Discussed with: CRNA  Anesthesia Plan Comments:         Anesthesia Quick Evaluation

## 2021-07-03 NOTE — Telephone Encounter (Signed)
[address] Banker) from [employer] called requesting pain medication been sent to [facility] on [address]. Pt is being discharged and need pain medication from surgery today. Please send to pharmacy. Pt phone number is [phone]. Elizabeth phone number is [phone].

## 2021-07-03 NOTE — Anesthesia Procedure Notes (Signed)
Procedure Name: LMA Insertion Date/Time: 07/03/2021 2:34 PM Performed by: Alford Highland, CRNA Pre-anesthesia Checklist: Patient identified, Emergency Drugs available, Suction available and Patient being monitored Patient Re-evaluated:Patient Re-evaluated prior to induction Oxygen Delivery Method: Circle System Utilized Preoxygenation: Pre-oxygenation with 100% oxygen Induction Type: IV induction Ventilation: Mask ventilation without difficulty LMA: LMA inserted LMA Size: 3.0 Number of attempts: 1 Airway Equipment and Method: Bite block Placement Confirmation: positive ETCO2 Tube secured with: Tape Dental Injury: Teeth and Oropharynx as per pre-operative assessment

## 2021-07-04 ENCOUNTER — Telehealth: Payer: Self-pay | Admitting: Orthopedic Surgery

## 2021-07-04 ENCOUNTER — Other Ambulatory Visit: Payer: Self-pay | Admitting: Orthopedic Surgery

## 2021-07-04 MED ORDER — OXYCODONE HCL 5 MG PO TABS: 5.0000 mg | ORAL_TABLET | Freq: Four times a day (QID) | ORAL | 0 refills | Status: AC | PRN

## 2021-07-04 NOTE — Telephone Encounter (Signed)
Attempted to contact patient however had to leave her a voicemail informing her that Dr Frazier Butt has sent her prescription into the pharmacy.

## 2021-07-04 NOTE — Telephone Encounter (Signed)
Pt called stating she had surgery on 07/03/21 and would like to have something called in for pain. Pt would like a CB to be updated on what was sent in and when it should be ready for pickup.

## 2021-07-06 ENCOUNTER — Encounter (HOSPITAL_BASED_OUTPATIENT_CLINIC_OR_DEPARTMENT_OTHER): Payer: Self-pay | Admitting: Orthopedic Surgery

## 2021-07-06 LAB — SURGICAL PATHOLOGY

## 2021-07-12 ENCOUNTER — Telehealth: Payer: Self-pay | Admitting: Orthopedic Surgery

## 2021-07-12 NOTE — Telephone Encounter (Signed)
Coming in on 1223/22 @ 930

## 2021-07-12 NOTE — Telephone Encounter (Signed)
Pt called for post op appt with Dr. Frazier Butt. Pt has surgery 12/12. Please open slot and call pt with appt. Pt states her incision starting to look purple. Please call pt at [phone].

## 2021-07-13 ENCOUNTER — Ambulatory Visit: Payer: Medicaid Other | Admitting: Family Medicine

## 2021-07-14 ENCOUNTER — Ambulatory Visit (INDEPENDENT_AMBULATORY_CARE_PROVIDER_SITE_OTHER): Payer: Medicaid Other | Admitting: Orthopedic Surgery

## 2021-07-14 ENCOUNTER — Other Ambulatory Visit: Payer: Self-pay

## 2021-07-14 ENCOUNTER — Encounter: Payer: Self-pay | Admitting: Orthopedic Surgery

## 2021-07-14 DIAGNOSIS — M67432 Ganglion, left wrist: Secondary | ICD-10-CM

## 2021-07-14 NOTE — Progress Notes (Signed)
° °  Post-Op Visit Note   Patient: Natasha Martinez           Date of Birth: [birth year]           MRN: [id number] Visit Date: 07/14/2021 PCP: Georganna Skeans, MD   Assessment & Plan:  Chief Complaint:  Chief Complaint  Patient presents with   Left Wrist - Post-op Follow-up   Visit Diagnoses:  1. Ganglion cyst of dorsum of left wrist     Plan: Patient's incision is well healed without surrounding erythema or induration.  Sutures removed.  Reviewed surgical pathology which was consistent with a ganglion cyst.  Discussed scar massage.  Encourage use of hand/wrist to prevent postop stiffness.   Follow-Up Instructions: No follow-ups on file.   Orders:  No orders of the defined types were placed in this encounter.  No orders of the defined types were placed in this encounter.   Imaging: No results found.  PMFS History: Patient Active Problem List   Diagnosis Date Noted   Ganglion cyst of dorsum of left wrist 06/12/2021   Preeclampsia in postpartum period 11/07/2018   Anemia affecting pregnancy in third trimester 09/16/2018   Postpartum care following vaginal delivery 05/23/2018   Asthma in adult, mild intermittent, uncomplicated 05/23/2018   Chronic migraine w/o aura w/o status migrainosus, not intractable 08/04/2015   History of seizures as a child 08/04/2015   Past Medical History:  Diagnosis Date   Asthma    hasn't used inhaler in years   Migraine    Seizures (HCC)    as a child, last time when 25 yrs old    Family History  Problem Relation Age of Onset   Healthy Mother    Healthy Father     Past Surgical History:  Procedure Laterality Date   GANGLION CYST EXCISION Left 07/03/2021   Procedure: EXCISION DORSAL GANGLION LEFT WRIST;  Surgeon: Marlyne Beards, MD;  Location: [facility];  Service: Orthopedics;  Laterality: Left;   Wisdom tooth removal     Social History   Occupational History   Not on file  Tobacco Use   Smoking status: Former     Types: Cigars    Quit date: 01/2018    Years since quitting: 3.4   Smokeless tobacco: Never  Vaping Use   Vaping Use: Never used  Substance and Sexual Activity   Alcohol use: No   Drug use: Yes    Frequency: 3.0 times per week    Types: Marijuana   Sexual activity: Yes    Birth control/protection: None

## 2021-08-06 ENCOUNTER — Telehealth: Payer: Medicaid Other | Admitting: Emergency Medicine

## 2021-08-06 DIAGNOSIS — J4520 Mild intermittent asthma, uncomplicated: Secondary | ICD-10-CM | POA: Diagnosis not present

## 2021-08-06 DIAGNOSIS — J309 Allergic rhinitis, unspecified: Secondary | ICD-10-CM

## 2021-08-06 MED ORDER — PREDNISONE 10 MG (21) PO TBPK: ORAL_TABLET | Freq: Every day | ORAL | 0 refills | Status: DC

## 2021-08-06 NOTE — Progress Notes (Signed)
I have spent 5 minutes in review of e-visit questionnaire, review and updating patient chart, medical decision making and response to patient.   Damaria Vachon, PA-C    

## 2021-08-06 NOTE — Progress Notes (Signed)
Visit for Asthma  Based on what you have shared with me, it looks like you may have a flare up of your asthma.  Asthma is a chronic (ongoing) lung disease which results in airway obstruction, inflammation and hyper-responsiveness.   Asthma symptoms vary from person to person, with common symptoms including nighttime awakening and decreased ability to participate in normal activities as a result of shortness of breath. It is often triggered by changes in weather, changes in the season, changes in air temperature, or inside (home, school, daycare or work) allergens such as animal dander, mold, mildew, woodstoves or cockroaches.   It can also be triggered by hormonal changes, extreme emotion, physical exertion or an upper respiratory tract illness.     It is important to identify the trigger, and then eliminate or avoid the trigger if possible.   If you have been prescribed medications to be taken on a regular basis, it is important to follow the asthma action plan and to follow guidelines to adjust medication in response to increasing symptoms of decreased peak expiratory flow rate  Treatment: I have prescribed: prednisone taper.  I believe your allergies may be triggering your asthma if you are experiencing wheezing.    HOME CARE Only take medications as instructed by your medical team. Consider wearing a mask or scarf to improve breathing air temperature have been shown to decrease irritation and decrease exacerbations Get rest. Taking a steamy shower or using a humidifier may help nasal congestion sand ease sore throat pain. You can place a towel over your head and breathe in the steam from hot water coming from a faucet. Using a saline nasal spray works much the same way.  Cough drops, hare candies and sore throat lozenges may ease your cough.  Avoid close contacts especially the very  you and the elderly Cover your mouth if you cough or sneeze Always remember to wash your hands.    GET HELP RIGHT AWAY IF: You develop worsening symptoms; breathlessness at rest, drowsy, confused or agitated, unable to speak in full sentences You have coughing fits You develop a severe headache or visual changes You develop shortness of breath, difficulty breathing or start having chest pain Your symptoms persist after you have completed your treatment plan If your symptoms do not improve within 10 days  MAKE SURE YOU Understand these instructions. Will watch your condition. Will get help right away if you are not doing well or get worse.   Your e-visit answers were reviewed by a board certified advanced clinical practitioner to complete your personal care plan, Depending upon the condition, your plan could have included both over the counter or prescription medications.   Please review your pharmacy choice. Your safety is important to [place]. If you have drug allergies check your prescription carefully.  You can use MyChart to ask questions about today's visit, request a non-urgent  call back, or ask for a work or school excuse for 24 hours related to this e-Visit. If it has been greater than 24 hours you will need to follow up with your provider, or enter a new e-Visit to address those concerns.   You will get an e-mail in the next two days asking about your experience. I hope that your e-visit has been valuable and will speed your recovery. Thank you for using e-visits.

## 2021-09-01 ENCOUNTER — Encounter: Payer: Medicaid Other | Admitting: Family Medicine

## 2021-09-13 ENCOUNTER — Telehealth: Payer: Medicaid Other | Admitting: Physician Assistant

## 2021-09-13 DIAGNOSIS — J019 Acute sinusitis, unspecified: Secondary | ICD-10-CM

## 2021-09-13 DIAGNOSIS — B9789 Other viral agents as the cause of diseases classified elsewhere: Secondary | ICD-10-CM

## 2021-09-14 MED ORDER — IPRATROPIUM BROMIDE 0.03 % NA SOLN: 2.0000 | Freq: Two times a day (BID) | NASAL | 0 refills | Status: DC

## 2021-09-14 NOTE — Progress Notes (Signed)

## 2021-09-14 NOTE — Progress Notes (Signed)
I have spent 5 minutes in review of e-visit questionnaire, review and updating patient chart, medical decision making and response to patient.   Ajooni Karam Cody Kaiea Esselman, PA-C    

## 2021-09-18 ENCOUNTER — Ambulatory Visit: Payer: Medicaid Other | Admitting: Family Medicine

## 2021-09-25 ENCOUNTER — Ambulatory Visit: Payer: Medicaid Other | Admitting: Family Medicine

## 2021-10-17 ENCOUNTER — Encounter: Payer: Self-pay | Admitting: Family Medicine

## 2021-10-17 ENCOUNTER — Telehealth: Payer: Self-pay | Admitting: Orthopedic Surgery

## 2021-10-17 ENCOUNTER — Telehealth: Payer: Medicaid Other | Admitting: Family Medicine

## 2021-10-17 DIAGNOSIS — R051 Acute cough: Secondary | ICD-10-CM | POA: Diagnosis not present

## 2021-10-17 DIAGNOSIS — J3089 Other allergic rhinitis: Secondary | ICD-10-CM

## 2021-10-17 MED ORDER — BENZONATATE 100 MG PO CAPS: 100.0000 mg | ORAL_CAPSULE | Freq: Two times a day (BID) | ORAL | 0 refills | Status: DC | PRN

## 2021-10-17 MED ORDER — LEVOCETIRIZINE DIHYDROCHLORIDE 5 MG PO TABS: 5.0000 mg | ORAL_TABLET | Freq: Every evening | ORAL | 0 refills | Status: DC

## 2021-10-17 MED ORDER — FLUTICASONE PROPIONATE 50 MCG/ACT NA SUSP: 2.0000 | Freq: Every day | NASAL | 0 refills | Status: DC

## 2021-10-17 NOTE — Progress Notes (Signed)
E visit for Allergic Rhinitis ?We are sorry that you are not feeling well.  Here is how we plan to help! ? ?Based on what you have shared with me it looks like you have Allergic Rhinitis.  Rhinitis is when a reaction occurs that causes nasal congestion, runny nose, sneezing, and itching.  Most types of rhinitis are caused by an inflammation and are associated with symptoms in the eyes ears or throat. ?There are several types of rhinitis.  The most common are acute rhinitis, which is usually caused by a viral illness, allergic or seasonal rhinitis, and nonallergic or year-round rhinitis.  Nasal allergies occur certain times of the year.  Allergic rhinitis is caused when allergens in the air trigger the release of histamine in the body.  Histamine causes itching, swelling, and fluid to build up in the fragile linings of the nasal passages, sinuses and eyelids.  An itchy nose and clear discharge are common. ? ?I recommend the following over the counter treatments: ?Xyzal 5 mg take 1 tablet daily ? ?I also would recommend a nasal spray: ?Flonase 2 sprays into each nostril once daily and Saline 1 spray into each nostril as needed ? ?I also have ordered some tessalon perles for you as well, these will help with a cough. ? ?HOME CARE: ? ?You can use an over-the-counter saline nasal spray as needed ?Avoid areas where there is heavy dust, mites, or molds ?Stay indoors on windy days during the pollen season ?Keep windows closed in home, at least in bedroom; use air conditioner. ?Use high-efficiency house air filter ?Keep windows closed in car, turn [facility] on re-circulate ?Avoid playing out with dog during pollen season ? ?GET HELP RIGHT AWAY IF: ? ?If your symptoms do not improve within 10 days ?You become short of breath ?You develop yellow or green discharge from your nose for over 3 days ?You have coughing fits ? ?MAKE SURE YOU: ? ?Understand these instructions ?Will watch your condition ?Will get help right away if you are  not doing well or get worse ? ?Thank you for choosing an e-visit. ?Your e-visit answers were reviewed by a board certified advanced clinical practitioner to complete your personal care plan. Depending upon the condition, your plan could have included both over the counter or prescription medications. ?Please review your pharmacy choice. Be sure that the pharmacy you have chosen is open so that you can pick up your prescription now.  If there is a problem you may message your provider in MyChart to have the prescription routed to another pharmacy. ?Your safety is important to [place]. If you have drug allergies check your prescription carefully.  ?For the next 24 hours, you can use MyChart to ask questions about today?s visit, request a non-urgent call back, or ask for a work or school excuse from your e-visit provider. ?You will get an email in the next two days asking about your experience. I hope that your e-visit has been valuable and will speed your recovery. ? ? ? ? ?I provided 5 minutes of non face-to-face time during this encounter for chart review, medication and order placement, as well as and documentation.  ? ? ? ?

## 2021-10-17 NOTE — Telephone Encounter (Signed)
Pt called requesting a call back from Dr. Frazier Butt. Pt states she doesn't want to make an appt but having complications with left hand after surgery in Dec. Please call pt at [phone]. ?

## 2021-10-19 ENCOUNTER — Ambulatory Visit: Payer: Medicaid Other | Admitting: Family Medicine

## 2021-10-19 ENCOUNTER — Ambulatory Visit: Payer: Medicaid Other | Admitting: Orthopedic Surgery

## 2021-10-19 ENCOUNTER — Encounter: Payer: Self-pay | Admitting: Orthopedic Surgery

## 2021-10-19 DIAGNOSIS — M67432 Ganglion, left wrist: Secondary | ICD-10-CM

## 2021-10-19 NOTE — Progress Notes (Signed)
? ?Office Visit Note ?  ?Patient: Natasha Martinez           ?Date of Birth: [birth year]           ?MRN: [id number] ?Visit Date: 10/19/2021 ?             ?Requested by: Georganna Skeans, MD ?[phone] [phone] ?[place],  [place] [address] ?PCP: Georganna Skeans, MD ? ? ?Assessment & Plan: ?Visit Diagnoses:  ?1. Ganglion cyst of dorsum of left wrist   ? ? ?Plan: Discussed with patient that I will have a great explanation for her recent and from when the sutures were removed in January 2 approximately 2 weeks ago.  She does describe global hand weakness.  Discussed trying hand therapy to work on overall hand strengthening and condition.  I can see her back after some time in therapy to see how she's progressed.  ? ?Follow-Up Instructions: No follow-ups on file.  ? ?Orders:  ?No orders of the defined types were placed in this encounter. ? ?No orders of the defined types were placed in this encounter. ? ? ? ? Procedures: ?No procedures performed ? ? ?Clinical Data: ?No additional findings. ? ? ?Subjective: ?Chief Complaint  ?Patient presents with  ? Left Hand - New Patient (Initial Visit)  ? ? ?This is a 26 year old female status post left dorsal ganglion cyst removal in December of last year who presents with recurrent pain at the dorsum of the wrist.  Her pain started back two weeks ago.  She describes burning aching pain that radiates from the incision to the middle and ring fingers.  She recently started a new job and notes that her pain is worse with prolonged activity.  She has had no recurrence of the lesion.  She denies any numbness or paresthesias in the hand. ? ? ?Review of Systems ? ? ?Objective: ?Vital Signs: There were no vitals taken for this visit. ? ?Physical Exam ?Constitutional:   ?   Appearance: Normal appearance.  ?Cardiovascular:  ?   Rate and Rhythm: Normal rate.  ?   Pulses: Normal pulses.  ?Pulmonary:  ?   Effort: Pulmonary effort is normal.  ?Skin: ?   General: Skin is warm and dry.  ?   Capillary  Refill: Capillary refill takes less than 2 seconds.  ?Neurological:  ?   Mental Status: She is alert.  ? ? ?Left Hand Exam  ? ?Tenderness  ?The patient is experiencing no tenderness.  ? ?Range of Motion  ?The patient has normal left wrist ROM. ? ?Other  ?Erythema: absent ?Sensation: normal ?Pulse: present ? ?Comments:  Well healed dorsal skin incision.  No recurrence of mass.  4/5 grip strength compared with contralateral side.  SILT in dorsal aspect of fingers. Able to fully extend all fingers against resistance.  ? ? ? ? ?Specialty Comments:  ?No specialty comments available. ? ?Imaging: ?No results found. ? ? ?PMFS History: ?Patient Active Problem List  ? Diagnosis Date Noted  ? Ganglion cyst of dorsum of left wrist 06/12/2021  ? Preeclampsia in postpartum period 11/07/2018  ? Anemia affecting pregnancy in third trimester 09/16/2018  ? Postpartum care following vaginal delivery 05/23/2018  ? Asthma in adult, mild intermittent, uncomplicated 05/23/2018  ? Chronic migraine w/o aura w/o status migrainosus, not intractable 08/04/2015  ? History of seizures as a child 08/04/2015  ? ?Past Medical History:  ?Diagnosis Date  ? Asthma   ? hasn't used inhaler in years  ? Migraine   ?  Seizures (HCC)   ? as a child, last time when 29 yrs old  ?  ?Family History  ?Problem Relation Age of Onset  ? Healthy Mother   ? Healthy Father   ?  ?Past Surgical History:  ?Procedure Laterality Date  ? GANGLION CYST EXCISION Left 07/03/2021  ? Procedure: EXCISION DORSAL GANGLION LEFT WRIST;  Surgeon: Marlyne Beards, MD;  Location: [facility];  Service: Orthopedics;  Laterality: Left;  ? Wisdom tooth removal    ? ?Social History  ? ?Occupational History  ? Not on file  ?Tobacco Use  ? Smoking status: Former  ?  Types: Cigars  ?  Quit date: 01/2018  ?  Years since quitting: 3.7  ? Smokeless tobacco: Never  ?Vaping Use  ? Vaping Use: Never used  ?Substance and Sexual Activity  ? Alcohol use: No  ? Drug use: Yes  ?  Frequency:  3.0 times per week  ?  Types: Marijuana  ? Sexual activity: Yes  ?  Birth control/protection: None  ? ? ? ? ? ? ?

## 2021-10-24 ENCOUNTER — Ambulatory Visit: Payer: Medicaid Other | Admitting: Family Medicine

## 2021-10-27 ENCOUNTER — Ambulatory Visit: Payer: Medicaid Other | Attending: Orthopedic Surgery | Admitting: Occupational Therapy

## 2021-10-27 ENCOUNTER — Encounter: Payer: Self-pay | Admitting: Occupational Therapy

## 2021-10-27 DIAGNOSIS — M6281 Muscle weakness (generalized): Secondary | ICD-10-CM | POA: Insufficient documentation

## 2021-10-27 DIAGNOSIS — M25632 Stiffness of left wrist, not elsewhere classified: Secondary | ICD-10-CM | POA: Insufficient documentation

## 2021-10-27 DIAGNOSIS — M25532 Pain in left wrist: Secondary | ICD-10-CM | POA: Diagnosis present

## 2021-10-27 NOTE — Patient Instructions (Signed)
AROM: Wrist Extension   .  With ____ palm down, bend wrist up. Repeat __15__ times per set.  Do __4-6__ sessions per day.       AROM: Wrist Flexion   With_____ palm up, bend wrist up. Repeat __15__ times per set.  Do _4-6___ sessions per day.    AROM: Forearm Pronation / Supination   With ____ arm in handshake position, slowly rotate palm down until stretch is felt. Relax. Then rotate palm up until stretch is felt. Repeat _15___ times per set. Do _4-6___ sessions per day.    

## 2021-10-27 NOTE — Therapy (Addendum)
?OUTPATIENT OCCUPATIONAL THERAPY ORTHO EVALUATION ? ?Patient Name: Natasha Martinez ?MRN: [id number] ?DOB:[birth year], 26 y.o., female ?Today's Date: 10/27/2021 ? ?PCP: Georganna SkeansWilson, Amelia, MD ?REFERRING PROVIDER: Marlyne BeardsBenfield, Charlie, MD ? ? OT End of Session - 10/27/21 [id number]   ? ? Visit Number 1   ? Number of Visits 5   ? Date for OT Re-Evaluation 12/08/21   ? Authorization Type [employer]   ? Authorization Time Period $4 copay   ? OT Start Time 0845   ? OT Stop Time 0930   ? OT Time Calculation (min) 45 min   ? Activity Tolerance Patient tolerated treatment well   ? [facility] for tasks assessed/performed   ? ?  ?  ? ?  ? ? ?Past Medical History:  ?Diagnosis Date  ? Asthma   ? hasn't used inhaler in years  ? Migraine   ? Seizures (HCC)   ? as a child, last time when 89 yrs old  ? ?Past Surgical History:  ?Procedure Laterality Date  ? GANGLION CYST EXCISION Left 07/03/2021  ? Procedure: EXCISION DORSAL GANGLION LEFT WRIST;  Surgeon: Marlyne BeardsBenfield, Charlie, MD;  Location: [facility];  Service: Orthopedics;  Laterality: Left;  ? Wisdom tooth removal    ? ?Patient Active Problem List  ? Diagnosis Date Noted  ? Ganglion cyst of dorsum of left wrist 06/12/2021  ? Preeclampsia in postpartum period 11/07/2018  ? Anemia affecting pregnancy in third trimester 09/16/2018  ? Postpartum care following vaginal delivery 05/23/2018  ? Asthma in adult, mild intermittent, uncomplicated 05/23/2018  ? Chronic migraine w/o aura w/o status migrainosus, not intractable 08/04/2015  ? History of seizures as a child 08/04/2015  ? ? ?ONSET DATE: 10/19/2021 referral date. Op 07/03/21 ? ?REFERRING DIAG: [id number] (ICD-10-CM) - Ganglion cyst of dorsum of left wrist  ? ?THERAPY DIAG:  ?Pain in left wrist - Plan: Ot plan of care cert/re-cert ? ?Stiffness of left wrist, not elsewhere classified - Plan: Ot plan of care cert/re-cert ? ?Muscle weakness (generalized) ? ?SUBJECTIVE:  ? ?SUBJECTIVE STATEMENT: ? ?Pt is a 26 year old female  that presents to OPOT s/p ganglion cyst excision of dorsum of left wrist on 07/03/21. Pt presents for targeting general hand weakness post op. Pt reports more pain and shaking in LUE and reports primary goal is to "try and get back functioning and not be so shaky and in so much pain".  ? ?Pt accompanied by: self ? ?PERTINENT HISTORY: None reported ? ?PRECAUTIONS: None ? ?WEIGHT BEARING RESTRICTIONS No ? ?PAIN:  ?Are you having pain? Yes: NPRS scale: 6/10 ?Pain location: Lt radial side of wrist ?Pain description: tightness ?Aggravating factors: the weather ?Relieving factors: not at the moment ? ?FALLS: Has patient fallen in last 6 months? No ? ?LIVING ENVIRONMENT: ?Lives with: lives with their daughter, 2 y/o daughter ?Lives in: House/apartment ?Stairs: 3rd floor apt ?Has following equipment at home: None ? 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 ?PATIENT GOALS "try and get back functioning and not be so shaky and in so much pain".  ? ?OBJECTIVE:  ? ?HAND DOMINANCE: Left ? ?ADLs: ?Overall ADLs: Pt is independent with all ADLs and IADLs however reports increased pain with lifting certain things.  ? ?Equipment: none ? ?FUNCTIONAL OUTCOME MEASURES: ?Quick Dash: 34.1% difficulty ? ?UE ROM    ? ?Active ROM Right ?10/27/2021 Left ?10/27/2021  ?Shoulder flexion WFL  ?Shoulder abduction   ?Shoulder adduction   ?Shoulder extension   ?  Shoulder internal rotation   ?Shoulder external rotation   ?Elbow flexion   ?Elbow extension   ?Wrist flexion 90 60  ?Wrist extension 70 35  ?Wrist ulnar deviation 35 10  ?Wrist radial deviation 15 15  ?Wrist pronation    ?Wrist supination    ?(Blank rows = not tested) ? ?Active ROM Right ?10/27/2021 Left ?10/27/2021  ?Thumb MCP (0-60) WFL  ?Thumb IP (0-80)   ?Thumb Radial abd/add (0-55)   ?Thumb Palmar abd/add (0-45)   ?Thumb Opposition to Small Finger   ?Index MCP (0-90)   ?Index PIP (0-100)   ?Index DIP (0-70)   ?Long MCP (0-90)   ?Long  PIP (0-100)   ?Long DIP (0-70)   ?Ring MCP (0-90)   ?Ring PIP (0-100)   ?Ring DIP (0-70)   ?Little MCP (0-90)   ?Little PIP (0-100)   ?Little DIP (0-70)   ?(Blank rows = not tested) ? ? ?UE MMT:    ? ?MMT Right ?10/27/2021 Left ?10/27/2021  ?Shoulder flexion WFL  ?Shoulder abduction   ?Shoulder adduction   ?Shoulder extension   ?Shoulder internal rotation   ?Shoulder external rotation   ?Middle trapezius   ?Lower trapezius   ?Elbow flexion   ?Elbow extension   ?Wrist flexion    ?Wrist extension    ?Wrist ulnar deviation    ?Wrist radial deviation    ?Wrist pronation    ?Wrist supination    ?(Blank rows = not tested) ? ?HAND FUNCTION: ?Grip strength: Right: 74.7 lbs; Left: 26.4 lbs, Lateral pinch: Right: 24 lbs, Left: 11 lbs, 3 point pinch: Right: 24 lbs, Left: 10 lbs, and Tip pinch: Right 18 lbs, Left: 10 lbs ? ?COORDINATION: ?9 Hole Peg test: Right: 18.31 sec; Left: 16.32 sec ? ?SENSATION: Reports pins and needles in median nerve innervation on LUE hand. ?Hot/Cold: WFL ? ?EDEMA: none  ? ?COGNITION: ?Overall cognitive status: Within functional limits for tasks assessed ? ? ? ? ?TODAY'S TREATMENT:  ?Ultrasound 0.8w/cm2, 8 minutes, dorsal wrist LUE, 1 mhz, 20% duty cycle pulsed ? ?Tendon Glides x 10 reps. LUE ? ?Hand/Wrist AROM ?AROM: Wrist Extension ? ? ?.  ?With ____ palm down, bend wrist up. ?Repeat __15__ times per set.  Do __4-6__ sessions per day. ? ? ? ?AROM: Wrist Flexion ? ? ?With_____ palm up, bend wrist up. ?Repeat __15__ times per set.  Do _4-6___ sessions per day. ? ? ?AROM: Forearm Pronation / Supination ? ? ?With ____ arm in handshake position, slowly rotate palm down until stretch is felt. Relax. Then rotate palm up until stretch is felt. ?Repeat _15___ times per set. Do _4-6___ sessions per day. ? ?Copyright ? VHI. All rights reserved.  ? ?  ? ? ?PATIENT EDUCATION: ?Education details: Education on role and purpose of OT ?Person educated: Patient ?Education method: Explanation ?Education comprehension:  verbalized understanding ? ? ?HOME EXERCISE PROGRAM: ?10/27/21 Tendon Glides, Hand/Wrist AROM ? ?GOALS: ?Goals reviewed with patient? No ? ?SHORT TERM GOALS: Target date: 11/17/2021 ? ?Pt will be independent with initial HEP ?Baseline: issued AROM wrist and tendon glides at eval ?Goal status: IN PROGRESS ? ?2.  Pt will verbalize understanding of pain management strategies. ?Baseline: briefly reviewed heat at eval ?Goal status: INITIAL ? ? ?LONG TERM GOALS: Target date: 12/08/2021 ? ?Pt will be independent with updated HEP targeting grip strength and ROM  ?Baseline: continue to update ?Goal status: INITIAL ? ?2.  Pt will increase grip strength in LUE by 10 lbs or greater for increasing functional use  of dominant hand ?Baseline: Right: 74.7 lbs; Left: 26.4 lbs ?Goal status: INITIAL ? ?3.  Pt will increase LUE strength in lateral pinch and/or 3 point pinch by 3 lbs or greater. ?Baseline: Lateral pinch: Right: 24 lbs, Left: 11 lbs, 3 point pinch: Right: 24 lbs, Left: 10 lbs ?Goal status: INITIAL ? ?4.  Pt will report pain no greater than 4/10 with completion of HEPs ?Baseline: 6/10 at eval ?Goal status: INITIAL ? ?5. Pt will increase wrist extension to 45 degrees or greater in LUE in order to increase ability to weight bear and use LUE more functionally. ? Baseline: 35 degrees ? Goal status: INITIAL ? ?ASSESSMENT: ? ?CLINICAL IMPRESSION: ?Patient is a 26 y.o. female who was seen today for occupational therapy evaluation for general hand weakness s/p ganglion cyst excision in December 2022. Pt presents with decreased grip and pinch strength in LUE, some sensory deficits, increased pain and general pain in LUE hand and wrist . Skilled occupational therapy is recommended to target listed areas of deficit and increase independence with ADLs and IADLs and decrease caregiver burden.  ? ?PERFORMANCE DEFICITS in functional skills including ADLs, IADLs, coordination, ROM, strength, pain, decreased knowledge of use of DME, and UE  functional use. ? ?IMPAIRMENTS are limiting patient from ADLs, IADLs, work, and leisure.  ? ?COMORBIDITIES has no other co-morbidities that affects occupational performance. Patient will benefit from skilled

## 2021-10-27 NOTE — Addendum Note (Signed)
Addended by: Junious Dresser on: 10/27/2021 10:51 AM ? ? Modules accepted: Orders ? ?

## 2021-11-03 ENCOUNTER — Ambulatory Visit: Payer: Medicaid Other | Admitting: Occupational Therapy

## 2021-11-03 ENCOUNTER — Encounter: Payer: Self-pay | Admitting: Occupational Therapy

## 2021-11-03 DIAGNOSIS — M25532 Pain in left wrist: Secondary | ICD-10-CM

## 2021-11-03 DIAGNOSIS — M6281 Muscle weakness (generalized): Secondary | ICD-10-CM

## 2021-11-03 DIAGNOSIS — M25632 Stiffness of left wrist, not elsewhere classified: Secondary | ICD-10-CM

## 2021-11-03 NOTE — Therapy (Signed)
?OUTPATIENT OCCUPATIONAL THERAPY TREATMENT NOTE ? ? ?Patient Name: Joannie Medine ?MRN: [id number] ?DOB:05/07/1996, 26 y.o., female ?Today's Date: 11/03/2021 ? ?PCP: Georganna Skeans, MD ?REFERRING PROVIDER: Georganna Skeans, MD ? ?END OF SESSION:  ? OT End of Session - 11/03/21 1317   ? ? Visit Number 2   ? Number of Visits 5   ? Date for OT Re-Evaluation 12/08/21   ? Authorization Type [employer]   ? Authorization Time Period $4 copay   ? OT Start Time 1316   ? OT Stop Time 1355   ? OT Time Calculation (min) 39 min   ? Activity Tolerance Patient tolerated treatment well   ? Behavior During Therapy [facility] for tasks assessed/performed   ? ?  ?  ? ?  ? ? ?Past Medical History:  ?Diagnosis Date  ? Asthma   ? hasn't used inhaler in years  ? Migraine   ? Seizures (HCC)   ? as a child, last time when 54 yrs old  ? ?Past Surgical History:  ?Procedure Laterality Date  ? GANGLION CYST EXCISION Left 07/03/2021  ? Procedure: EXCISION DORSAL GANGLION LEFT WRIST;  Surgeon: Marlyne Beards, MD;  Location: [facility];  Service: Orthopedics;  Laterality: Left;  ? Wisdom tooth removal    ? ?Patient Active Problem List  ? Diagnosis Date Noted  ? Ganglion cyst of dorsum of left wrist 06/12/2021  ? Preeclampsia in postpartum period 11/07/2018  ? Anemia affecting pregnancy in third trimester 09/16/2018  ? Postpartum care following vaginal delivery 05/23/2018  ? Asthma in adult, mild intermittent, uncomplicated 05/23/2018  ? Chronic migraine w/o aura w/o status migrainosus, not intractable 08/04/2015  ? History of seizures as a child 08/04/2015  ? ? ?ONSET DATE: 10/19/2021 referral date. Op 07/03/21 ? ?REFERRING DIAG: T02.409 (ICD-10-CM) - Ganglion cyst of dorsum of left wrist  ? ?THERAPY DIAG:  ?Pain in left wrist ? ?Stiffness of left wrist, not elsewhere classified ? ?Muscle weakness (generalized) ? ? ?PERTINENT HISTORY: None reported ? ?PRECAUTIONS: None ? ?SUBJECTIVE: "I drove to [place] last weekend so that may have made  it stiffer" ? ?PAIN:  ?Are you having pain? Yes: NPRS scale: 6/10 ?Pain location: LUE dorsal wrist ?Pain description: achy/sore ?Aggravating factors: weather ?Relieving factors: none reported ? ? ? ? ?OBJECTIVE:  ? ?TODAY'S TREATMENT: ? ?11/03/21 ? ?Fluidotherapy x 12 minutes for LUE to address pain, swelling, and stiffness. No adverse reactions.  Pt performed tendon glides and AROM for wrist and hand while in modality.  ? ?Ultrasound 0.8w/cm2, 8 minutes, dorsal wrist LUE, 1 mhz, 20% duty cycle pulsed. No adverse reactions. ? ?P/ROM for LUE wrist  ? ?Yellow Theraputty issued for LUE ?1. Grip Strengthening (Resistive Putty) ? ? ?Squeeze putty using thumb and all fingers. ?Repeat _20___ times. Do __2__ sessions per day. ? ? ?2. Roll putty into tube on table and pinch between each finger and thumb x 10 reps each. (can do ring and small finger together) ? ?Copyright ? VHI. All rights reserved.  ? ? ?PATIENT EDUCATION: ?Education details: reviewed OT goals. Issued yellow theraputty. PROM LUE wrist ?Person educated: Patient ?Education method: Explanation and Demonstration ?Education comprehension: verbalized understanding and returned demonstration  ?  ?  ?HOME EXERCISE PROGRAM: ?11/03/21 P/ROM for wrist LUE, yellow theraputty ?10/27/21 Tendon Glides, Hand/Wrist AROM ?  ?GOALS: ?Goals reviewed with patient? No ?  ?SHORT TERM GOALS: Target date: 11/17/2021 ?  ?Pt will be independent with initial HEP ?Baseline: issued AROM wrist and tendon glides at  eval ?Goal status: IN PROGRESS ?  ?2.  Pt will verbalize understanding of pain management strategies. ?Baseline: briefly reviewed heat at eval ?Goal status: ONGOING ?  ?  ?LONG TERM GOALS: Target date: 12/08/2021 ?  ?Pt will be independent with updated HEP targeting grip strength and ROM  ?Baseline: continue to update ?Goal status: INITIAL ?  ?2.  Pt will increase grip strength in LUE by 10 lbs or greater for increasing functional use of dominant hand ?Baseline: Right: 74.7 lbs; Left:  26.4 lbs ?Goal status: ONGOING ?  ?3.  Pt will increase LUE strength in lateral pinch and/or 3 point pinch by 3 lbs or greater. ?Baseline: Lateral pinch: Right: 24 lbs, Left: 11 lbs, 3 point pinch: Right: 24 lbs, Left: 10 lbs ?Goal status: ONGOING ?  ?4.  Pt will report pain no greater than 4/10 with completion of HEPs ?Baseline: 6/10 at eval ?Goal status: INITIAL ?  ?5. Pt will increase wrist extension to 45 degrees or greater in LUE in order to increase ability to weight bear and use LUE more functionally. ?            Baseline: 35 degrees ?            Goal status: INITIAL ?  ?ASSESSMENT: ?  ?CLINICAL IMPRESSION: ?Pt with some stiffness today but no adverse reactions to modalities and demonstrating increased ROM with wrist extension.   ?  ?PERFORMANCE DEFICITS in functional skills including ADLs, IADLs, coordination, ROM, strength, pain, decreased knowledge of use of DME, and UE functional use. ?  ?IMPAIRMENTS are limiting patient from ADLs, IADLs, work, and leisure.  ?  ?COMORBIDITIES has no other co-morbidities that affects occupational performance. Patient will benefit from skilled OT to address above impairments and improve overall function. ?  ?MODIFICATION OR ASSISTANCE TO COMPLETE EVALUATION: No modification of tasks or assist necessary to complete an evaluation. ?  ?OT OCCUPATIONAL PROFILE AND HISTORY: Problem focused assessment: Including review of records relating to presenting problem. ?  ?CLINICAL DECISION MAKING: LOW - limited treatment options, no task modification necessary ?  ?REHAB POTENTIAL: Good ?  ?EVALUATION COMPLEXITY: Low ?  ?  ?  ?PLAN: ?OT FREQUENCY: 1x/week ?  ?OT DURATION: 6 weeks ?  ?PLANNED INTERVENTIONS: self care/ADL training, therapeutic exercise, therapeutic activity, manual therapy, scar mobilization, passive range of motion, splinting, electrical stimulation, ultrasound, fluidotherapy, moist heat, cryotherapy, patient/family education, and DME and/or AE instructions ?   ?RECOMMENDED OTHER SERVICES: None ?  ?CONSULTED AND AGREED WITH PLAN OF CARE: Patient ?  ?PLAN FOR NEXT SESSION: see how putty and PROM are going with HEP, start isometrics and light strengthening PRN. ? ? ?Junious Dresser, OT ?11/03/2021, 1:53 PM ? ?  ? ? ? ?

## 2021-11-03 NOTE — Patient Instructions (Signed)
1. Grip Strengthening (Resistive Putty)   Squeeze putty using thumb and all fingers. Repeat _20___ times. Do __2__ sessions per day.   2. Roll putty into tube on table and pinch between each finger and thumb x 10 reps each. (can do ring and small finger together)     Copyright  VHI. All rights reserved.   

## 2021-11-10 ENCOUNTER — Ambulatory Visit: Payer: Medicaid Other | Admitting: Occupational Therapy

## 2021-11-10 DIAGNOSIS — M25532 Pain in left wrist: Secondary | ICD-10-CM | POA: Diagnosis not present

## 2021-11-10 DIAGNOSIS — M6281 Muscle weakness (generalized): Secondary | ICD-10-CM

## 2021-11-10 DIAGNOSIS — M25632 Stiffness of left wrist, not elsewhere classified: Secondary | ICD-10-CM

## 2021-11-10 NOTE — Therapy (Signed)
?OUTPATIENT OCCUPATIONAL THERAPY TREATMENT NOTE ? ? ?Patient Name: Natasha Martinez ?MRN: [id number] ?DOB:[birth year], 26 y.o., female ?Today's Date: 11/10/2021 ? ?PCP: Georganna Skeans, MD ?REFERRING PROVIDER: Georganna Skeans, MD ? ?END OF SESSION:  ? OT End of Session - 11/10/21 1316   ? ? Visit Number 3   ? Number of Visits 5   ? Date for OT Re-Evaluation 12/08/21   ? Authorization Type [employer]   ? Authorization Time Period $4 copay   ? OT Start Time 1314   ? OT Stop Time 1345   ? OT Time Calculation (min) 31 min   ? Activity Tolerance Patient tolerated treatment well   ? Behavior During Therapy [facility] for tasks assessed/performed   ? ?  ?  ? ?  ? ? ? ?Past Medical History:  ?Diagnosis Date  ? Asthma   ? hasn't used inhaler in years  ? Migraine   ? Seizures (HCC)   ? as a child, last time when 73 yrs old  ? ?Past Surgical History:  ?Procedure Laterality Date  ? GANGLION CYST EXCISION Left 07/03/2021  ? Procedure: EXCISION DORSAL GANGLION LEFT WRIST;  Surgeon: Marlyne Beards, MD;  Location: [facility];  Service: Orthopedics;  Laterality: Left;  ? Wisdom tooth removal    ? ?Patient Active Problem List  ? Diagnosis Date Noted  ? Ganglion cyst of dorsum of left wrist 06/12/2021  ? Preeclampsia in postpartum period 11/07/2018  ? Anemia affecting pregnancy in third trimester 09/16/2018  ? Postpartum care following vaginal delivery 05/23/2018  ? Asthma in adult, mild intermittent, uncomplicated 05/23/2018  ? Chronic migraine w/o aura w/o status migrainosus, not intractable 08/04/2015  ? History of seizures as a child 08/04/2015  ? ? ?ONSET DATE: 10/19/2021 referral date. Op 07/03/21 ? ?REFERRING DIAG: W40.973 (ICD-10-CM) - Ganglion cyst of dorsum of left wrist  ? ?THERAPY DIAG:  ?Pain in left wrist ? ?Muscle weakness (generalized) ? ?Stiffness of left wrist, not elsewhere classified ? ? ?PERTINENT HISTORY: None reported ? ?PRECAUTIONS: None ? ?SUBJECTIVE: "I tripped and fell on my hand so it's been hurting  here (radial wrist)" Pt reports decreased pain at ganglion cyst surgical site. ? ?PAIN:  ?Are you having pain? Yes: NPRS scale: 8/10 ?Pain location: LUE radial side of wrist ?Pain description: sharp ?Aggravating factors: none ?Relieving factors: hurting on and off ? ? ? ? ?OBJECTIVE:  ? ?TODAY'S TREATMENT: ? ?11/10/21 ?Negative Finklestein Test d/t new pain with fall this past week.  ? ?Fluidotherapy x 12 minutes for LUE to address pain, swelling, and stiffness. No adverse reactions.  Pt performed tendon glides and AROM for wrist and hand while in modality.  ? ?Ice Pack to L radial side of wrist for new onset of pain x 8 minutes. Pt tolerated well. ? ?OT opted not to perform ultrasound and other exercises for LUE including isometrics and PROM, as planned, d/t new onset of pain. Encouraged patient to seek out medical care from MD if pain persists. ? ?  ?HOME EXERCISE PROGRAM: ?11/10/21 added isometrics to LUE wrist ?11/03/21 P/ROM for wrist LUE, yellow theraputty ?10/27/21 Tendon Glides, Hand/Wrist AROM ?  ?GOALS: ?Goals reviewed with patient? No ?  ?SHORT TERM GOALS: Target date: 11/17/2021 ?  ?Pt will be independent with initial HEP ?Baseline: issued AROM wrist and tendon glides at eval ?Goal status: ACHIEVED ?  ?2.  Pt will verbalize understanding of pain management strategies. ?Baseline: briefly reviewed heat at eval ?Goal status: ACHIEVED ?  ?  ?LONG  TERM GOALS: Target date: 12/08/2021 ?  ?Pt will be independent with updated HEP targeting grip strength and ROM  ?Baseline: continue to update ?Goal status: ONGOING ?  ?2.  Pt will increase grip strength in LUE by 10 lbs or greater for increasing functional use of dominant hand ?Baseline: Right: 74.7 lbs; Left: 26.4 lbs ?Goal status: ONGOING ?  ?3.  Pt will increase LUE strength in lateral pinch and/or 3 point pinch by 3 lbs or greater. ?Baseline: Lateral pinch: Right: 24 lbs, Left: 11 lbs, 3 point pinch: Right: 24 lbs, Left: 10 lbs ?Goal status: ONGOING ?  ?4.  Pt will  report pain no greater than 4/10 with completion of HEPs ?Baseline: 6/10 at eval ?Goal status: INITIAL ?  ?5. Pt will increase wrist extension to 45 degrees or greater in LUE in order to increase ability to weight bear and use LUE more functionally. ?            Baseline: 35 degrees ?            Goal status: ONGOING ?  ?ASSESSMENT: ?  ?CLINICAL IMPRESSION: ?Pt with new onset of pain in LUE radial side of wrist d/t fall on hand at work this past week. Pt reports significant sharp pain (8/10). Negative Lourena Simmonds Test completed today. Encouraged patient to follow up with MD re: new pain if continues. ?  ?PERFORMANCE DEFICITS in functional skills including ADLs, IADLs, coordination, ROM, strength, pain, decreased knowledge of use of DME, and UE functional use. ?  ?IMPAIRMENTS are limiting patient from ADLs, IADLs, work, and leisure.  ?  ?COMORBIDITIES has no other co-morbidities that affects occupational performance. Patient will benefit from skilled OT to address above impairments and improve overall function. ?  ?MODIFICATION OR ASSISTANCE TO COMPLETE EVALUATION: No modification of tasks or assist necessary to complete an evaluation. ?  ?OT OCCUPATIONAL PROFILE AND HISTORY: Problem focused assessment: Including review of records relating to presenting problem. ?  ?CLINICAL DECISION MAKING: LOW - limited treatment options, no task modification necessary ?  ?REHAB POTENTIAL: Good ?  ?EVALUATION COMPLEXITY: Low ?  ?  ?  ?PLAN: ?OT FREQUENCY: 1x/week ?  ?OT DURATION: 6 weeks ?  ?PLANNED INTERVENTIONS: self care/ADL training, therapeutic exercise, therapeutic activity, manual therapy, scar mobilization, passive range of motion, splinting, electrical stimulation, ultrasound, fluidotherapy, moist heat, cryotherapy, patient/family education, and DME and/or AE instructions ?  ?RECOMMENDED OTHER SERVICES: None ?  ?CONSULTED AND AGREED WITH PLAN OF CARE: Patient ?  ?PLAN FOR NEXT SESSION: isometrics if pain has subsided from  left radial side of wrist, PROM ? ? ?Junious Dresser, OT ?11/10/2021, 2:02 PM ? ?  ? ? ? ?

## 2021-11-17 ENCOUNTER — Ambulatory Visit: Payer: Medicaid Other | Admitting: Occupational Therapy

## 2021-11-24 ENCOUNTER — Ambulatory Visit: Payer: Medicaid Other | Attending: Orthopedic Surgery | Admitting: Occupational Therapy

## 2021-12-26 ENCOUNTER — Telehealth: Payer: Self-pay | Admitting: Orthopedic Surgery

## 2021-12-26 NOTE — Telephone Encounter (Signed)
Appt  made for 01/04/22 @ 815

## 2021-12-26 NOTE — Telephone Encounter (Signed)
Patient called advised the cyst is coming back on her left wrist.  Patient wanted a call back before she schedules another appointment. The number to contact patient is [phone]

## 2021-12-26 NOTE — Telephone Encounter (Signed)
I called, she wanted to know if they can grow back and I advised that they can grow back.

## 2022-01-04 ENCOUNTER — Encounter: Payer: Self-pay | Admitting: Orthopedic Surgery

## 2022-01-04 ENCOUNTER — Ambulatory Visit (INDEPENDENT_AMBULATORY_CARE_PROVIDER_SITE_OTHER): Payer: Medicaid Other | Admitting: Orthopedic Surgery

## 2022-01-04 DIAGNOSIS — M67432 Ganglion, left wrist: Secondary | ICD-10-CM | POA: Diagnosis not present

## 2022-01-04 NOTE — Progress Notes (Signed)
Office Visit Note   Patient: Natasha Martinez           Date of Birth: [birth year]           MRN: [id number] Visit Date: 01/04/2022              Requested by: Georganna Skeans, MD [address] [place],  [place] [address] PCP: Georganna Skeans, MD   Assessment & Plan: Visit Diagnoses:  1. Ganglion cyst of dorsum of left wrist     Plan: Patient is status post left dorsal ganglion cyst excision on 07/03/2022.  She was doing well until about 1 week ago when the mass seemingly recurred.  She notes that she had a large, round mass distal to her incision last week but this has resolved.  Now she has painful swelling around the incision site.  There is no erythema or induration to suggest infection.  This is mildly tender to palpation.  I would like an ultrasound of the area to further characterize the swelling given that the mass is rather amorphous.  She can back and see me after the ultrasound is completed we can discuss the results over the phone.  Follow-Up Instructions: No follow-ups on file.   Orders:  No orders of the defined types were placed in this encounter.  No orders of the defined types were placed in this encounter.     Procedures: No procedures performed   Clinical Data: No additional findings.   Subjective: Chief Complaint  Patient presents with   Left Wrist - Follow-up    This is a 26 year old female presents with a seemingly recurrent mass of the dorsal aspect of the left wrist.  She underwent dorsal ganglion cyst excision on 07/03/2022.  The mass seemingly recurred around 1 week ago.  She noticed a round, swollen area distal to her incision.  This particular mass resolved but she developed amorphous swelling directly over the incision.  This is mildly tender palpation.  She has some mild pain with terminal extension.    Review of Systems   Objective: Vital Signs: There were no vitals taken for this visit.  Physical Exam Constitutional:       Appearance: Normal appearance.  Cardiovascular:     Rate and Rhythm: Normal rate.     Pulses: Normal pulses.  Pulmonary:     Effort: Pulmonary effort is normal.  Skin:    General: Skin is warm and dry.     Capillary Refill: Capillary refill takes less than 2 seconds.  Neurological:     Mental Status: She is alert.     Left Hand Exam   Tenderness  Left hand tenderness location: Mild TTP at dorsal wrist directly over incision.   Range of Motion  The patient has normal left wrist ROM.  Other  Erythema: absent Sensation: normal Pulse: present  Comments:  Amorphous swelling directly over incision.  Hard to palpate a discrete mass.  No erythema or induration.       Specialty Comments:  No specialty comments available.  Imaging: No results found.   PMFS History: Patient Active Problem List   Diagnosis Date Noted   Ganglion cyst of dorsum of left wrist 06/12/2021   Preeclampsia in postpartum period 11/07/2018   Anemia affecting pregnancy in third trimester 09/16/2018   Postpartum care following vaginal delivery 05/23/2018   Asthma in adult, mild intermittent, uncomplicated 05/23/2018   Chronic migraine w/o aura w/o status migrainosus, not intractable 08/04/2015   History of seizures  as a child 08/04/2015   Past Medical History:  Diagnosis Date   Asthma    hasn't used inhaler in years   Migraine    Seizures (HCC)    as a child, last time when 64 yrs old    Family History  Problem Relation Age of Onset   Healthy Mother    Healthy Father     Past Surgical History:  Procedure Laterality Date   GANGLION CYST EXCISION Left 07/03/2021   Procedure: EXCISION DORSAL GANGLION LEFT WRIST;  Surgeon: Marlyne Beards, MD;  Location: [facility];  Service: Orthopedics;  Laterality: Left;   Wisdom tooth removal     Social History   Occupational History   Not on file  Tobacco Use   Smoking status: Former    Types: Cigars    Quit date: 01/2018     Years since quitting: 3.9   Smokeless tobacco: Never  Vaping Use   Vaping Use: Never used  Substance and Sexual Activity   Alcohol use: No   Drug use: Yes    Frequency: 3.0 times per week    Types: Marijuana   Sexual activity: Yes    Birth control/protection: None

## 2022-01-09 ENCOUNTER — Ambulatory Visit
Admission: RE | Admit: 2022-01-09 | Discharge: 2022-01-09 | Disposition: A | Discharge status: Home / Self Care | Payer: Medicaid Other | Source: Ambulatory Visit | Attending: Orthopedic Surgery | Admitting: Orthopedic Surgery

## 2022-01-09 DIAGNOSIS — M67432 Ganglion, left wrist: Secondary | ICD-10-CM

## 2022-01-09 DIAGNOSIS — R2232 Localized swelling, mass and lump, left upper limb: Secondary | ICD-10-CM | POA: Diagnosis not present

## 2022-01-16 ENCOUNTER — Telehealth: Payer: Medicaid Other | Admitting: Emergency Medicine

## 2022-01-16 DIAGNOSIS — K0889 Other specified disorders of teeth and supporting structures: Secondary | ICD-10-CM

## 2022-01-16 MED ORDER — NAPROXEN 500 MG PO TABS: 500.0000 mg | ORAL_TABLET | Freq: Two times a day (BID) | ORAL | 0 refills | Status: DC

## 2022-01-16 MED ORDER — PENICILLIN V POTASSIUM 500 MG PO TABS: 500.0000 mg | ORAL_TABLET | Freq: Three times a day (TID) | ORAL | 0 refills | Status: AC

## 2022-02-08 ENCOUNTER — Encounter: Payer: Medicaid Other | Admitting: Family Medicine

## 2022-03-12 ENCOUNTER — Telehealth: Payer: Medicaid Other | Admitting: Physician Assistant

## 2022-03-12 DIAGNOSIS — K047 Periapical abscess without sinus: Secondary | ICD-10-CM | POA: Diagnosis not present

## 2022-03-12 MED ORDER — PENICILLIN V POTASSIUM 500 MG PO TABS
500.0000 mg | ORAL_TABLET | Freq: Three times a day (TID) | ORAL | 0 refills | Status: AC
Start: 2022-03-12 — End: 2022-03-22

## 2022-03-12 MED ORDER — IBUPROFEN 600 MG PO TABS: 600.0000 mg | ORAL_TABLET | Freq: Three times a day (TID) | ORAL | 0 refills | Status: DC | PRN

## 2022-03-12 NOTE — Progress Notes (Signed)

## 2022-03-18 ENCOUNTER — Telehealth: Payer: Medicaid Other | Admitting: Physician Assistant

## 2022-03-18 DIAGNOSIS — L2082 Flexural eczema: Secondary | ICD-10-CM

## 2022-03-18 DIAGNOSIS — L2084 Intrinsic (allergic) eczema: Secondary | ICD-10-CM

## 2022-03-18 MED ORDER — TRIAMCINOLONE ACETONIDE 0.1 % EX CREA: 1.0000 | TOPICAL_CREAM | Freq: Two times a day (BID) | CUTANEOUS | 0 refills | Status: DC

## 2022-03-18 NOTE — Progress Notes (Signed)
E-Visit for Eczema ? ?We are sorry that you are not feeling well. Here is how we plan to help! ?Based on what you shared with me it looks like you have eczema (atopic dermatitis).  Although the cause of eczema is not completely understood, genetics appear to play a strong role, and people with a family history of eczema are at increased risk of developing the condition. In most people with eczema, there is a genetic abnormality in the outermost layer of the skin, called the epidermis  ? ?Most people with eczema develop their first symptoms as children, before the age of five. Intense itching of the skin, patches of redness, small bumps, and skin flaking are common. Scratching can further inflame the skin and worsen the itching. The itchiness may be more noticeable at nighttime. ? ?Eczema commonly affects the back of the neck, the elbow creases, and the backs of the knees. Other affected areas may include the face, wrists, and forearms. The skin may become thickened and darkened, or even scarred, from repeated scratching. ?Eliminating factors that aggravate your eczema symptoms can help to control the symptoms. Possible triggers may include: ?? Cold or dry environments ?? Sweating ?? Emotional stress or anxiety ?? Rapid temperature changes ?? Exposure to certain chemicals or cleaning solutions, including soaps and detergents, perfumes and cosmetics, wool or synthetic fibers, dust, sand, and cigarette smoke ?Keeping your skin hydrated ?Emollients -- Emollients are creams and ointments that moisturize the skin and prevent it from drying out. The best emollients for people with eczema are thick creams (such as Eucerin, Cetaphil, and Nutraderm) or ointments (such as petroleum jelly, Aquaphor, and Vaseline), which contain little to no water. Emollients are most effective when applied immediately after bathing. Emollients can be applied twice a day or more often if needed. Lotions contain more water than creams and  ointments and are less effective for moisturizing the skin. ?Bathing -- It is not clear if showers or baths are better for keeping the skin hydrated. Lukewarm baths or showers can hydrate and cool the skin, temporarily relieving itching from eczema. An unscented, mild soap or non-soap cleanser (such as Cetaphil) should be used sparingly. Apply an emollient immediately after bathing or showering to prevent your skin from drying out as a result of water evaporation. Emollient bath additives (products you add to the bath water) have not been found to help relieve symptoms. ?Hot or long baths (more than 10 to 15 minutes) and showers should be avoided since they can dry out the skin. ? ?Based on what you shared with me you may have eczema.  ? ?I have prescribed: and Triamcinalone ointment (or cream). Apply to the effected areas twice per day. ? ?I recommend dilute bleach baths for people with eczema. These baths help to decrease the number of bacteria on the skin that can cause infections or worsen symptoms. To prepare a bleach bath, one-fourth to one-half cup of bleach is placed in a full bathtub (about 40 gallons) of water. Bleach baths are usually taken for 5 to 10 minutes twice per week and should be followed by application of an emollient (listed above). ?I recommend you take Benadryl 25mg - 50mg every 4 hours to control the symptoms (including itching) but if they last over 24 hours it is best that you see an office based provider for follow up. ? ?HOME CARE: ?Take lukewarm showers or baths ?Apply creams and ointments to prevent the skin from drying (Eucerin, Cetaphil, Nutraderm, petroleum jelly, Aquaphor or   Vaseline) - these products contain less water than other lotions and are more effective for moisturizing the skin ?Limit exposure to cold or dry environments, sweating, emotional stress and anxiety, rapid temperature changes and exposure to chemicals and cleaning products, soaps and detergents, perfumes,  cosmetics, wool and synthetic fibers, dust, sand and cigarette- factors which can aggravate eczema symptoms.  ?Use a hydrocortisone cream once or twice a day ?Take an antihistamine like Benadryl for widespread rashes that itch.  The adult dosage of Benadryl is 25-50 mg by mouth 4 times daily. ?Caution: This type of medication may cause sleepiness.  Do not drink alcohol, drive, or operate dangerous machinery while taking antihistamines.  Do not take these medications if you have prostate enlargement.  Read the package instructions thoroughly on all medications that you take.  ?GET HELP RIGHT AWAY IF: ?Symptoms that don't go away after treatment. ?Severe itching that persists. ?You develop a fever. ?Your skin begins to drain. ?You have a sore throat. ?You become short of breath. ? ?MAKE SURE YOU  ? ?Understand these instructions. ?Will watch your condition. ?Will get help right away if you are not doing well or get worse. ? ? ?Thank you for choosing an e-visit. ? ?Your e-visit answers were reviewed by a board certified advanced clinical practitioner to complete your personal care plan. Depending upon the condition, your plan could have included both over the counter or prescription medications. ? ?Please review your pharmacy choice. Make sure the pharmacy is open so you can pick up prescription now. If there is a problem, you may contact your provider through MyChart messaging and have the prescription routed to another pharmacy.  Your safety is important to us. If you have drug allergies check your prescription carefully.  ? ?For the next 24 hours you can use MyChart to ask questions about today's visit, request a non-urgent call back, or ask for a work or school excuse. ?You will get an email in the next two days asking about your experience. I hope that your e-visit has been valuable and will speed your recovery. ? ?I provided 5 minutes of non face-to-face time during this encounter for chart review and  documentation.  ? ?

## 2022-03-22 ENCOUNTER — Telehealth: Payer: Self-pay | Admitting: Orthopedic Surgery

## 2022-03-22 NOTE — Telephone Encounter (Signed)
Pt called requesting a call back from dr Frazier Butt. She states she has medical questions about her wrist. Please call pt at [phone].

## 2022-04-18 ENCOUNTER — Ambulatory Visit: Payer: Medicaid Other | Admitting: Family Medicine

## 2022-06-24 ENCOUNTER — Telehealth: Payer: Self-pay | Admitting: Family

## 2022-06-24 DIAGNOSIS — H109 Unspecified conjunctivitis: Secondary | ICD-10-CM

## 2022-06-24 MED ORDER — POLYMYXIN B-TRIMETHOPRIM 10000-0.1 UNIT/ML-% OP SOLN: 1.0000 [drp] | Freq: Four times a day (QID) | OPHTHALMIC | 0 refills | Status: DC

## 2022-06-24 NOTE — Progress Notes (Signed)

## 2022-10-04 ENCOUNTER — Other Ambulatory Visit: Payer: Self-pay | Admitting: Physician Assistant

## 2022-10-04 ENCOUNTER — Telehealth: Payer: Self-pay | Admitting: Physician Assistant

## 2022-10-04 DIAGNOSIS — J00 Acute nasopharyngitis [common cold]: Secondary | ICD-10-CM

## 2022-10-04 DIAGNOSIS — J302 Other seasonal allergic rhinitis: Secondary | ICD-10-CM

## 2022-10-04 DIAGNOSIS — L2084 Intrinsic (allergic) eczema: Secondary | ICD-10-CM

## 2022-10-04 DIAGNOSIS — L2082 Flexural eczema: Secondary | ICD-10-CM

## 2022-10-04 MED ORDER — AZELASTINE HCL 0.1 % NA SOLN: 1.0000 | Freq: Two times a day (BID) | NASAL | 0 refills | Status: DC

## 2022-10-04 NOTE — Progress Notes (Signed)
Duplicate visit. Disregard.

## 2022-10-04 NOTE — Progress Notes (Signed)
I have spent 5 minutes in review of e-visit questionnaire, review and updating patient chart, medical decision making and response to patient.   Vale Peraza Cody Shelene Krage, PA-C    

## 2022-10-04 NOTE — Progress Notes (Signed)
E-Visit for Sinus Problems  We are sorry that you are not feeling well.  Here is how we plan to help!  Based on what you have shared with me it looks like you have sinusitis.  Sinusitis is inflammation and infection in the sinus cavities of the head.  Based on your presentation I believe you most likely have Acute Viral Sinusitis.This is an infection most likely caused by a virus. There is not specific treatment for viral sinusitis other than to help you with the symptoms until the infection runs its course.  You may use an oral decongestant such as Mucinex D or if you have glaucoma or high blood pressure use plain Mucinex. Saline nasal spray help and can safely be used as often as needed for congestion, I have prescribed: Azelastine nasal spray 2 sprays in each nostril twice a day. Start this along with your Flonase.   For the underlying allergies, I also recommend  daily OTC antihistamine -- Xyzal -- to get these under better control.  If symptoms are not turning corner over the next 48 hours (sinus pressure/pain) then please let us know and we can re-assess antibiotics.  Some authorities believe that zinc sprays or the use of Echinacea may shorten the course of your symptoms.  Sinus infections are not as easily transmitted as other respiratory infection, however we still recommend that you avoid close contact with loved ones, especially the very young and elderly.  Remember to wash your hands thoroughly throughout the day as this is the number one way to prevent the spread of infection!  Home Care: Only take medications as instructed by your medical team. Do not take these medications with alcohol. A steam or ultrasonic humidifier can help congestion.  You can place a towel over your head and breathe in the steam from hot water coming from a faucet. Avoid close contacts especially the very young and the elderly. Cover your mouth when you cough or sneeze. Always remember to wash your  hands.  Get Help Right Away If: You develop worsening fever or sinus pain. You develop a severe head ache or visual changes. Your symptoms persist after you have completed your treatment plan.  Make sure you Understand these instructions. Will watch your condition. Will get help right away if you are not doing well or get worse.   Thank you for choosing an e-visit.  Your e-visit answers were reviewed by a board certified advanced clinical practitioner to complete your personal care plan. Depending upon the condition, your plan could have included both over the counter or prescription medications.  Please review your pharmacy choice. Make sure the pharmacy is open so you can pick up prescription now. If there is a problem, you may contact your provider through [employer] and have the prescription routed to another pharmacy.  Your safety is important to [place]. If you have drug allergies check your prescription carefully.   For the next 24 hours you can use MyChart to ask questions about today's visit, request a non-urgent call back, or ask for a work or school excuse. You will get an email in the next two days asking about your experience. I hope that your e-visit has been valuable and will speed your recovery.

## 2022-12-04 ENCOUNTER — Telehealth: Payer: Medicaid Other | Admitting: Nurse Practitioner

## 2022-12-04 DIAGNOSIS — R52 Pain, unspecified: Secondary | ICD-10-CM

## 2022-12-04 NOTE — Progress Notes (Signed)
Thank you for the details you included in the comment boxes. Those details are very helpful in determining the best course of treatment for you and help [place] to provide the best care.Because your symptoms need more clarity in order to provide a diagnosis, we recommend that you convert this visit to a video visit in order for the provider to better assess what is going on.  The provider will be able to give you a more accurate diagnosis and treatment plan if we can more freely discuss your symptoms and with the addition of a virtual examination.   If you convert to a video visit, we will bill your insurance (similar to an office visit) and you will not be charged for this e-Visit. You will be able to stay at home and speak with the first available [facility] advanced practice provider. The link to do a video visit is in the drop down Menu tab of your Welcome screen in MyChart.

## 2023-01-29 ENCOUNTER — Telehealth: Payer: Medicaid Other | Admitting: Nurse Practitioner

## 2023-01-29 DIAGNOSIS — L309 Dermatitis, unspecified: Secondary | ICD-10-CM

## 2023-01-29 MED ORDER — TRIAMCINOLONE ACETONIDE 0.1 % EX CREA: 1.0000 | TOPICAL_CREAM | Freq: Two times a day (BID) | CUTANEOUS | 0 refills | Status: DC

## 2023-01-29 NOTE — Progress Notes (Signed)
E-Visit for Eczema  We are sorry that you are not feeling well. Here is how we plan to help! Based on what you shared with me it looks like you have eczema (atopic dermatitis).  Although the cause of eczema is not completely understood, genetics appear to play a strong role, and people with a family history of eczema are at increased risk of developing the condition. In most people with eczema, there is a genetic abnormality in the outermost layer of the skin, called the epidermis   Most people with eczema develop their first symptoms as children, before the age of 5. Intense itching of the skin, patches of redness, small bumps, and skin flaking are common. Scratching can further inflame the skin and worsen the itching. The itchiness may be more noticeable at nighttime.  Eczema commonly affects the back of the neck, the elbow creases, and the backs of the knees. Other affected areas may include the face, wrists, and forearms. The skin may become thickened and darkened, or even scarred, from repeated scratching. Eliminating factors that aggravate your eczema symptoms can help to control the symptoms. Possible triggers may include: ? Cold or dry environments ? Sweating ? Emotional stress or anxiety ? Rapid temperature changes ? Exposure to certain chemicals or cleaning solutions, including soaps and detergents, perfumes and cosmetics, wool or synthetic fibers, dust, sand, and cigarette smoke Keeping your skin hydrated Emollients -- Emollients are creams and ointments that moisturize the skin and prevent it from drying out. The best emollients for people with eczema are thick creams (such as Eucerin, Cetaphil, and Nutraderm) or ointments (such as petroleum jelly, Aquaphor, and Vaseline), which contain little to no water. Emollients are most effective when applied immediately after bathing. Emollients can be applied twice a day or more often if needed. Lotions contain more water than creams and  ointments and are less effective for moisturizing the skin. Bathing -- It is not clear if showers or baths are better for keeping the skin hydrated. Lukewarm baths or showers can hydrate and cool the skin, temporarily relieving itching from eczema. An unscented, mild soap or non-soap cleanser (such as Cetaphil) should be used sparingly. Apply an emollient immediately after bathing or showering to prevent your skin from drying out as a result of water evaporation. Emollient bath additives (products you add to the bath water) have not been found to help relieve symptoms. Hot or long baths (more than 10 to 15 minutes) and showers should be avoided since they can dry out the skin.  Based on what you shared with me you may have eczema.   Triamcinalone ointment (or cream). Apply to the effected areas twice per day.    I recommend dilute bleach baths for people with eczema. These baths help to decrease the number of bacteria on the skin that can cause infections or worsen symptoms. To prepare a bleach bath, one-fourth to one-half cup of bleach is placed in a full bathtub (about 40 gallons) of water. Bleach baths are usually taken for 5 to 10 minutes twice per week and should be followed by application of an emollient (listed above). I recommend you take Benadryl 25mg  - 50mg  every 4 hours to control the symptoms (including itching) but if they last over 24 hours it is best that you see an office based provider for follow up.  HOME CARE: Take lukewarm showers or baths Apply creams and ointments to prevent the skin from drying (Eucerin, Cetaphil, Nutraderm, petroleum jelly, Aquaphor or Vaseline) -  these products contain less water than other lotions and are more effective for moisturizing the skin Limit exposure to cold or dry environments, sweating, emotional stress and anxiety, rapid temperature changes and exposure to chemicals and cleaning products, soaps and detergents, perfumes, cosmetics, wool and  synthetic fibers, dust, sand and cigarette- factors which can aggravate eczema symptoms.  Use a hydrocortisone cream once or twice a day Take an antihistamine like Benadryl for widespread rashes that itch.  The adult dosage of Benadryl is 25-50 mg by mouth 4 times daily. Caution: This type of medication may cause sleepiness.  Do not drink alcohol, drive, or operate dangerous machinery while taking antihistamines.  Do not take these medications if you have prostate enlargement.  Read the package instructions thoroughly on all medications that you take.  GET HELP RIGHT AWAY IF: Symptoms that don't go away after treatment. Severe itching that persists. You develop a fever. Your skin begins to drain. You have a sore throat. You become short of breath.  MAKE SURE YOU   Understand these instructions. Will watch your condition. Will get help right away if you are not doing well or get worse.    Thank you for choosing an e-visit.  Your e-visit answers were reviewed by a board certified advanced clinical practitioner to complete your personal care plan. Depending upon the condition, your plan could have included both over the counter or prescription medications.  Please review your pharmacy choice. Make sure the pharmacy is open so you can pick up prescription now. If there is a problem, you may contact your provider through [employer] and have the prescription routed to another pharmacy.  Your safety is important to [place]. If you have drug allergies check your prescription carefully.   For the next 24 hours you can use MyChart to ask questions about today's visit, request a non-urgent call back, or ask for a work or school excuse. You will get an email in the next two days asking about your experience. I hope that your e-visit has been valuable and will speed your recovery.   Meds ordered this encounter  Medications   triamcinolone cream (KENALOG) 0.1 %    Sig: Apply 1 Application  topically 2 (two) times daily.    Dispense:  30 g    Refill:  0     I spent approximately 5 minutes reviewing the patient's history, current symptoms and coordinating their care today.

## 2023-02-01 ENCOUNTER — Telehealth: Payer: Medicaid Other | Admitting: Physician Assistant

## 2023-02-01 DIAGNOSIS — M545 Low back pain, unspecified: Secondary | ICD-10-CM

## 2023-02-01 NOTE — Progress Notes (Signed)
Because you are reporting severe back pain that radiates to the abdomen and groin area and have no previous history, I feel your condition warrants further evaluation and I recommend that you be seen in a face to face visit.   NOTE: There will be NO CHARGE for this eVisit   If you are having a true medical emergency please call 911.      For an urgent face to face visit, [place] has eight urgent care centers for your convenience:   NEW!! [facility] at [facility] Get Driving Directions [phone] [address] [place], [address]    [facility] at [facility] Get Driving Directions [phone] [address] [place], [place] [address]   [facility]) Get Driving Directions [phone] [address] [place], [place] [address]  [facility]gent Care Center [facility]) Get Driving Directions [phone] [address] [place],  [place]  [address]  [facility] Urgent Care Center [facility] - at [employer]  [phone] [address] [place],  [place] [address]   [facility] Urgent Care at [facility] Get Driving Directions [phone] [address] [place], [place] [address]   [facility] Health Urgent Care at [facility] Get Driving Directions  [phone] [address] [place], [place] [address]   [facility] Health Urgent Care at [facility] Directions [phone] [address], [address] [place], [place] [address]  Your MyChart E-visit questionnaire answers were reviewed by a board certified advanced clinical practitioner to complete your personal care plan based on your specific symptoms.  Thank you for using e-Visits.    I have spent 5 minutes in review of e-visit questionnaire, review and updating patient chart, medical decision making and  response to patient.   Margaretann Loveless, PA-C

## 2023-05-07 ENCOUNTER — Telehealth: Payer: Medicaid Other | Admitting: Physician Assistant

## 2023-05-07 DIAGNOSIS — T300 Burn of unspecified body region, unspecified degree: Secondary | ICD-10-CM

## 2023-05-07 NOTE — Progress Notes (Signed)
E-Visit for Burn  We are sorry that you are not feeling well. Here is how we plan to help!  Based on what you have shared with me it looks like you may have:  1st degree burn - usually peels like a sunburn in about a week.  The skin should look nearly normal after 2 weeks.    Burns are a type of painful wound caused by thermal, electrical, chemical, or electromagnetic energy.  Smoking and open flame are the leading cause of burn injury for older adults.  Scalding from a hot liquid is the leading cause of burn injury for children.  Both infants and older adults are the greatest risk for burn injury.  First degree burns effect only the outer layers of the skin.  The burn may be red and painful but the skin does not blister.  Long term tissue damage is rare.  Second degree burns involve the surface of the skin and the adjacent skin layers.  The burn sire also appears red and painful and the skin often swells and/or blisters.  Third degree burns destroy both layers of the skin and can also penetrate to underlying  Structures.  A third degree burn may not initially hurt because nerve endings were destroyed.  All third degree burns should be evaluated in person.  HOME CARE:  Wash the area gently with soap and water once a day Apply antibiotic ointment directly to a Band-Aid or dressing and apply Band-Aid or dressing over the burn.  Change dressing every other day.  Use warm water and 1 or 2 wipes with a wet washcloth to remove any surface debris.  Some of the newer antibiotic ointments contain lidocaine that can help to control the localized pain of the burn. You should leave intact blisters alone for the first 7 days.  After a week you may gently remove blisters.  The easiest way to do this is gently wipe away the dead skin with wet gauze or wet washcloth. If that fails you may carefully trim off the dead skin with a pair of fine scissors.  Be sure to clean the scissors in alcohol before use.  GET  HELP RIGHT AWAY IF:  The area of the burn is larger than 4 palms of our hand. You become short of breath. The site looks infected. Your symptoms persist after you have completed your treatment plan. The burn has not healed in 14 days.   MAKE SURE YOU:  Understand these instructions. Will watch your condition. Will get help right away if you are not doing well or get worse.  Your e-visit answers were reviewed by a board certified advanced clinical practitioner to complete your personal care plan.  Depending upon the condition, your plan could have included both over the counter or prescription medications.    Please review your pharmacy choice.  Make sure the pharmacy is open so you can pick up prescription now.   If there is a problem, you may contact your provider through [employer] and have the prescription routed to another pharmacy. Your safety is important to [place].  If you have drug allergies check your prescription carefully.    For the next 24 hours you can use MyChart to ask questions about today's visit, request a non-urgent call back, or ask for a work or school excuse.  You will get an email with a link to our survey asking about your experience.  I hope that your e-visit has been valuable and will  speed your recovery.     Thank you for using e-Visits.     I have spent 5 minutes in review of e-visit questionnaire, review and updating patient chart, medical decision making and response to patient.   Margaretann Loveless, PA-C

## 2023-05-12 ENCOUNTER — Telehealth: Payer: Medicaid Other | Admitting: Physician Assistant

## 2023-05-12 DIAGNOSIS — L309 Dermatitis, unspecified: Secondary | ICD-10-CM

## 2023-05-12 MED ORDER — MUPIROCIN 2 % EX OINT: 1.0000 | TOPICAL_OINTMENT | Freq: Two times a day (BID) | CUTANEOUS | 0 refills | Status: DC

## 2023-05-12 MED ORDER — TRIAMCINOLONE ACETONIDE 0.1 % EX CREA: 1.0000 | TOPICAL_CREAM | Freq: Two times a day (BID) | CUTANEOUS | 0 refills | Status: DC

## 2023-05-12 NOTE — Progress Notes (Signed)

## 2023-06-26 ENCOUNTER — Telehealth: Payer: Medicaid Other | Admitting: Physician Assistant

## 2023-06-26 DIAGNOSIS — B9789 Other viral agents as the cause of diseases classified elsewhere: Secondary | ICD-10-CM

## 2023-06-26 DIAGNOSIS — J019 Acute sinusitis, unspecified: Secondary | ICD-10-CM | POA: Diagnosis not present

## 2023-06-26 MED ORDER — PREDNISONE 10 MG (21) PO TBPK: ORAL_TABLET | ORAL | 0 refills | Status: DC

## 2023-06-26 NOTE — Progress Notes (Signed)
Message sent to patient requesting further input regarding current symptoms. Awaiting patient response.  

## 2023-06-26 NOTE — Progress Notes (Signed)
I have spent 5 minutes in review of e-visit questionnaire, review and updating patient chart, medical decision making and response to patient.   Mia Milan Cody Jacklynn Dehaas, PA-C    

## 2023-06-26 NOTE — Progress Notes (Signed)
E-Visit for Sinus Problems  We are sorry that you are not feeling well.  Here is how we plan to help!  Based on what you have shared with me it looks like you have sinusitis.  Sinusitis is inflammation and infection in the sinus cavities of the head.  Based on your presentation I believe you most likely have Acute Viral Sinusitis.This is an infection most likely caused by a virus. There is not specific treatment for viral sinusitis other than to help you with the symptoms until the infection runs its course.  You may use an oral decongestant such as Mucinex D or if you have glaucoma or high blood pressure use plain Mucinex. Saline nasal spray help and can safely be used as often as needed for congestion, I have prescribed: a prednisone tablet pack to help reduce sinus inflammation and speed up recovery.   Some authorities believe that zinc sprays or the use of Echinacea may shorten the course of your symptoms.  Sinus infections are not as easily transmitted as other respiratory infection, however we still recommend that you avoid close contact with loved ones, especially the very young and elderly.  Remember to wash your hands thoroughly throughout the day as this is the number one way to prevent the spread of infection!  Home Care: Only take medications as instructed by your medical team. Do not take these medications with alcohol. A steam or ultrasonic humidifier can help congestion.  You can place a towel over your head and breathe in the steam from hot water coming from a faucet. Avoid close contacts especially the very young and the elderly. Cover your mouth when you cough or sneeze. Always remember to wash your hands.  Get Help Right Away If: You develop worsening fever or sinus pain. You develop a severe head ache or visual changes. Your symptoms persist after you have completed your treatment plan.  Make sure you Understand these instructions. Will watch your condition. Will get  help right away if you are not doing well or get worse.   Thank you for choosing an e-visit.  Your e-visit answers were reviewed by a board certified advanced clinical practitioner to complete your personal care plan. Depending upon the condition, your plan could have included both over the counter or prescription medications.  Please review your pharmacy choice. Make sure the pharmacy is open so you can pick up prescription now. If there is a problem, you may contact your provider through [employer] and have the prescription routed to another pharmacy.  Your safety is important to [place]. If you have drug allergies check your prescription carefully.   For the next 24 hours you can use MyChart to ask questions about today's visit, request a non-urgent call back, or ask for a work or school excuse. You will get an email in the next two days asking about your experience. I hope that your e-visit has been valuable and will speed your recovery.

## 2023-10-05 ENCOUNTER — Telehealth: Payer: Self-pay | Admitting: Nurse Practitioner

## 2023-10-05 DIAGNOSIS — K047 Periapical abscess without sinus: Secondary | ICD-10-CM | POA: Diagnosis not present

## 2023-10-05 MED ORDER — IBUPROFEN 600 MG PO TABS: 600.0000 mg | ORAL_TABLET | Freq: Three times a day (TID) | ORAL | 0 refills | Status: DC | PRN

## 2023-10-05 MED ORDER — PENICILLIN V POTASSIUM 500 MG PO TABS: 500.0000 mg | ORAL_TABLET | Freq: Three times a day (TID) | ORAL | 0 refills | Status: AC

## 2023-10-05 NOTE — Progress Notes (Signed)

## 2023-10-05 NOTE — Progress Notes (Signed)
 I have spent 5 minutes in review of e-visit questionnaire, review and updating patient chart, medical decision making and response to patient.   Claiborne Rigg, NP

## 2023-10-09 ENCOUNTER — Encounter

## 2023-10-09 ENCOUNTER — Telehealth: Admitting: Family Medicine

## 2023-10-09 DIAGNOSIS — J3489 Other specified disorders of nose and nasal sinuses: Secondary | ICD-10-CM

## 2023-10-09 NOTE — Progress Notes (Signed)
 Because you just were treated with a strong antibiotic for tooth pain- it should provide some sinus coverage. I feel your condition warrants further evaluation and I recommend that you be seen in a face-to-face visit to see what is going on.   NOTE: There will be NO CHARGE for this E-Visit   If you are having a true medical emergency, please call 911.

## 2024-03-12 ENCOUNTER — Ambulatory Visit: Admitting: Family Medicine

## 2024-04-13 ENCOUNTER — Telehealth: Admitting: Family Medicine

## 2024-04-13 DIAGNOSIS — L309 Dermatitis, unspecified: Secondary | ICD-10-CM | POA: Diagnosis not present

## 2024-04-13 MED ORDER — TRIAMCINOLONE ACETONIDE 0.025 % EX OINT: 1.0000 | TOPICAL_OINTMENT | Freq: Two times a day (BID) | CUTANEOUS | 0 refills | Status: DC

## 2024-04-13 NOTE — Progress Notes (Signed)

## 2024-04-15 ENCOUNTER — Ambulatory Visit: Payer: Self-pay | Admitting: Family Medicine

## 2024-04-16 ENCOUNTER — Encounter

## 2024-04-18 ENCOUNTER — Telehealth: Admitting: Family Medicine

## 2024-04-18 DIAGNOSIS — J069 Acute upper respiratory infection, unspecified: Secondary | ICD-10-CM

## 2024-04-18 MED ORDER — BENZONATATE 100 MG PO CAPS: 100.0000 mg | ORAL_CAPSULE | Freq: Three times a day (TID) | ORAL | 0 refills | Status: AC | PRN

## 2024-04-18 NOTE — Progress Notes (Signed)
 E-Visit for Upper Respiratory Infection   We are sorry you are not feeling well.  Here is how we plan to help! Work Note provided under mychart.   Based on what you have shared with me, it looks like you may have a viral upper respiratory infection.  Upper respiratory infections are caused by a large number of viruses; however, rhinovirus is the most common cause.   Symptoms vary from person to person, with common symptoms including sore throat, cough, fatigue or lack of energy and feeling of general discomfort.  A low-grade fever of up to 100.4 may present, but is often uncommon.  Symptoms vary however, and are closely related to a person's age or underlying illnesses.  The most common symptoms associated with an upper respiratory infection are nasal discharge or congestion, cough, sneezing, headache and pressure in the ears and face.  These symptoms usually persist for about 3 to 10 days, but can last up to 2 weeks.  It is important to know that upper respiratory infections do not cause serious illness or complications in most cases.    Upper respiratory infections can be transmitted from person to person, with the most common method of transmission being a person's hands.  The virus is able to live on the skin and can infect other persons for up to 2 hours after direct contact.  Also, these can be transmitted when someone coughs or sneezes; thus, it is important to cover the mouth to reduce this risk.  To keep the spread of the illness at bay, good hand hygiene is very important.  This is an infection that is most likely caused by a virus. There are no specific treatments other than to help you with the symptoms until the infection runs its course.  We are sorry you are not feeling well.  Here is how we plan to help!   For nasal congestion, you may use an oral decongestants such as Mucinex  D or if you have glaucoma or high blood pressure use plain Mucinex .  Saline nasal spray or nasal drops can help  and can safely be used as often as needed for congestion.  If you do not have a history of heart disease, hypertension, diabetes or thyroid disease, prostate/bladder issues or glaucoma, you may also use Sudafed to treat nasal congestion.  It is highly recommended that you consult with a pharmacist or your primary care physician to ensure this medication is safe for you to take.     If you have a cough, you may use cough suppressants such as Delsym and Robitussin.  If you have glaucoma or high blood pressure, you can also use Coricidin HBP.   For cough I have prescribed for you A prescription cough medication called Tessalon  Perles 100 mg. You may take 1-2 capsules every 8 hours as needed for cough  If you have a sore or scratchy throat, use a saltwater gargle-  to  teaspoon of salt dissolved in a 4-ounce to 8-ounce glass of warm water.  Gargle the solution for approximately 15-30 seconds and then spit.  It is important not to swallow the solution.  You can also use throat lozenges/cough drops and Chloraseptic spray to help with throat pain or discomfort.  Warm or cold liquids can also be helpful in relieving throat pain.  For headache, pain or general discomfort, you can use Ibuprofen  or Tylenol  as directed.   Some authorities believe that zinc sprays or the use of Echinacea may shorten the course  of your symptoms.   HOME CARE Only take medications as instructed by your medical team. Be sure to drink plenty of fluids. Water is fine as well as fruit juices, sodas and electrolyte beverages. You may want to stay away from caffeine or alcohol. If you are nauseated, try taking small sips of liquids. How do you know if you are getting enough fluid? Your urine should be a pale yellow or almost colorless. Get rest. Taking a steamy shower or using a humidifier may help nasal congestion and ease sore throat pain. You can place a towel over your head and breathe in the steam from hot water coming from a  faucet. Using a saline nasal spray works much the same way. Cough drops, hard candies and sore throat lozenges may ease your cough. Avoid close contacts especially the very young and the elderly Cover your mouth if you cough or sneeze Always remember to wash your hands.   GET HELP RIGHT AWAY IF: You develop worsening fever. If your symptoms do not improve within 10 days You develop yellow or green discharge from your nose over 3 days. You have coughing fits You develop a severe head ache or visual changes. You develop shortness of breath, difficulty breathing or start having chest pain Your symptoms persist after you have completed your treatment plan  MAKE SURE YOU  Understand these instructions. Will watch your condition. Will get help right away if you are not doing well or get worse.  Thank you for choosing an e-visit.  Your e-visit answers were reviewed by a board certified advanced clinical practitioner to complete your personal care plan. Depending upon the condition, your plan could have included both over the counter or prescription medications.  Please review your pharmacy choice. Make sure the pharmacy is open so you can pick up prescription now. If there is a problem, you may contact your provider through [employer] and have the prescription routed to another pharmacy.  Your safety is important to us . If you have drug allergies check your prescription carefully.   For the next 24 hours you can use MyChart to ask questions about today's visit, request a non-urgent call back, or ask for a work or school excuse. You will get an email in the next two days asking about your experience. I hope that your e-visit has been valuable and will speed your recovery.   I have spent 5 minutes in review of e-visit questionnaire, review and updating patient chart, medical decision making and response to patient.   Roosvelt Mater, PA-C

## 2024-04-23 ENCOUNTER — Telehealth: Payer: Self-pay | Admitting: Family Medicine

## 2024-04-23 ENCOUNTER — Ambulatory Visit: Admitting: Family Medicine

## 2024-04-23 NOTE — Telephone Encounter (Signed)
 Called pt and left vm to call office back to reschedule missed appt

## 2024-07-09 ENCOUNTER — Telehealth: Admitting: Physician Assistant

## 2024-07-09 DIAGNOSIS — L309 Dermatitis, unspecified: Secondary | ICD-10-CM | POA: Diagnosis not present

## 2024-07-09 MED ORDER — TRIAMCINOLONE ACETONIDE 0.025 % EX OINT: 1.0000 | TOPICAL_OINTMENT | Freq: Two times a day (BID) | CUTANEOUS | 0 refills | Status: AC

## 2024-07-09 NOTE — Progress Notes (Signed)

## 2024-08-01 ENCOUNTER — Telehealth: Admitting: Family Medicine

## 2024-08-01 DIAGNOSIS — R112 Nausea with vomiting, unspecified: Secondary | ICD-10-CM | POA: Diagnosis not present

## 2024-08-01 MED ORDER — PROMETHAZINE HCL 25 MG PO TABS: 25.0000 mg | ORAL_TABLET | Freq: Two times a day (BID) | ORAL | 0 refills | Status: AC

## 2024-08-01 NOTE — Progress Notes (Signed)
 E-Visit for Nausea and Vomiting   We are sorry that you are not feeling well. Here is how we plan to help!  Based on what you have shared with me it looks like you have a Virus that is irritating your GI tract.  Vomiting is the forceful emptying of a portion of the stomach's content through the mouth.  Although nausea and vomiting can make you feel miserable, it's important to remember that these are not diseases, but rather symptoms of an underlying illness.  When we treat short term symptoms, we always caution that any symptoms that persist should be fully evaluated in a medical office.  I have prescribed a medication that will help alleviate your symptoms and allow you to stay hydrated:  Promethazine  25 mg take 1 tablet twice daily If you believe you are pregnant, please follow up with PCP as soon as possible.   HOME CARE: Drink clear liquids.  This is very important! Dehydration (the lack of fluid) can lead to a serious complication.  Start off with 1 tablespoon every 5 minutes for 8 hours. You may begin eating bland foods after 8 hours without vomiting.  Start with saltine crackers, white bread, rice, mashed potatoes, applesauce. After 48 hours on a bland diet, you may resume a normal diet. Try to go to sleep.  Sleep often empties the stomach and relieves the need to vomit.  GET HELP RIGHT AWAY IF:  Your symptoms do not improve or worsen within 2 days after treatment. You have a fever for over 3 days. You cannot keep down fluids after trying the medication.  MAKE SURE YOU:  Understand these instructions. Will watch your condition. Will get help right away if you are not doing well or get worse.    Thank you for choosing an e-visit.  Your e-visit answers were reviewed by a board certified advanced clinical practitioner to complete your personal care plan. Depending upon the condition, your plan could have included both over the counter or prescription medications.  Please review  your pharmacy choice. Make sure the pharmacy is open so you can pick up prescription now. If there is a problem, you may contact your provider through [employer] and have the prescription routed to another pharmacy.  Your safety is important to us . If you have drug allergies check your prescription carefully.   For the next 24 hours you can use MyChart to ask questions about today's visit, request a non-urgent call back, or ask for a work or school excuse. You will get an email in the next two days asking about your experience. I hope that your e-visit has been valuable and will speed your recovery.  I have spent 5 minutes in review of e-visit questionnaire, review and updating patient chart, medical decision making and response to patient.   Roosvelt Mater, PA-C

## 2024-08-02 ENCOUNTER — Telehealth: Admitting: Family

## 2024-08-02 DIAGNOSIS — R6889 Other general symptoms and signs: Secondary | ICD-10-CM

## 2024-08-02 DIAGNOSIS — R6883 Chills (without fever): Secondary | ICD-10-CM

## 2024-08-02 DIAGNOSIS — M791 Myalgia, unspecified site: Secondary | ICD-10-CM

## 2024-08-02 DIAGNOSIS — R112 Nausea with vomiting, unspecified: Secondary | ICD-10-CM

## 2024-08-02 MED ORDER — OSELTAMIVIR PHOSPHATE 75 MG PO CAPS: 75.0000 mg | ORAL_CAPSULE | Freq: Two times a day (BID) | ORAL | 0 refills | Status: AC

## 2024-08-02 MED ORDER — FLUTICASONE PROPIONATE 50 MCG/ACT NA SUSP: 2.0000 | Freq: Every day | NASAL | 6 refills | Status: AC

## 2024-08-02 MED ORDER — BENZONATATE 100 MG PO CAPS: 100.0000 mg | ORAL_CAPSULE | Freq: Two times a day (BID) | ORAL | 0 refills | Status: AC | PRN

## 2024-08-02 MED ORDER — ONDANSETRON HCL 4 MG PO TABS: 4.0000 mg | ORAL_TABLET | Freq: Three times a day (TID) | ORAL | 0 refills | Status: AC | PRN

## 2024-08-02 NOTE — Progress Notes (Signed)
 E visit for Flu like symptoms   We are sorry that you are not feeling well.  Here is how we plan to help! Based on what you have shared with me it looks like you may have a respiratory virus that may be influenza.  Influenza or the flu is  an infection caused by a respiratory virus. The flu virus is highly contagious and persons who did not receive their yearly flu vaccination may catch the flu from close contact.  We have anti-viral medications to treat the viruses that cause this infection. They are not a cure and only shorten the course of the infection. These prescriptions are most effective when they are given within the first 2 days of flu symptoms. Antiviral medications are indicated if you have a high risk of complications from the flu. You should  also consider an antiviral medication if you are in close contact with someone who is at risk. These medications can help patients avoid complications from the flu but have side effects that you should know.   Possible side effects from Tamiflu  or oseltamivir  include nausea, vomiting, diarrhea, dizziness, headaches, eye redness, sleep problems or other respiratory symptoms. You should not take Tamiflu  if you have an allergy to oseltamivir  or any to the ingredients in Tamiflu .  Based upon your symptoms and potential risk factors I have prescribed Oseltamivir  (Tamiflu ).  It has been sent to your designated pharmacy.  You will take one 75 mg capsule orally twice a day for the next 5 days.   For nasal congestion, you may use an oral decongestant such as Mucinex  D or if you have glaucoma or high blood pressure use plain Mucinex .  Saline nasal spray or nasal drops can help and can safely be used as often as needed for congestion.  If you have a sore or scratchy throat, use a saltwater gargle-  to  teaspoon of salt dissolved in a 4-ounce to 8-ounce glass of warm water.  Gargle the solution for approximately 15-30 seconds and then spit.  It is  important not to swallow the solution.  You can also use throat lozenges/cough drops and Chloraseptic spray to help with throat pain or discomfort.  Warm or cold liquids can also be helpful in relieving throat pain.  For headache, pain or general discomfort, you can use Ibuprofen  or Tylenol  as directed.   Some authorities believe that zinc sprays or the use of Echinacea may shorten the course of your symptoms.  I have prescribed the following medications to help lessen symptoms: I have prescribed Tessalon  Perles 100 mg. You may take 1-2 capsules every 8 hours as needed for cough, I have prescribed Fluticasone  nasal spray 2 sprays in each nostril one time per dayasal spray 2 sprays in each nostril one time per day, and I have prescribed Zofran  4 mg tablets 1 every 6 hours if needed for nausea  You are to isolate at home until you have been fever-free for at least 24 hours without a fever-reducing medication, and symptoms have been steadily improving for 24 hours.  If you must be around other household members who do not have symptoms, you need to make sure that both you and the family members are masking consistently with a high-quality mask.  If you note any worsening of symptoms despite treatment, please seek an in-person evaluation ASAP. If you note any significant shortness of breath or any chest pain, please seek ED evaluation. Please do not delay care!  ANYONE WHO HAS FLU  SYMPTOMS SHOULD: Stay home. The flu is highly contagious and going out or to work exposes others! Be sure to drink plenty of fluids. Water is fine as well as fruit juices, sodas and electrolyte beverages. You may want to stay away from caffeine or alcohol. If you are nauseated, try taking small sips of liquids. How do you know if you are getting enough fluid? Your urine should be a pale yellow or almost colorless. Get rest. Taking a steamy shower or using a humidifier may help nasal congestion and ease sore throat pain. Using a  saline nasal spray works much the same way. Cough drops, hard candies and sore throat lozenges may ease your cough. Line up a caregiver. Have someone check on you regularly.  GET HELP RIGHT AWAY IF: You cannot keep down liquids or your medications. You become short of breath Your fell like you are going to pass out or loose consciousness. Your symptoms persist after you have completed your treatment plan  MAKE SURE YOU  Understand these instructions. Will watch your condition. Will get help right away if you are not doing well or get worse.  Your e-visit answers were reviewed by a board certified advanced clinical practitioner to complete your personal care plan.  Depending on the condition, your plan could have included both over the counter or prescription medications.  If there is a problem please reply  once you have received a response from your provider.  Your safety is important to us .  If you have drug allergies check your prescription carefully.    You can use MyChart to ask questions about todays visit, request a non-urgent call back, or ask for a work or school excuse for 24 hours related to this e-Visit. If it has been greater than 24 hours you will need to follow up with your provider, or enter a new e-Visit to address those concerns.  You will get an e-mail in the next two days asking about your experience.  I hope that your e-visit has been valuable and will speed your recovery. Thank you for using e-visits.   I have spent 5 minutes in review of e-visit questionnaire, review and updating patient chart, medical decision making and response to patient.   Bari Learn, FNP

## 2024-08-03 ENCOUNTER — Other Ambulatory Visit: Payer: Self-pay

## 2024-08-03 ENCOUNTER — Encounter (HOSPITAL_COMMUNITY): Payer: Self-pay

## 2024-08-03 ENCOUNTER — Emergency Department (HOSPITAL_COMMUNITY)
Admission: EM | Admit: 2024-08-03 | Discharge: 2024-08-03 | Disposition: A | Discharge status: Home / Self Care | Attending: Emergency Medicine | Admitting: Emergency Medicine

## 2024-08-03 DIAGNOSIS — J101 Influenza due to other identified influenza virus with other respiratory manifestations: Secondary | ICD-10-CM | POA: Insufficient documentation

## 2024-08-03 DIAGNOSIS — J111 Influenza due to unidentified influenza virus with other respiratory manifestations: Secondary | ICD-10-CM

## 2024-08-03 DIAGNOSIS — R059 Cough, unspecified: Secondary | ICD-10-CM | POA: Diagnosis present

## 2024-08-03 DIAGNOSIS — K047 Periapical abscess without sinus: Secondary | ICD-10-CM

## 2024-08-03 LAB — URINALYSIS, ROUTINE W REFLEX MICROSCOPIC
Bilirubin Urine: NEGATIVE
Glucose, UA: NEGATIVE mg/dL
Hgb urine dipstick: NEGATIVE
Ketones, ur: 80 mg/dL — AB
Leukocytes,Ua: NEGATIVE
Nitrite: NEGATIVE
Protein, ur: 30 mg/dL — AB
Specific Gravity, Urine: 1.03 (ref 1.005–1.030)
pH: 5 (ref 5.0–8.0)

## 2024-08-03 LAB — CBC WITH DIFFERENTIAL/PLATELET
Abs Immature Granulocytes: 0.01 K/uL (ref 0.00–0.07)
Basophils Absolute: 0 K/uL (ref 0.0–0.1)
Basophils Relative: 0 %
Eosinophils Absolute: 0 K/uL (ref 0.0–0.5)
Eosinophils Relative: 0 %
HCT: 42.5 % (ref 36.0–46.0)
Hemoglobin: 14.1 g/dL (ref 12.0–15.0)
Immature Granulocytes: 0 %
Lymphocytes Relative: 20 %
Lymphs Abs: 0.8 K/uL (ref 0.7–4.0)
MCH: 29.7 pg (ref 26.0–34.0)
MCHC: 33.2 g/dL (ref 30.0–36.0)
MCV: 89.5 fL (ref 80.0–100.0)
Monocytes Absolute: 0.6 K/uL (ref 0.1–1.0)
Monocytes Relative: 16 %
Neutro Abs: 2.6 K/uL (ref 1.7–7.7)
Neutrophils Relative %: 64 %
Platelets: 227 K/uL (ref 150–400)
RBC: 4.75 MIL/uL (ref 3.87–5.11)
RDW: 13.2 % (ref 11.5–15.5)
WBC: 4.1 K/uL (ref 4.0–10.5)
nRBC: 0 % (ref 0.0–0.2)

## 2024-08-03 LAB — COMPREHENSIVE METABOLIC PANEL WITH GFR
ALT: 18 U/L (ref 0–44)
AST: 26 U/L (ref 15–41)
Albumin: 4.3 g/dL (ref 3.5–5.0)
Alkaline Phosphatase: 43 U/L (ref 38–126)
Anion gap: 15 (ref 5–15)
BUN: 15 mg/dL (ref 6–20)
CO2: 20 mmol/L — ABNORMAL LOW (ref 22–32)
Calcium: 9 mg/dL (ref 8.9–10.3)
Chloride: 102 mmol/L (ref 98–111)
Creatinine, Ser: 0.95 mg/dL (ref 0.44–1.00)
GFR, Estimated: >60 mL/min
Glucose, Bld: 71 mg/dL (ref 70–99)
Potassium: 4 mmol/L (ref 3.5–5.1)
Sodium: 137 mmol/L (ref 135–145)
Total Bilirubin: 0.3 mg/dL (ref 0.0–1.2)
Total Protein: 7.7 g/dL (ref 6.5–8.1)

## 2024-08-03 LAB — LIPASE, BLOOD: Lipase: 22 U/L (ref 11–51)

## 2024-08-03 LAB — RESP PANEL BY RT-PCR (RSV, FLU A&B, COVID)  RVPGX2
Influenza A by PCR: NEGATIVE
Influenza B by PCR: POSITIVE — AB
Resp Syncytial Virus by PCR: NEGATIVE
SARS Coronavirus 2 by RT PCR: NEGATIVE

## 2024-08-03 LAB — HCG, SERUM, QUALITATIVE: Preg, Serum: NEGATIVE

## 2024-08-03 LAB — CK: Total CK: 73 U/L (ref 38–234)

## 2024-08-03 MED ORDER — ONDANSETRON 4 MG PO TBDP
4.0000 mg | ORAL_TABLET | Freq: Once | ORAL | Status: AC
  Administered 2024-08-03: 4 mg via ORAL
  Filled 2024-08-03: qty 1

## 2024-08-03 MED ORDER — IBUPROFEN 600 MG PO TABS: 600.0000 mg | ORAL_TABLET | Freq: Four times a day (QID) | ORAL | 0 refills | Status: DC | PRN

## 2024-08-03 MED ORDER — IBUPROFEN 600 MG PO TABS: 600.0000 mg | ORAL_TABLET | Freq: Three times a day (TID) | ORAL | 0 refills | Status: AC | PRN

## 2024-08-03 NOTE — ED Provider Triage Note (Signed)
 Emergency Medicine Provider Triage Evaluation Note  Natasha Martinez , a 29 y.o. female  was evaluated in triage.  Pt complains of flulike symptoms with runny nose, nasal congestion, body aches, nausea, vomiting.  Unrelieved from nausea medication.  Denies any abdominal pain.  No known fevers.  Denies sick contacts.  Denies any urinary symptoms.  Review of Systems  Positive:  Negative:   Physical Exam  BP (!) 117/91 (BP Location: Right Arm)   Pulse 83   Temp 98.7 F (37.1 C) (Oral)   Resp 18   Ht 5' 3 (1.6 m)   Wt 56.7 kg   SpO2 100%   BMI 22.14 kg/m  Gen:   Awake, no distress   Resp:  Normal effort  MSK:   Moves extremities without difficulty  Other:  Abdomen soft and non tender  Medical Decision Making  Medically screening exam initiated at 2:23 PM.  Appropriate orders placed.  Natasha Martinez was informed that the remainder of the evaluation will be completed by another provider, this initial triage assessment does not replace that evaluation, and the importance of remaining in the ED until their evaluation is complete.  Labs ordered.    Natasha Ernst, PA-C 08/03/24 1424

## 2024-08-03 NOTE — Discharge Instructions (Signed)
 You were seen in the ER today for evaluation of your symptoms. You tested positive for the flu. You have some dehydration, please make sure that you are drinking plenty of fluids and staying well hydrated. You can take 1-2 tables of the nausea medication you were given every 8 hours as needed. Make sure that you are resting. For body aches, you can take 1000mg  of Tylenol  and/or 600mg  of ibuprofen  every 6 hours as needed for pain. Be careful with taking over the counter cough and cold medication if this already contains Tylenol . I have included some additional information for you to review. If you have any concerns, new or worsening symptoms, please return to the ER for re-evaluation.   Contact a health care provider if: You get new symptoms. You have chest pain. You have watery poop, also called diarrhea. You have a fever. Your cough gets worse. You start to have more mucus. You feel like you may vomit, or you vomit. Get help right away if: You become short of breath or have trouble breathing. Your skin or nails turn blue. You have very bad pain or stiffness in your neck. You get a sudden headache or pain in your face or ear. You vomit each time you eat or drink. These symptoms may be an emergency. Call 911 right away. Do not wait to see if the symptoms will go away. Do not drive yourself to the hospital.

## 2024-08-03 NOTE — ED Provider Notes (Signed)
 " [facility] Provider Note   CSN: [id number] Arrival date & time: 08/03/24  1332     Patient presents with: Emesis   Natasha Martinez is a 29 y.o. female  Pt complains of flulike symptoms with runny nose, nasal congestion, body aches, nausea, vomiting.  Unrelieved from nausea medication.  Denies any abdominal pain.  Denies any urinary symptoms.  Denies any chest pain or shortness of breath.  No known fevers.  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  Denies any urinary symptoms.    Emesis Associated symptoms: cough and myalgias   Associated symptoms: no abdominal pain, no chills and no fever        Prior to Admission medications  Medication Sig Start Date End Date Taking? Authorizing Provider  albuterol  (VENTOLIN  HFA) 108 (90 Base) MCG/ACT inhaler Inhale 2 puffs into the lungs every 6 (six) hours as needed for wheezing or shortness of breath. 02/22/21   Gladis, Mary-Margaret, FNP  benzonatate  (TESSALON ) 100 MG capsule Take 1 capsule (100 mg total) by mouth 2 (two) times daily as needed for cough. 08/02/24   Lavell Bari LABOR, FNP  fluticasone  (FLONASE ) 50 MCG/ACT nasal spray Place 2 sprays into both nostrils daily. 08/02/24   Lavell Bari LABOR, FNP  ibuprofen  (ADVIL ) 600 MG tablet Take 1 tablet (600 mg total) by mouth every 8 (eight) hours as needed. 10/05/23   Fleming, Zelda W, NP  ondansetron  (ZOFRAN ) 4 MG tablet Take 1 tablet (4 mg total) by mouth every 8 (eight) hours as needed for nausea or vomiting. 08/02/24   Lavell Bari LABOR, FNP  oseltamivir  (TAMIFLU ) 75 MG capsule Take 1 capsule (75 mg total) by mouth 2 (two) times daily. 08/02/24   Lavell Bari LABOR, FNP  promethazine  (PHENERGAN ) 25 MG tablet Take 1 tablet (25 mg total) by mouth every 12 (twelve) hours. 08/01/24 08/08/24  Reyes, Shaylo, PA-C  triamcinolone  (KENALOG ) 0.025 % ointment Apply 1 Application topically 2 (two) times daily. 07/09/24   Zohra, Mobeen, PA-C  enalapril   (VASOTEC ) 5 MG tablet Take 1 tablet (5 mg total) by mouth daily. 11/10/18 03/24/19  Anyanwu, Ugonna A, MD  ferrous sulfate  325 (65 FE) MG tablet Take 1 tablet (325 mg total) by mouth every other day. 09/15/18 03/24/19  Weinhold, Samantha C, CNM  NIFEdipine  (PROCARDIA -XL/NIFEDICAL-XL) 30 MG 24 hr tablet Take 1 tablet (30 mg total) by mouth daily. 11/11/18 03/24/19  Synthia Harlene, CNM  norgestimate -ethinyl estradiol  (ORTHO-CYCLEN) 0.25-35 MG-MCG tablet Take 1 tablet by mouth daily. 12/11/18 03/24/19  Marylen Aleck HERO, CNM    Allergies: Patient has no known allergies.    Review of Systems  Constitutional:  Negative for chills and fever.  HENT:  Positive for congestion and rhinorrhea.   Respiratory:  Positive for cough. Negative for shortness of breath.   Cardiovascular:  Negative for chest pain.  Gastrointestinal:  Positive for nausea and vomiting. Negative for abdominal pain.  Genitourinary:  Negative for dysuria and hematuria.  Musculoskeletal:  Positive for myalgias. Negative for neck pain and neck stiffness.    Updated Vital Signs BP 117/79 (BP Location: Right Arm)   Pulse 71   Temp 99.2 F (37.3 C)   Resp 17   Ht 5' 3 (1.6 m)   Wt 56.7 kg   SpO2 100%   BMI 22.14 kg/m   Physical Exam Vitals and nursing note reviewed.  Constitutional:      General: She is not in acute distress.  Appearance: She is not toxic-appearing.     Comments: Uncomfortable, but nontoxic-appearing  HENT:     Head: Normocephalic and atraumatic.     Nose:     Comments: Bilateral nasal turbinate edema and erythema with scant clear nasal discharge.    Mouth/Throat:     Mouth: Mucous membranes are moist.     Comments: No pharyngeal erythema, exudate, or edema noted.  Uvula midline.  Airway patent.  Moist mucous membranes. Eyes:     General: No scleral icterus.    Conjunctiva/sclera: Conjunctivae normal.  Cardiovascular:     Rate and Rhythm: Normal rate.  Pulmonary:     Effort: Pulmonary effort is normal. No  respiratory distress.     Breath sounds: Normal breath sounds. No wheezing.  Abdominal:     Palpations: Abdomen is soft.     Tenderness: There is no abdominal tenderness. There is no guarding or rebound.  Musculoskeletal:     Cervical back: Normal range of motion.  Skin:    General: Skin is warm and dry.  Neurological:     General: No focal deficit present.     Mental Status: She is alert.     (all labs ordered are listed, but only abnormal results are displayed) Labs Reviewed  RESP PANEL BY RT-PCR (RSV, FLU A&B, COVID)  RVPGX2 - Abnormal; Notable for the following components:      Result Value   Influenza B by PCR POSITIVE (*)    All other components within normal limits  COMPREHENSIVE METABOLIC PANEL WITH GFR - Abnormal; Notable for the following components:   CO2 20 (*)    All other components within normal limits  URINALYSIS, ROUTINE W REFLEX MICROSCOPIC - Abnormal; Notable for the following components:   APPearance HAZY (*)    Ketones, ur 80 (*)    Protein, ur 30 (*)    Bacteria, UA RARE (*)    All other components within normal limits  CBC WITH DIFFERENTIAL/PLATELET  LIPASE, BLOOD  HCG, SERUM, QUALITATIVE  CK    EKG: None  Radiology: No results found.  Procedures   Medications Ordered in the ED  ondansetron  (ZOFRAN -ODT) disintegrating tablet 4 mg (4 mg Oral Given 08/03/24 1432)                                Medical Decision Making Amount and/or Complexity of Data Reviewed Labs: ordered.  Risk Prescription drug management.   29 y.o. female presents to the ER for evaluation of flulike symptoms with nausea vomiting. Differential diagnosis includes but is not limited to viral illness, COVID, flu, RSV, bronchitis, PNA. Vital signs unremarkable. Physical exam as noted above.   On pre-chart evaluation, patient seen via telehealth given Zofran  for flulike symptoms as well as some Tamiflu .  Will give her Zofran  here and reevaluate.  Abdomen is soft and  nontender, do not think abdominal imaging is needed at this time.  I independently reviewed and interpreted the patient's labs.  hCG is negative.  CK within normal limits.  CBC without leukocytosis or anemia.  Lipase within normal limits.  CMP shows bicarb of 20 otherwise no electrolyte or LFT abnormality.  Urinalysis shows hazy urine with 80 ketones and 30 protein with rare bacteria but 6-10 squamous epithelials present.  Not having any urinary symptoms.  Doubt UTI.  Patient positive for flu B.  On reevaluation after Zofran , patient reports she is feeling better.  Has tolerated p.o.  here without any difficulty.  She already has work note from telehealth.  Advised her to stick with the medications given to her by the telemedicine.  Told she can take 1-2 of the Zofran  tablets for nausea or vomiting.  Discussed that she needs to go home and have oral rehydration dealt with her symptoms as well.  Again abdomen is reassessed and is soft and nontender.  Do not think she needs any additional imaging.  Lung sounds are clear to auscultation and she is satting well on room air without any increased work of breathing.  Will discharge home with strict return precautions.  We discussed the results of the labs/imaging. The plan is take medication as prescribed, supportive care. We discussed strict return precautions and red flag symptoms. The patient verbalized their understanding and agrees to the plan. The patient is stable and being discharged home in good condition.  Portions of this report may have been transcribed using voice recognition software. Every effort was made to ensure accuracy; however, inadvertent computerized transcription errors may be present.    Final diagnoses:  Flu    ED Discharge Orders          Ordered    ibuprofen  (ADVIL ) 600 MG tablet  Every 6 hours PRN,   Status:  Discontinued        08/03/24 2102    ibuprofen  (ADVIL ) 600 MG tablet  Every 8 hours PRN        08/03/24 2102                Bernis Ernst, PA-C 08/03/24 2221    Darra Fonda MATSU, MD 08/03/24 2254  "

## 2024-08-03 NOTE — ED Triage Notes (Signed)
 Pt came in via POV d/t 3 days ago began vomiting & has been feeling very nauseous with intermittent vomiting since it began. A/Ox4, 10/10 generalized body aches.
